# Patient Record
Sex: Female | Born: 1940 | Race: White | Hispanic: No | Smoking: Former smoker
Health system: Southern US, Community
[De-identification: ages and names within clinical notes are randomized; demographics above are authoritative.]

## PROBLEM LIST (undated history)

## (undated) DIAGNOSIS — F329 Major depressive disorder, single episode, unspecified: Secondary | ICD-10-CM

## (undated) DIAGNOSIS — F32A Depression, unspecified: Secondary | ICD-10-CM

## (undated) DIAGNOSIS — B019 Varicella without complication: Secondary | ICD-10-CM

## (undated) DIAGNOSIS — R32 Unspecified urinary incontinence: Secondary | ICD-10-CM

## (undated) DIAGNOSIS — K219 Gastro-esophageal reflux disease without esophagitis: Secondary | ICD-10-CM

## (undated) DIAGNOSIS — E7800 Pure hypercholesterolemia, unspecified: Secondary | ICD-10-CM

## (undated) DIAGNOSIS — M1990 Unspecified osteoarthritis, unspecified site: Secondary | ICD-10-CM

## (undated) DIAGNOSIS — G2581 Restless legs syndrome: Secondary | ICD-10-CM

## (undated) DIAGNOSIS — N390 Urinary tract infection, site not specified: Secondary | ICD-10-CM

## (undated) DIAGNOSIS — G4730 Sleep apnea, unspecified: Secondary | ICD-10-CM

## (undated) HISTORY — DX: Depression, unspecified: F32.A

## (undated) HISTORY — DX: Sleep apnea, unspecified: G47.30

## (undated) HISTORY — DX: Unspecified osteoarthritis, unspecified site: M19.90

## (undated) HISTORY — DX: Restless legs syndrome: G25.81

## (undated) HISTORY — DX: Urinary tract infection, site not specified: N39.0

## (undated) HISTORY — DX: Varicella without complication: B01.9

## (undated) HISTORY — DX: Major depressive disorder, single episode, unspecified: F32.9

## (undated) HISTORY — DX: Pure hypercholesterolemia, unspecified: E78.00

## (undated) HISTORY — DX: Unspecified urinary incontinence: R32

## (undated) HISTORY — DX: Gastro-esophageal reflux disease without esophagitis: K21.9

---
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
## 2004-07-26 LAB — HM PAP SMEAR

## 2010-02-25 HISTORY — PX: COLONOSCOPY: SHX174

## 2011-03-17 LAB — HM COLONOSCOPY

## 2011-07-27 LAB — HM MAMMOGRAPHY

## 2012-12-14 DIAGNOSIS — H2510 Age-related nuclear cataract, unspecified eye: Secondary | ICD-10-CM | POA: Diagnosis not present

## 2012-12-14 DIAGNOSIS — H269 Unspecified cataract: Secondary | ICD-10-CM | POA: Diagnosis not present

## 2012-12-15 DIAGNOSIS — H2510 Age-related nuclear cataract, unspecified eye: Secondary | ICD-10-CM | POA: Diagnosis not present

## 2013-01-04 DIAGNOSIS — H269 Unspecified cataract: Secondary | ICD-10-CM | POA: Diagnosis not present

## 2013-01-04 DIAGNOSIS — H2510 Age-related nuclear cataract, unspecified eye: Secondary | ICD-10-CM | POA: Diagnosis not present

## 2013-01-05 DIAGNOSIS — N390 Urinary tract infection, site not specified: Secondary | ICD-10-CM | POA: Diagnosis not present

## 2013-05-03 ENCOUNTER — Encounter: Payer: Self-pay | Admitting: Family Medicine

## 2013-05-03 ENCOUNTER — Ambulatory Visit (INDEPENDENT_AMBULATORY_CARE_PROVIDER_SITE_OTHER): Payer: Medicare Other | Admitting: Family Medicine

## 2013-05-03 VITALS — BP 150/88 | Temp 98.9°F | Ht 65.75 in | Wt 164.0 lb

## 2013-05-03 DIAGNOSIS — Z7189 Other specified counseling: Secondary | ICD-10-CM

## 2013-05-03 DIAGNOSIS — K219 Gastro-esophageal reflux disease without esophagitis: Secondary | ICD-10-CM | POA: Insufficient documentation

## 2013-05-03 DIAGNOSIS — Z7689 Persons encountering health services in other specified circumstances: Secondary | ICD-10-CM

## 2013-05-03 DIAGNOSIS — G2581 Restless legs syndrome: Secondary | ICD-10-CM | POA: Diagnosis not present

## 2013-05-03 DIAGNOSIS — M25559 Pain in unspecified hip: Secondary | ICD-10-CM

## 2013-05-03 DIAGNOSIS — M25552 Pain in left hip: Secondary | ICD-10-CM

## 2013-05-03 NOTE — Patient Instructions (Addendum)
-  We have ordered an xray of you hip at this visit. It can take up to 1-2 weeks for results and processing. We will contact you with instructions IF your results are abnormal. Normal results will be released to [facility]. If you have not heard from us or can not find your results in [facility] in 2 weeks please contact our office.  -PLEASE SIGN UP FOR MYCHART TODAY   We recommend the following healthy lifestyle measures: - eat a healthy diet consisting of lots of vegetables, fruits, beans, nuts, seeds, healthy meats such as white chicken and fish and whole grains.  - avoid fried foods, fast food, processed foods, sodas, red meet and other fattening foods.  - get a least 150 minutes of aerobic exercise per week.   Follow up in: the next 3 months for a medicare wellness exam and fasting labs that day

## 2013-05-03 NOTE — Progress Notes (Signed)
Pre visit review using our clinic review tool, if applicable. No additional management support is needed unless otherwise documented below in the visit note. 

## 2013-05-03 NOTE — Progress Notes (Signed)
Chief Complaint  Patient presents with  . Establish Care    HPI:  Christina Guerrero is here to establish care. Recently moved here from [facility]. Last PCP and physical:  Has the following chronic problems and concerns today:  Patient Active Problem List   Diagnosis Date Noted  . GERD (gastroesophageal reflux disease) 05/03/2013  . Restless leg 05/03/2013   L Hip Pain: -5 months ago after fell out of bed and hurt immediately after and has persisted though somewhat better -wants xray of this -denies inability to bear weight, weakness, numbness, fevers, malaise, weight loss, bowel or bladder changes  Restless leg syndrome: -ropinirole -stable on this for some time  GERD: -takes chronically -stable as long as on medication  Anxiety and Depression: -wants recs for psych on this -stable on effexor and ativan  Health Maintenance:  ROS: See pertinent positives and negatives per HPI.  Past Medical History  Diagnosis Date  . Arthritis   . Chicken pox   . Depression   . GERD (gastroesophageal reflux disease)   . High cholesterol   . Urine incontinence   . UTI (urinary tract infection)   . Restless leg syndrome   . Sleep apnea     borderline    Family History  Problem Relation Age of Onset  . Hyperlipidemia Mother   . Hypertension Mother   . Mental illness Mother   . Stroke Mother   . Thyroid disease Mother   . Kidney disease Father   . Mental illness Father   . Alcoholism Father   . Hepatitis Father   . Sudden death Father   . Stroke Maternal Grandfather     History   Social History  . Marital Status: Married    Spouse Name: N/A    Number of Children: N/A  . Years of Education: N/A   Social History Main Topics  . Smoking status: Never Smoker   . Smokeless tobacco: None  . Alcohol Use: Yes     Comment: glass of wine once per day  . Drug Use: None  . Sexual Activity: None   Other Topics Concern  . 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 One in am, pm and 2 at bedtime., Disp: , Rfl: ;  omeprazole (PRILOSEC) 20 MG capsule, Take 20 mg by mouth daily., Disp: , Rfl: ;  rOPINIRole (REQUIP) 0.5 MG tablet, Take 0.5 mg by mouth at bedtime. , Disp: , Rfl: ;  venlafaxine XR (EFFEXOR-XR) 150 MG 24 hr capsule, Take 150 mg by mouth 2 (two) times daily. , Disp: , Rfl:   EXAM:  Filed Vitals:   05/03/13 1125  BP: 150/88  Temp: 98.9 F (37.2 C)    Body mass index is 26.67 kg/(m^2).  GENERAL: vitals reviewed and listed above, alert, oriented, appears well hydrated and in no acute distress  HEENT: atraumatic, conjunttiva clear, no obvious abnormalities on inspection of external nose and ears  NECK: no obvious masses on inspection  LUNGS: clear to auscultation bilaterally, no wheezes, rales or rhonchi, good air movement  CV: HRRR, no peripheral edema  MS: moves all extremities without noticeable abnormality, normal  gait, TTP L post GT  PSYCH: pleasant and cooperative, no obvious depression or anxiety  ASSESSMENT AND PLAN:  Discussed the following assessment and plan:  Hip pain, left - Plan: DG Hip Complete Left  Encounter to establish care  GERD (gastroesophageal reflux disease)  Restless leg  -We reviewed the PMH, PSH, FH, SH, Meds and Allergies. -We provided refills for any medications we will prescribe as needed. -We addressed current concerns per orders and patient instructions. -We have asked for records for pertinent exams, studies, vaccines and notes from previous providers. -We have advised patient to follow up per instructions below. -she will see psych for psych medications -xray L hip and offered referral to ortho - pending results -follow up for  physical  -Patient advised to return or notify a doctor immediately if symptoms worsen or persist or new concerns arise.  Patient Instructions  -We have ordered labs or studies at this visit. It can take up to 1-2 weeks for results and processing. We will contact you with instructions IF your results are abnormal. Normal results will be [facility]. If you have not heard from us or can not find your results in [facility] in 2 weeks please contact our office.  -PLEASE SIGN UP FOR MYCHART TODAY   We recommend the following healthy lifestyle measures: - eat a healthy diet consisting of lots of vegetables, fruits, beans, nuts, seeds, healthy meats such as white chicken and fish and whole grains.  - avoid fried foods, fast food, processed foods, sodas, red meet and other fattening foods.  - get a least 150 minutes of aerobic exercise per week.   Follow up in: the next 3 months for a medicare wellness exam and fasting labs that day      Shambhavi Salley, Dahlia ClientHANNAH R.

## 2013-05-07 ENCOUNTER — Ambulatory Visit (INDEPENDENT_AMBULATORY_CARE_PROVIDER_SITE_OTHER)
Admission: RE | Admit: 2013-05-07 | Discharge: 2013-05-07 | Disposition: A | Discharge status: Home / Self Care | Payer: Medicare Other | Source: Ambulatory Visit | Attending: Family Medicine | Admitting: Family Medicine

## 2013-05-07 DIAGNOSIS — M25559 Pain in unspecified hip: Secondary | ICD-10-CM

## 2013-05-07 DIAGNOSIS — S79919A Unspecified injury of unspecified hip, initial encounter: Secondary | ICD-10-CM | POA: Diagnosis not present

## 2013-05-07 DIAGNOSIS — M25552 Pain in left hip: Secondary | ICD-10-CM

## 2013-05-10 ENCOUNTER — Telehealth: Payer: Self-pay | Admitting: Family Medicine

## 2013-05-10 DIAGNOSIS — M25559 Pain in unspecified hip: Secondary | ICD-10-CM

## 2013-05-10 NOTE — Telephone Encounter (Signed)
Noted  

## 2013-05-10 NOTE — Telephone Encounter (Signed)
Message copied by Terressa KoyanagiKIM, Kambre Messner R on Mon May 10, 2013  2:14 PM ------      Message from: Azucena FreedMILLNER, ALISHA C      Created: Mon May 10, 2013  1:57 PM       Called and spoke with pt and pt is aware. Pt is interested in physical therapy. ------

## 2013-05-10 NOTE — Telephone Encounter (Signed)
Order sent for physical therapy for hip pain.

## 2013-05-11 ENCOUNTER — Ambulatory Visit: Payer: Medicare Other | Attending: Family Medicine

## 2013-05-11 DIAGNOSIS — M25559 Pain in unspecified hip: Secondary | ICD-10-CM | POA: Diagnosis not present

## 2013-05-11 DIAGNOSIS — IMO0001 Reserved for inherently not codable concepts without codable children: Secondary | ICD-10-CM | POA: Diagnosis not present

## 2013-05-11 DIAGNOSIS — R5381 Other malaise: Secondary | ICD-10-CM | POA: Diagnosis not present

## 2013-05-12 ENCOUNTER — Ambulatory Visit: Payer: Medicare Other | Admitting: Physical Therapy

## 2013-05-17 ENCOUNTER — Ambulatory Visit: Payer: Medicare Other | Admitting: Physical Therapy

## 2013-05-19 ENCOUNTER — Ambulatory Visit: Payer: Medicare Other | Admitting: Physical Therapy

## 2013-05-21 ENCOUNTER — Ambulatory Visit: Payer: Medicare Other | Admitting: Physical Therapy

## 2013-05-24 ENCOUNTER — Ambulatory Visit: Payer: Medicare Other | Admitting: Physical Therapy

## 2013-05-26 ENCOUNTER — Ambulatory Visit: Payer: Medicare Other | Attending: Family Medicine | Admitting: Physical Therapy

## 2013-05-26 DIAGNOSIS — IMO0001 Reserved for inherently not codable concepts without codable children: Secondary | ICD-10-CM | POA: Insufficient documentation

## 2013-05-26 DIAGNOSIS — M25559 Pain in unspecified hip: Secondary | ICD-10-CM | POA: Diagnosis not present

## 2013-05-26 DIAGNOSIS — R5381 Other malaise: Secondary | ICD-10-CM | POA: Diagnosis not present

## 2013-05-31 ENCOUNTER — Ambulatory Visit: Payer: Medicare Other | Admitting: Physical Therapy

## 2013-06-02 ENCOUNTER — Ambulatory Visit: Payer: Medicare Other | Admitting: Physical Therapy

## 2013-06-04 ENCOUNTER — Ambulatory Visit: Payer: Medicare Other | Admitting: Physical Therapy

## 2013-06-08 ENCOUNTER — Encounter: Payer: Medicare Other | Admitting: Family Medicine

## 2013-06-08 NOTE — Progress Notes (Signed)
error    This encounter was created in error - please disregard.

## 2013-06-09 ENCOUNTER — Ambulatory Visit: Payer: Medicare Other | Admitting: Physical Therapy

## 2013-06-14 ENCOUNTER — Ambulatory Visit: Payer: Medicare Other | Admitting: Physical Therapy

## 2013-06-16 ENCOUNTER — Ambulatory Visit: Payer: Medicare Other | Admitting: Physical Therapy

## 2013-06-21 ENCOUNTER — Ambulatory Visit: Payer: Medicare Other | Admitting: Physical Therapy

## 2013-06-23 ENCOUNTER — Ambulatory Visit: Payer: Medicare Other

## 2013-06-28 ENCOUNTER — Encounter: Payer: Medicare Other | Admitting: Physical Therapy

## 2013-06-30 ENCOUNTER — Encounter: Payer: Medicare Other | Admitting: Physical Therapy

## 2013-07-08 ENCOUNTER — Encounter: Payer: Self-pay | Admitting: Family Medicine

## 2013-07-08 ENCOUNTER — Ambulatory Visit (INDEPENDENT_AMBULATORY_CARE_PROVIDER_SITE_OTHER): Payer: Medicare Other | Admitting: Family Medicine

## 2013-07-08 VITALS — BP 138/80 | HR 65 | Temp 98.9°F | Ht 65.75 in | Wt 158.5 lb

## 2013-07-08 DIAGNOSIS — R7309 Other abnormal glucose: Secondary | ICD-10-CM | POA: Diagnosis not present

## 2013-07-08 DIAGNOSIS — Z0000 Encounter for general adult medical examination without abnormal findings: Secondary | ICD-10-CM

## 2013-07-08 DIAGNOSIS — E785 Hyperlipidemia, unspecified: Secondary | ICD-10-CM

## 2013-07-08 DIAGNOSIS — K219 Gastro-esophageal reflux disease without esophagitis: Secondary | ICD-10-CM

## 2013-07-08 DIAGNOSIS — R739 Hyperglycemia, unspecified: Secondary | ICD-10-CM

## 2013-07-08 LAB — LIPID PANEL
CHOLESTEROL: 282 mg/dL — AB (ref 0–200)
HDL: 56.1 mg/dL (ref >39.00)
LDL Cholesterol: 193 mg/dL — ABNORMAL HIGH (ref 0–99)
Total CHOL/HDL Ratio: 5
Triglycerides: 167 mg/dL — ABNORMAL HIGH (ref 0.0–149.0)
VLDL: 33.4 mg/dL (ref 0.0–40.0)

## 2013-07-08 LAB — HEMOGLOBIN A1C: Hgb A1c MFr Bld: 4.8 % (ref 4.6–6.5)

## 2013-07-08 NOTE — Patient Instructions (Addendum)
-  please call to schedule your mammogram  -vitamin D 1000 IU daily and calcium 600mg  daily  -We have ordered labs or studies at this visit. It can take up to 1-2 weeks for results and processing. We will contact you with instructions IF your results are abnormal. Normal results will be released to your [facility]. If you have not heard from us or can not find your results in [facility] in 2 weeks please contact our office.  We recommend the following healthy lifestyle measures: - eat a healthy diet consisting of lots of vegetables, fruits, beans, nuts, seeds, healthy meats such as white chicken and fish and whole grains.  - avoid fried foods, fast food, processed foods, sodas, red meet and other fattening foods.  - get a least 150 minutes of aerobic exercise per week.   -follow up in 1 year and as needed

## 2013-07-08 NOTE — Progress Notes (Signed)
Pre visit review using our clinic review tool, if applicable. No additional management support is needed unless otherwise documented below in the visit note. 

## 2013-07-08 NOTE — Progress Notes (Signed)
Medicare Annual Preventive Care Visit  (initial annual wellness or annual wellness exam)  Follow up:   L hip pain: -mild OA on xray -referred for PT per her request  RLS: -stable  GERD: -stable  Anxiety and Depression: -followed by psych  1.) Patient-completed health risk assessment  - completed and reviewed, see scanned documentation  2.) Review of Medical History: -PMH, PSH, Family History and current specialty and care providers reviewed and updated and listed below  - see chart and below  3.) Review of functional ability and level of safety:  Any difficulty hearing?  NO  History of falling? NO  Any trouble with IADLs - using a phone, using transportation, grocery shopping, preparing meals, doing housework, doing laundry, taking medications and managing money? YES   Advance Directives? YES   See summary of recommendations in Patient Instructions below.  4.) Physical Exam Filed Vitals:   07/08/13 1131  BP: 138/80  Pulse: 65  Temp: 98.9 F (37.2 C)   Estimated body mass index is 25.78 kg/(m^2) as calculated from the following:   Height as of this encounter: 5' 5.75" (1.67 m).   Weight as of this encounter: 158 lb 8 oz (71.895 kg).  Visual Acuity grossly intact  Mini Cog: 1. Patient instructed to listen carefully and repeat the following: Apple Watch    Penny  2. Clock drawing test was administered: NORMAL     ABNORMAL  3. Recall of three words  Patient Score: NEG    See patient instructions for recommendations.  4)The following written screening schedule of preventive measures were reviewed with assessment and plan made per below, orders and patient instructions:       Alcohol screening: done     Obesity Screening and counseling: done     STI screening: declined     Tobacco Screening: done       Pneumococcal (PPSV23 -one dose after 64, one before if risk factors), influenza yearly and hepatitis B vaccines (if high risk - end stage renal  disease, IV drugs, homosexual men, live in home for mentally retarded, hemophilia receiving factors) ASSESSMENT/PLAN: done      Screening mammograph (yearly if >40) ASSESSMENT/PLAN: 2012 - she will call to schedule      Screening Pap smear/pelvic exam (q2 years) ASSESSMENT/PLAN: N/A      Colorectal cancer screening (FOBT yearly or flex sig q4y or colonoscopy q10y or barium enema q4y) ASSESSMENT/PLAN: UTD      Diabetes outpatient self-management training services ASSESSMENT/PLAN: n/a      Bone mass measurements(covered q2y if indicated - estrogen def, osteoporosis, hyperparathyroid, vertebral abnormalities, osteoporosis or steroids) ASSESSMENT/PLAN:offered, declined      Screening for glaucoma(q1y if high risk - diabetes, FH, AA and > 50 or hispanic and > 65) ASSESSMENT/PLAN: sees eye doctor, no glaucoma      Medical nutritional therapy for individuals with diabetes or renal disease ASSESSMENT/PLAN: n/a      Cardiovascular screening blood tests (lipids q5y) ASSESSMENT/PLAN: doing      Diabetes screening tests ASSESSMENT/PLAN:doing   7.) Summary: -risk factors and conditions per above assessment were discussed and treatment, recommendations and referrals were offered per documentation above and orders and patient instructions.

## 2013-07-21 ENCOUNTER — Ambulatory Visit (INDEPENDENT_AMBULATORY_CARE_PROVIDER_SITE_OTHER): Payer: Medicare Other | Admitting: Family Medicine

## 2013-07-21 ENCOUNTER — Encounter: Payer: Self-pay | Admitting: Family Medicine

## 2013-07-21 VITALS — BP 120/76 | HR 73 | Temp 98.8°F | Ht 65.75 in | Wt 158.5 lb

## 2013-07-21 DIAGNOSIS — R300 Dysuria: Secondary | ICD-10-CM | POA: Diagnosis not present

## 2013-07-21 DIAGNOSIS — N390 Urinary tract infection, site not specified: Secondary | ICD-10-CM

## 2013-07-21 LAB — POCT URINALYSIS DIPSTICK
Nitrite, UA: POSITIVE
PH UA: 5
Spec Grav, UA: 1.015
Urobilinogen, UA: 4

## 2013-07-21 MED ORDER — NITROFURANTOIN MONOHYD MACRO 100 MG PO CAPS: 100.0000 mg | ORAL_CAPSULE | Freq: Two times a day (BID) | ORAL | Status: DC

## 2013-07-21 NOTE — Patient Instructions (Signed)
Urinary Tract Infection  Urinary tract infections (UTIs) can develop anywhere along your urinary tract. Your urinary tract is your body's drainage system for removing wastes and extra water. Your urinary tract includes two kidneys, two ureters, a bladder, and a urethra. Your kidneys are a pair of bean-shaped organs. Each kidney is about the size of your fist. They are located below your ribs, one on each side of your spine.  CAUSES  Infections are caused by microbes, which are microscopic organisms, including fungi, viruses, and bacteria. These organisms are so small that they can only be seen through a microscope. Bacteria are the microbes that most commonly cause UTIs.  SYMPTOMS   Symptoms of UTIs may vary by age and gender of the patient and by the location of the infection. Symptoms in young women typically include a frequent and intense urge to urinate and a painful, burning feeling in the bladder or urethra during urination. Older women and men are more likely to be tired, shaky, and weak and have muscle aches and abdominal pain. A fever may mean the infection is in your kidneys. Other symptoms of a kidney infection include pain in your back or sides below the ribs, nausea, and vomiting.  DIAGNOSIS  To diagnose a UTI, your caregiver will ask you about your symptoms. Your caregiver also will ask to provide a urine sample. The urine sample will be tested for bacteria and white blood cells. White blood cells are made by your body to help fight infection.  TREATMENT   Typically, UTIs can be treated with medication. Because most UTIs are caused by a bacterial infection, they usually can be treated with the use of antibiotics. The choice of antibiotic and length of treatment depend on your symptoms and the type of bacteria causing your infection.  HOME CARE INSTRUCTIONS   If you were prescribed antibiotics, take them exactly as your caregiver instructs you. Finish the medication even if you feel better after you  have only taken some of the medication.   Drink enough water and fluids to keep your urine clear or pale yellow.   Avoid caffeine, tea, and carbonated beverages. They tend to irritate your bladder.   Empty your bladder often. Avoid holding urine for long periods of time.   Empty your bladder before and after sexual intercourse.   After a bowel movement, women should cleanse from front to back. Use each tissue only once.  SEEK MEDICAL CARE IF:    You have back pain.   You develop a fever.   Your symptoms do not begin to resolve within 3 days.  SEEK IMMEDIATE MEDICAL CARE IF:    You have severe back pain or lower abdominal pain.   You develop chills.   You have nausea or vomiting.   You have continued burning or discomfort with urination.  MAKE SURE YOU:    Understand these instructions.   Will watch your condition.   Will get help right away if you are not doing well or get worse.  Document Released: 11/21/2004 Document Revised: 08/13/2011 Document Reviewed: 03/22/2011  [employer] Patient Information 2014 [employer].

## 2013-07-21 NOTE — Progress Notes (Signed)
Pre visit review using our clinic review tool, if applicable. No additional management support is needed unless otherwise documented below in the visit note. 

## 2013-07-21 NOTE — Progress Notes (Signed)
No chief complaint on file.   HPI:  Acute visit for:  1)Dysuria: -started last night -symptoms: dysuria, frequency, urgency -azo has helped -denies: vomiting, nausea, flank pain, fevers -hx of UTI, she is sure this is what that is -reports has taken macrobid for this in the past, last infection over one year ago  ROS: See pertinent positives and negatives per HPI.  Past Medical History  Diagnosis Date  . Arthritis   . Chicken pox   . Depression   . GERD (gastroesophageal reflux disease)   . High cholesterol   . Urine incontinence   . UTI (urinary tract infection)   . Restless leg syndrome   . Sleep apnea     borderline    Past Surgical History  Procedure Laterality Date  . Tonsillectomy and adenoidectomy  1954    Family History  Problem Relation Age of Onset  . Hyperlipidemia Mother   . Hypertension Mother   . Mental illness Mother   . Stroke Mother   . Thyroid disease Mother   . Kidney disease Father   . Mental illness Father   . Alcoholism Father   . Hepatitis Father   . Sudden death Father   . Stroke Maternal Grandfather     History   Social History  . Marital Status: Married    Spouse Name: N/A    Number of Children: N/A  . Years of Education: N/A   Social History Main Topics  . Smoking status: Never Smoker   . Smokeless tobacco: None  . Alcohol Use: Yes     Comment: glass of wine once per day  . Drug Use: None  . Sexual Activity: None   Other Topics Concern  . None   Social History Narrative   Work or School: retired Engineer, drillingadministrative assistant      Home Situation: living with spouse      Spiritual Beliefs: none      Lifestyle: no regular exercise; diet is so so             Current outpatient prescriptions:LORazepam (ATIVAN) 0.5 MG tablet, Take 0.5 mg by mouth. One in am, pm and 2 at bedtime., Disp: , Rfl: ;  omeprazole (PRILOSEC) 20 MG capsule, Take 20 mg by mouth daily., Disp: , Rfl: ;  Phenazopyridine HCl (AZO TABS PO), Take by  mouth., Disp: , Rfl: ;  rOPINIRole (REQUIP) 0.5 MG tablet, Take 0.5 mg by mouth at bedtime. , Disp: , Rfl:  venlafaxine XR (EFFEXOR-XR) 150 MG 24 hr capsule, Take 150 mg by mouth 2 (two) times daily. , Disp: , Rfl:   EXAM:  Filed Vitals:   07/21/13 1011  BP: 120/76  Pulse: 73  Temp: 98.8 F (37.1 C)    Body mass index is 25.78 kg/(m^2).  GENERAL: vitals reviewed and listed above, alert, oriented, appears well hydrated and in no acute distress  HEENT: atraumatic, conjunttiva clear, no obvious abnormalities on inspection of external nose and ears  NECK: no obvious masses on inspection  LUNGS: clear to auscultation bilaterally, no wheezes, rales or rhonchi, good air movement  CV: HRRR, no peripheral edema  MS: moves all extremities without noticeable abnormality  PSYCH: pleasant and cooperative, no obvious depression or anxiety  ASSESSMENT AND PLAN:  Discussed the following assessment and plan:  UTI (lower urinary tract infection)  Dysuria  -udip abn, culture pending, abx risks discussed -Patient advised to return or notify a doctor immediately if symptoms worsen or persist or new concerns arise.  Patient Instructions  Urinary Tract Infection Urinary tract infections (UTIs) can develop anywhere along your urinary tract. Your urinary tract is your body's drainage system for removing wastes and extra water. Your urinary tract includes two kidneys, two ureters, a bladder, and a urethra. Your kidneys are a pair of bean-shaped organs. Each kidney is about the size of your fist. They are located below your ribs, one on each side of your spine. CAUSES Infections are caused by microbes, which are microscopic organisms, including fungi, viruses, and bacteria. These organisms are so small that they can only be seen through a microscope. Bacteria are the microbes that most commonly cause UTIs. SYMPTOMS  Symptoms of UTIs may vary by age and gender of the patient and by the location of  the infection. Symptoms in young women typically include a frequent and intense urge to urinate and a painful, burning feeling in the bladder or urethra during urination. Older women and men are more likely to be tired, shaky, and weak and have muscle aches and abdominal pain. A fever may mean the infection is in your kidneys. Other symptoms of a kidney infection include pain in your back or sides below the ribs, nausea, and vomiting. DIAGNOSIS To diagnose a UTI, your caregiver will ask you about your symptoms. Your caregiver also will ask to provide a urine sample. The urine sample will be tested for bacteria and white blood cells. White blood cells are made by your body to help fight infection. TREATMENT  Typically, UTIs can be treated with medication. Because most UTIs are caused by a bacterial infection, they usually can be treated with the use of antibiotics. The choice of antibiotic and length of treatment depend on your symptoms and the type of bacteria causing your infection. HOME CARE INSTRUCTIONS  If you were prescribed antibiotics, take them exactly as your caregiver instructs you. Finish the medication even if you feel better after you have only taken some of the medication.  Drink enough water and fluids to keep your urine clear or pale yellow.  Avoid caffeine, tea, and carbonated beverages. They tend to irritate your bladder.  Empty your bladder often. Avoid holding urine for long periods of time.  Empty your bladder before and after sexual intercourse.  After a bowel movement, women should cleanse from front to back. Use each tissue only once. SEEK MEDICAL CARE IF:   You have back pain.  You develop a fever.  Your symptoms do not begin to resolve within 3 days. SEEK IMMEDIATE MEDICAL CARE IF:   You have severe back pain or lower abdominal pain.  You develop chills.  You have nausea or vomiting.  You have continued burning or discomfort with urination. MAKE SURE YOU:     Understand these instructions.  Will watch your condition.  Will get help right away if you are not doing well or get worse. Document Released: 11/21/2004 Document Revised: 08/13/2011 Document Reviewed: 03/22/2011 [facility] Patient Information 2014 [place], [place].      [name]

## 2013-07-21 NOTE — Addendum Note (Signed)
Addended by: Johnella Moloney on: 07/21/2013 10:37 AM   Modules accepted: Orders

## 2013-07-24 LAB — CULTURE, URINE COMPREHENSIVE: Colony Count: >=100000

## 2013-08-11 ENCOUNTER — Ambulatory Visit (INDEPENDENT_AMBULATORY_CARE_PROVIDER_SITE_OTHER): Payer: Medicare Other | Admitting: Psychiatry

## 2013-08-11 ENCOUNTER — Encounter (HOSPITAL_COMMUNITY): Payer: Self-pay | Admitting: Psychiatry

## 2013-08-11 VITALS — BP 178/95 | HR 87 | Ht 65.5 in | Wt 157.0 lb

## 2013-08-11 DIAGNOSIS — F329 Major depressive disorder, single episode, unspecified: Secondary | ICD-10-CM | POA: Diagnosis not present

## 2013-08-11 DIAGNOSIS — F3289 Other specified depressive episodes: Secondary | ICD-10-CM | POA: Diagnosis not present
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
## 2013-09-08 ENCOUNTER — Encounter (HOSPITAL_COMMUNITY): Payer: Self-pay | Admitting: Psychiatry

## 2013-09-08 ENCOUNTER — Ambulatory Visit (INDEPENDENT_AMBULATORY_CARE_PROVIDER_SITE_OTHER): Payer: Medicare Other | Admitting: Psychiatry

## 2013-09-08 VITALS — BP 157/80 | HR 72 | Ht 65.0 in | Wt 157.0 lb

## 2013-09-08 DIAGNOSIS — F3289 Other specified depressive episodes: Secondary | ICD-10-CM | POA: Diagnosis not present

## 2013-09-08 DIAGNOSIS — F329 Major depressive disorder, single episode, unspecified: Secondary | ICD-10-CM | POA: Diagnosis not present

## 2013-09-08 MED ORDER — VENLAFAXINE HCL ER 150 MG PO CP24: 150.0000 mg | ORAL_CAPSULE | Freq: Every day | ORAL | Status: DC
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
## 2013-10-01 ENCOUNTER — Ambulatory Visit (INDEPENDENT_AMBULATORY_CARE_PROVIDER_SITE_OTHER): Payer: Medicare Other | Admitting: Psychology

## 2013-10-01 DIAGNOSIS — F329 Major depressive disorder, single episode, unspecified: Secondary | ICD-10-CM

## 2013-10-01 DIAGNOSIS — F3289 Other specified depressive episodes: Secondary | ICD-10-CM

## 2013-10-02 ENCOUNTER — Other Ambulatory Visit (HOSPITAL_COMMUNITY): Payer: Self-pay | Admitting: Psychiatry

## 2013-10-05 ENCOUNTER — Encounter (HOSPITAL_COMMUNITY): Payer: Self-pay | Admitting: Psychology

## 2013-10-05 NOTE — Progress Notes (Signed)
Christina Guerrero is a 73 y.o. female patient referred for counseling by Dr. Lolly Mustache and self.  Patient:   Christina Guerrero   DOB:   1940-09-18  MR Number:  [id number]  Location:  [facility] [place] [id number] [place] [address] Dept: [phone]           Date of Service:   10/01/13  Start Time:   11am End Time:   12pm  Provider/Observer:  Forde Radon [facility]       Billing Code/Service: [phone]  Chief Complaint:     Chief Complaint  Patient presents with  . Depression    Reason for Service:  Pt is seeking counseling for depression.  Patient has a long history of depression that she started tx for in 1989. She moved from [place] 11 months ago as husband retired, son living in the area, husband dx with Parkinson's disease and felt would have good connections to medical community as son is Education officer, community and daughter in law a doctor in the area.  She is now working w/ Dr. Lolly Mustache for medication management. Her recent stressors are husband diagnosed with Parkinson, husband disease is progressing in ways not expected, moved to a new town, limited socialization and limited involvement in the community.  Pt reported that she had great social support in previous community.   She admitted some time anxiety and panic attack but she believe Ativan is helping her. She was to continue her current psychotropic medication.   Current Status:  Pt reports that she is bothered by feeling lack of emotion- not being able to cry.  Pt reports some anxiety and reports loss of interest and feeling isloated as not connected with the community.  Pt reported having anxiety attacks but not to level of panic attack- symptoms include tightening of chest and general feeling of anxiousness.   Reliability of Information: Pt provided information and records of Dr. Lolly Mustache reviewed.   Behavioral Observation: Christina Guerrero   presents as a 73 y.o.-year-old Caucasian Female who appeared her stated age. her dress was Appropriate and she was Well Groomed and her manners were Appropriate to the situation.  There were not any physical disabilities noted.  she displayed an appropriate level of cooperation and motivation.    Interactions:    Active   Attention:   within normal limits  Memory:   within normal limits  Visuo-spatial:   within normal limits  Speech (Volume):  normal  Speech:   normal pitch and normal volume  Thought Process:  Coherent and Relevant  Though Content:  WNL  Orientation:   person, place, time/date and situation  Judgment:   Good  Planning:   Good  Affect:    Appropriate  Mood:    Anxious and Depressed  Insight:   Good  Intelligence:   normal  Marital Status/Living/Social: Pt lives w/ her husband in the [place] community.  Pt was born in [place] and grew up in [place].  Pt and husband have been married for 47 years and they have 3 children married and with grandchildren.  They lived for many years in [place]- husband was an Pensions consultant and worked long hours.  Pt reports she was involved in the community volunteering serving on boards and strong social support. When husband dx w/ Parkinson's they decided they would sell their 3 story house that had many stairs to get to the home and move to [place] for the medical community and  be near their son.  She reports their house sold immediately and they rented till her husband retired last year then made the move.  Pt reports that she is not connected w/ any organizations in the area or neighbors.  Pt extended family lives in [place], husbands in multiple states neither have regular contact with.  Pt strengths include enjoyment in reading and staying connect w/ friends through game "words w/ friends".    Current Employment: Pt not looking for employment.  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    Education:   College  Medical History:   Past Medical History  Diagnosis Date  . Arthritis   . Chicken pox   . Depression   . GERD (gastroesophageal reflux disease)   . High cholesterol   . Urine incontinence   . UTI (urinary tract infection)   . Restless leg syndrome   . Sleep apnea     borderline        Outpatient Encounter Prescriptions as of 10/01/2013  Medication Sig  . LORazepam (ATIVAN) 0.5 MG tablet Take 0.5 mg by mouth. One in am, pm and 2 at bedtime.  Marland Kitchen venlafaxine XR (EFFEXOR-XR) 150 MG 24 hr capsule Take 1 capsule (150 mg total) by mouth daily with breakfast.  . nitrofurantoin, macrocrystal-monohydrate, (MACROBID) 100 MG capsule Take 1 capsule (100 mg total) by mouth 2 (two) times daily.  Marland Kitchen omeprazole (PRILOSEC) 20 MG capsule Take 20 mg by mouth daily.  . Phenazopyridine HCl (AZO TABS PO) Take by mouth.  Marland Kitchen rOPINIRole (REQUIP) 0.5 MG tablet Take 0.5 mg by mouth at bedtime.         Pt taking meds as prescribed.    Sexual History:   History  Sexual Activity  . Sexual Activity: Yes    Abuse/Trauma History: No reported abuse or past trauma.   Psychiatric History:  Pt was in psychiatric care and counseling since 1989 with same providers in [place].  Pt has transitioned psychiatric care to Dr. Lolly Mustache.   Family Med/Psych History:  Family History  Problem Relation Age of Onset  . Hyperlipidemia Mother   . Hypertension Mother   . Mental illness Mother   . Stroke Mother   . Thyroid disease Mother   . Depression Mother   . Kidney disease Father   . Mental illness Father   . Alcoholism Father   . Hepatitis Father   . Sudden death Father   . Alcohol abuse Father   . Stroke Maternal Grandfather     Risk of Suicide/Violence: virtually non-existent not hx of SI or self harm.  Pt no hx of violence.   Impression/DX:  Pt is 73 y/o married female w/ long hx of depressive symptoms and tx since 1989.  Pt reports some improvement w/ tx and feels good  about current medication regimine.  Pt reports increased anxiety recently.  Pt also reports loss of interest.  Pt stressors include life transitions moving to new community, caring for husband w/ Parkinson's and not building a social network in current community.  Pt doesn't have hx of SI, no hx of SA.    Disposition/Plan:  F/u in couple of weeks for individual counseling and continue as scheduled w/ Dr. Lolly Mustache.   Diagnosis:      Depressive disorder, not elsewhere classified              Guerrero,Christina, LPC

## 2013-10-08 ENCOUNTER — Other Ambulatory Visit (HOSPITAL_COMMUNITY): Payer: Self-pay | Admitting: Psychiatry

## 2013-10-12 ENCOUNTER — Other Ambulatory Visit (HOSPITAL_COMMUNITY): Payer: Self-pay | Admitting: Psychiatry

## 2013-10-12 NOTE — Telephone Encounter (Signed)
Never given this much quantity and refills from this office.

## 2013-10-18 ENCOUNTER — Telehealth (HOSPITAL_COMMUNITY): Payer: Self-pay | Admitting: *Deleted

## 2013-10-18 ENCOUNTER — Telehealth (HOSPITAL_COMMUNITY): Payer: Self-pay | Admitting: Psychiatry

## 2013-10-18 ENCOUNTER — Other Ambulatory Visit (HOSPITAL_COMMUNITY): Payer: Self-pay | Admitting: Psychiatry

## 2013-10-18 DIAGNOSIS — F329 Major depressive disorder, single episode, unspecified: Secondary | ICD-10-CM

## 2013-10-18 DIAGNOSIS — F3289 Other specified depressive episodes: Secondary | ICD-10-CM

## 2013-10-18 MED ORDER — LORAZEPAM 0.5 MG PO TABS: 0.5000 mg | ORAL_TABLET | Freq: Two times a day (BID) | ORAL | Status: DC | PRN

## 2013-10-18 NOTE — Telephone Encounter (Signed)
Received VM from patient:  Dr. Lolly Mustache said he would prescribe Lorazepam, but pharmacy said he declined. It helps her sleep and she would like to have it.

## 2013-10-18 NOTE — Telephone Encounter (Signed)
I return phone call and left a message

## 2013-10-20 ENCOUNTER — Encounter (HOSPITAL_COMMUNITY): Payer: Self-pay | Admitting: Psychiatry

## 2013-10-20 ENCOUNTER — Ambulatory Visit (INDEPENDENT_AMBULATORY_CARE_PROVIDER_SITE_OTHER): Payer: Medicare Other | Admitting: Psychiatry

## 2013-10-20 VITALS — BP 152/83 | HR 66 | Ht 65.0 in | Wt 158.4 lb

## 2013-10-20 DIAGNOSIS — F329 Major depressive disorder, single episode, unspecified: Secondary | ICD-10-CM | POA: Diagnosis not present

## 2013-10-20 DIAGNOSIS — F3289 Other specified depressive episodes: Secondary | ICD-10-CM

## 2013-10-20 MED ORDER — VENLAFAXINE HCL ER 150 MG PO CP24: 150.0000 mg | ORAL_CAPSULE | Freq: Every day | ORAL | Status: DC

## 2013-10-20 MED ORDER — LORAZEPAM 0.5 MG PO TABS: 0.5000 mg | ORAL_TABLET | Freq: Two times a day (BID) | ORAL | Status: DC | PRN
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
## 2013-10-21 ENCOUNTER — Ambulatory Visit (INDEPENDENT_AMBULATORY_CARE_PROVIDER_SITE_OTHER): Payer: Medicare Other | Admitting: Psychology

## 2013-10-21 DIAGNOSIS — F3289 Other specified depressive episodes: Secondary | ICD-10-CM | POA: Diagnosis not present

## 2013-10-21 DIAGNOSIS — F329 Major depressive disorder, single episode, unspecified: Secondary | ICD-10-CM

## 2013-10-21 NOTE — Progress Notes (Signed)
   THERAPIST PROGRESS NOTE  Session Time: 11.10am-12pm  Participation Level: Active  Behavioral Response: Well GroomedAlertDepressed  Type of Therapy: Individual Therapy  Treatment Goals addressed: Diagnosis: Depressive D/O NOS and goal 1.  Interventions: CBT and Supportive  Summary: Christina Guerrero "[name]" is a 73 y.o. female who presents with report of depressed mood at times and loneliness.  Pt reports that still adjusting to [facility] and exploring options for meeting others.  Pt discussed how she has found it difficult to connect w/ neighbors and not feeling others extending welcome.  Pt also reported that some barriers of sharing one car w/ husband and not feeling comfortable leaving alone for long time.  Pt does report that she is participating in weekly Pilates and looking to become involved in community.  Pt discussed potential areas for engaging in community and states will make effort.   Suicidal/Homicidal: Nowithout intent/plan  Therapist Response: Assessed pt current functioning per pt report. Explored w/pt engagement and interactions in the community.  Discussed w/ pt her strengths, explored barriers and discussed potential areas for community engagement to build friendships and support system in [place].   Plan: Return again in 3 weeks.  Diagnosis: Axis I: Depressive Disorder NOS    Axis II: No diagnosis    YATES,LEANNE, LPC 10/21/2013

## 2013-10-26 ENCOUNTER — Ambulatory Visit (HOSPITAL_COMMUNITY): Payer: Self-pay | Admitting: Psychiatry

## 2013-11-12 ENCOUNTER — Ambulatory Visit (INDEPENDENT_AMBULATORY_CARE_PROVIDER_SITE_OTHER): Payer: Medicare Other | Admitting: Psychology

## 2013-11-12 DIAGNOSIS — F3289 Other specified depressive episodes: Secondary | ICD-10-CM

## 2013-11-12 DIAGNOSIS — F329 Major depressive disorder, single episode, unspecified: Secondary | ICD-10-CM

## 2013-11-16 ENCOUNTER — Other Ambulatory Visit (HOSPITAL_COMMUNITY): Payer: Self-pay | Admitting: Psychiatry

## 2013-11-17 NOTE — Progress Notes (Signed)
   THERAPIST PROGRESS NOTE  Session Time: 11.05am-11.55am  Participation Level: Active  Behavioral Response: Well GroomedAlertDepressed  Type of Therapy: Individual Therapy  Treatment Goals addressed: Diagnosis: Depressive D/O NOS and goal 1.  Interventions: CBT and Supportive  Summary: Christina Guerrero is a 73 y.o. female who presents with generally full and bright affect in session.  Pt reported that she is still feeling depressed moods and lonely.  Pt reported that she has had some enjoyable outings w/ husband recent.  Pt reported that she hasn't taking initiative to volunteer anywhere.  Pt did discuss potential of becoming more involved in neighborhood association concern although initially dismissed that she would have anything to offer she was able to reframe that w/ time and involvment could.  Pt reported currently she is preparing for visit of her daughter, son in law and granddaughters- which she is looking forward to.  Pt discussed need to further unpack- straighten up to make room- but did make some recent progress when husband gone from house.  Pt still struggling w/ finding social connections- but aware that activities engaged in gives opportunity. .  Suicidal/Homicidal: Nowithout intent/plan  Therapist Response: Assessed Pt current functioning per pt report.  Processed w/ pt involvement in community and encouraged pt to take opportunities for potential social connections and feeling of belonging.  Reflected to pt steps she has taken that indicate progress and discussed need for multiple attempts at times for results.   Plan: Return again in 3 weeks.  Diagnosis: Axis I: Depressive Disorder NOS    Axis II: No diagnosis    YATES,LEANNE, LPC 11/17/2013

## 2013-12-10 ENCOUNTER — Ambulatory Visit (HOSPITAL_COMMUNITY): Payer: Self-pay | Admitting: Psychology

## 2013-12-14 ENCOUNTER — Encounter (HOSPITAL_COMMUNITY): Payer: Self-pay | Admitting: Psychology

## 2014-01-06 ENCOUNTER — Ambulatory Visit (HOSPITAL_COMMUNITY): Payer: Self-pay | Admitting: Psychiatry

## 2014-01-06 ENCOUNTER — Ambulatory Visit (INDEPENDENT_AMBULATORY_CARE_PROVIDER_SITE_OTHER): Payer: Medicare Other | Admitting: Psychiatry

## 2014-01-06 ENCOUNTER — Encounter (HOSPITAL_COMMUNITY): Payer: Self-pay | Admitting: Psychiatry

## 2014-01-06 VITALS — BP 139/80 | HR 60 | Ht 66.0 in | Wt 159.2 lb

## 2014-01-06 DIAGNOSIS — F329 Major depressive disorder, single episode, unspecified: Secondary | ICD-10-CM | POA: Diagnosis not present

## 2014-01-06 DIAGNOSIS — F32A Depression, unspecified: Secondary | ICD-10-CM

## 2014-01-06 MED ORDER — LORAZEPAM 0.5 MG PO TABS: 0.5000 mg | ORAL_TABLET | Freq: Two times a day (BID) | ORAL | Status: DC

## 2014-01-06 MED ORDER — VENLAFAXINE HCL ER 150 MG PO CP24: 150.0000 mg | ORAL_CAPSULE | Freq: Every day | ORAL | Status: DC
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
## 2014-02-04 ENCOUNTER — Ambulatory Visit (INDEPENDENT_AMBULATORY_CARE_PROVIDER_SITE_OTHER): Payer: Medicare Other | Admitting: Psychology

## 2014-02-04 DIAGNOSIS — F329 Major depressive disorder, single episode, unspecified: Secondary | ICD-10-CM | POA: Insufficient documentation

## 2014-02-04 DIAGNOSIS — F32A Depression, unspecified: Secondary | ICD-10-CM

## 2014-02-04 NOTE — Progress Notes (Signed)
   THERAPIST PROGRESS NOTE  Session Time: 2.30pm-3.15pm  Participation Level: Active  Behavioral Response: Well GroomedAlertDepressed  Type of Therapy: Individual Therapy  Treatment Goals addressed: Diagnosis: Depressive D/O NOS and goal 1.  Interventions: CBT and Supportive  Summary: Christina DunCatherine Guerrero is a 73 y.o. female who presents with report of continued depressed mood and little motivation to accomplish things.  Pt reported that she had a good visit w/ daughter in October.  Pt reported that her husband is doing well physically but very forgetful so is limited w/ opportunity to feel time on own that not caretaking.  Pt did take opportunity to enjoy [place] when he husband had appointments today.  Pt reports still feels lonely and wanting friends to share experiences with and talk with.  Pt shared that doesn't feel that counseling is benefiting at this time but agrees to f/u next month.    Suicidal/Homicidal: Nowithout intent/plan  Therapist Response: Assessed pt current functioning per pt report.  Explored w/pt mood and barriers for pt connecting to others in community.  Encouraged pt to find opportunities available.  Discussed pt progress and wants for counseling.    Plan: Return again in 33month.  Provided pt w/ information about [facility].   Diagnosis: Depressive Disorder NOS      YATES,LEANNE, LPC 02/04/2014

## 2014-02-15 ENCOUNTER — Ambulatory Visit: Payer: Self-pay | Admitting: *Deleted

## 2014-02-17 ENCOUNTER — Other Ambulatory Visit (HOSPITAL_COMMUNITY): Payer: Self-pay | Admitting: Psychiatry

## 2014-02-17 NOTE — Telephone Encounter (Signed)
Chart reviewed. Refill not appropriate.

## 2014-02-21 ENCOUNTER — Other Ambulatory Visit (HOSPITAL_COMMUNITY): Payer: Self-pay | Admitting: Psychiatry

## 2014-02-22 ENCOUNTER — Telehealth (HOSPITAL_COMMUNITY): Payer: Self-pay

## 2014-02-22 ENCOUNTER — Other Ambulatory Visit (HOSPITAL_COMMUNITY): Payer: Self-pay | Admitting: Psychiatry

## 2014-02-22 DIAGNOSIS — F330 Major depressive disorder, recurrent, mild: Secondary | ICD-10-CM

## 2014-02-22 MED ORDER — ROPINIROLE HCL 0.5 MG PO TABS: 2.5000 mg | ORAL_TABLET | Freq: Every day | ORAL | Status: DC

## 2014-03-08 ENCOUNTER — Ambulatory Visit (INDEPENDENT_AMBULATORY_CARE_PROVIDER_SITE_OTHER): Payer: Medicare Other | Admitting: Psychology

## 2014-03-08 DIAGNOSIS — F32A Depression, unspecified: Secondary | ICD-10-CM

## 2014-03-08 DIAGNOSIS — F329 Major depressive disorder, single episode, unspecified: Secondary | ICD-10-CM

## 2014-03-08 NOTE — Progress Notes (Signed)
   THERAPIST PROGRESS NOTE  Session Time: 1.30pm-2.12pm  Participation Level: Active  Behavioral Response: Well GroomedAlert, AFFECT WNL  Type of Therapy: Individual Therapy  Treatment Goals addressed: Diagnosis: Depressive D/O NOS and goal 1.  Interventions: CBT and Strength-based  Summary: Christina Guerrero is a 74 y.o. female who presents with affect blunted.  Pt reports not feeling depressed or excitement- just lack or emotion in general. Pt reported that she hasn't f/u on any of the recommendations for joining groups as last discussed. Pt reported that she has started back w/ her Pilates.  Pt reported that she would like to have a support group for caregivers of Parkinson's.  Pt aware that she is lacking connection w/ others locally- some due to not feeling like can leave husband, others lack of searching out good fit. Pt discussed potential of connecting w/ current resource to advocate for local support group.  Pt expressed feeling gained insight she needs and needs to focus herself on making changes/taking action.    Suicidal/Homicidal: Nowithout intent/plan  Therapist Response: Assessed pt current functioning per pt report.  Explored w/pt f/u on connecting w/ community and developing support systems.  Assisted pt in identifying next steps towards this and who could be a resource in the community for a caregiver support group development.  Discussed tx progress and plans for f/u.  Plan: Return again as scheduled w/ Dr. Lolly MustacheArfeen.  Pt to f/u to connect w/ local support group.  Pt can return if needed in 2-3 months for counseling.  If no longer active in 2-3 months then will discharge from counseling.  Diagnosis: Depressive Disorder NOS        Dody Smartt, LPC 03/08/2014

## 2014-04-01 ENCOUNTER — Other Ambulatory Visit (HOSPITAL_COMMUNITY): Payer: Self-pay

## 2014-04-01 ENCOUNTER — Telehealth (HOSPITAL_COMMUNITY): Payer: Self-pay | Admitting: *Deleted

## 2014-04-01 DIAGNOSIS — F330 Major depressive disorder, recurrent, mild: Secondary | ICD-10-CM

## 2014-04-01 MED ORDER — ROPINIROLE HCL 0.5 MG PO TABS: 2.5000 mg | ORAL_TABLET | Freq: Every day | ORAL | Status: DC

## 2014-04-01 NOTE — Telephone Encounter (Signed)
Telephone call with patient to inform of refill of Requip e-scribed in to [facility] [address] today and reminded patient she would need to keep appointment 04/08/14 for additional refills to be prescribed.  Patient agreed with plan and stated would be in to see Dr. Lolly MustacheArfeen on 04/08/14

## 2014-04-01 NOTE — Telephone Encounter (Signed)
[facility] [address].  Record reviewed and one time order completed as patient will be in for next evaluation on 04/08/14.  Medication last filled 02/22/14 and was due for refill.

## 2014-04-08 ENCOUNTER — Ambulatory Visit (INDEPENDENT_AMBULATORY_CARE_PROVIDER_SITE_OTHER): Payer: Medicare Other | Admitting: Psychiatry

## 2014-04-08 ENCOUNTER — Encounter (HOSPITAL_COMMUNITY): Payer: Self-pay | Admitting: Psychiatry

## 2014-04-08 VITALS — BP 142/90 | HR 88 | Ht 66.5 in | Wt 164.0 lb

## 2014-04-08 DIAGNOSIS — F330 Major depressive disorder, recurrent, mild: Secondary | ICD-10-CM

## 2014-04-08 DIAGNOSIS — F32A Depression, unspecified: Secondary | ICD-10-CM

## 2014-04-08 DIAGNOSIS — F329 Major depressive disorder, single episode, unspecified: Secondary | ICD-10-CM | POA: Diagnosis not present

## 2014-04-08 MED ORDER — ROPINIROLE HCL 0.5 MG PO TABS: 2.5000 mg | ORAL_TABLET | Freq: Every day | ORAL | Status: DC

## 2014-04-08 MED ORDER — VENLAFAXINE HCL ER 150 MG PO CP24: 150.0000 mg | ORAL_CAPSULE | Freq: Every day | ORAL | Status: DC

## 2014-04-08 MED ORDER — LORAZEPAM 0.5 MG PO TABS
0.5000 mg | ORAL_TABLET | Freq: Every day | ORAL | Status: DC
Start: 2014-04-08 — End: 2014-07-29
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
## 2014-07-07 ENCOUNTER — Ambulatory Visit (HOSPITAL_COMMUNITY): Payer: Self-pay | Admitting: Psychiatry

## 2014-07-25 ENCOUNTER — Other Ambulatory Visit (HOSPITAL_COMMUNITY): Payer: Self-pay | Admitting: Psychiatry

## 2014-07-27 NOTE — Telephone Encounter (Signed)
Patient no showed for evaluation on 07/13/14 and is rescheduled for 08/12/14.  Last Effexor orders done 04/14/14 plus 2 refills and now in need of new order.

## 2014-07-29 ENCOUNTER — Other Ambulatory Visit (HOSPITAL_COMMUNITY): Payer: Self-pay | Admitting: Psychiatry

## 2014-07-29 DIAGNOSIS — F32A Depression, unspecified: Secondary | ICD-10-CM

## 2014-07-29 DIAGNOSIS — F329 Major depressive disorder, single episode, unspecified: Secondary | ICD-10-CM

## 2014-07-29 MED ORDER — VENLAFAXINE HCL ER 150 MG PO CP24: 150.0000 mg | ORAL_CAPSULE | Freq: Every day | ORAL | Status: DC

## 2014-07-29 NOTE — Telephone Encounter (Signed)
Medication refill request for Lorazepam and Effexor.  Patient no showed for appointment on 07/07/14, last seen 04/08/14 and is now rescheduled for 08/12/14.  Dr. Lolly MustacheArfeen contacted and authorized a one time 30 day supply of patient's Lorazepam and Effexor. E-scribed to [facility] and new 30 day supply order for patient's Effexor XR and called in to [facility] with Almira CoasterGina, pharmacist a new 30 day supply order for patient's prescribed Lorazepam.  Instructed patient would need to keep appointment now set for 08/12/14 for additional refills.

## 2014-08-12 ENCOUNTER — Encounter (HOSPITAL_COMMUNITY): Payer: Self-pay | Admitting: Psychiatry

## 2014-08-12 ENCOUNTER — Ambulatory Visit (INDEPENDENT_AMBULATORY_CARE_PROVIDER_SITE_OTHER): Payer: Medicare Other | Admitting: Psychiatry

## 2014-08-12 VITALS — BP 130/74 | HR 72 | Resp 12 | Wt 161.2 lb

## 2014-08-12 DIAGNOSIS — F330 Major depressive disorder, recurrent, mild: Secondary | ICD-10-CM

## 2014-08-12 DIAGNOSIS — F32A Depression, unspecified: Secondary | ICD-10-CM

## 2014-08-12 DIAGNOSIS — F329 Major depressive disorder, single episode, unspecified: Secondary | ICD-10-CM | POA: Diagnosis not present

## 2014-08-12 MED ORDER — ROPINIROLE HCL 0.5 MG PO TABS: 0.5000 mg | ORAL_TABLET | Freq: Every day | ORAL | Status: DC

## 2014-08-12 MED ORDER — VENLAFAXINE HCL ER 150 MG PO CP24: 150.0000 mg | ORAL_CAPSULE | Freq: Every day | ORAL | Status: DC

## 2014-08-12 MED ORDER — ROPINIROLE HCL 2 MG PO TABS: 2.0000 mg | ORAL_TABLET | Freq: Every day | ORAL | Status: DC

## 2014-08-12 MED ORDER — LORAZEPAM 0.5 MG PO TABS: 0.5000 mg | ORAL_TABLET | Freq: Every day | ORAL | Status: DC
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
## 2014-08-31 ENCOUNTER — Encounter (HOSPITAL_COMMUNITY): Payer: Self-pay | Admitting: Psychology

## 2014-08-31 NOTE — Progress Notes (Signed)
Christina Guerrero is a 73 y.o. female patient discharged from counseling.  Outpatient Therapist Discharge Summary  Dayton Yankowski    04/17/1940   Admission Date: 10/01/13   Discharge Date:  06/07/14 Reason for Discharge:  Met personal goals for counseling Diagnosis:  Depressive D/O NOS    Comments:  Pt will continue w/ Dr. Arfeen.   M           ,, LPC 

## 2014-11-14 ENCOUNTER — Ambulatory Visit (INDEPENDENT_AMBULATORY_CARE_PROVIDER_SITE_OTHER): Payer: Medicare Other | Admitting: Psychiatry

## 2014-11-14 ENCOUNTER — Encounter (HOSPITAL_COMMUNITY): Payer: Self-pay | Admitting: Psychiatry

## 2014-11-14 VITALS — BP 137/80 | HR 80 | Ht 65.0 in | Wt 161.0 lb

## 2014-11-14 DIAGNOSIS — F329 Major depressive disorder, single episode, unspecified: Secondary | ICD-10-CM | POA: Diagnosis not present

## 2014-11-14 DIAGNOSIS — F330 Major depressive disorder, recurrent, mild: Secondary | ICD-10-CM

## 2014-11-14 DIAGNOSIS — F32A Depression, unspecified: Secondary | ICD-10-CM

## 2014-11-14 MED ORDER — ROPINIROLE HCL 2 MG PO TABS: 2.0000 mg | ORAL_TABLET | Freq: Every day | ORAL | Status: DC

## 2014-11-14 MED ORDER — VENLAFAXINE HCL ER 150 MG PO CP24: 150.0000 mg | ORAL_CAPSULE | Freq: Every day | ORAL | Status: DC

## 2014-11-14 MED ORDER — LORAZEPAM 0.5 MG PO TABS: 0.5000 mg | ORAL_TABLET | Freq: Every day | ORAL | Status: DC

## 2014-11-14 MED ORDER — ROPINIROLE HCL 0.5 MG PO TABS: 0.5000 mg | ORAL_TABLET | Freq: Every day | ORAL | Status: DC
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
## 2014-12-23 ENCOUNTER — Encounter: Payer: Self-pay | Admitting: Family Medicine

## 2014-12-23 ENCOUNTER — Other Ambulatory Visit: Payer: Self-pay | Admitting: Family Medicine

## 2014-12-23 ENCOUNTER — Ambulatory Visit (INDEPENDENT_AMBULATORY_CARE_PROVIDER_SITE_OTHER): Payer: Medicare Other | Admitting: Family Medicine

## 2014-12-23 VITALS — BP 130/80 | HR 77 | Temp 98.3°F | Ht 66.0 in | Wt 164.2 lb

## 2014-12-23 DIAGNOSIS — Z23 Encounter for immunization: Secondary | ICD-10-CM | POA: Diagnosis not present

## 2014-12-23 DIAGNOSIS — E785 Hyperlipidemia, unspecified: Secondary | ICD-10-CM

## 2014-12-23 DIAGNOSIS — F331 Major depressive disorder, recurrent, moderate: Secondary | ICD-10-CM | POA: Diagnosis not present

## 2014-12-23 DIAGNOSIS — G2581 Restless legs syndrome: Secondary | ICD-10-CM | POA: Diagnosis not present

## 2014-12-23 DIAGNOSIS — Z0000 Encounter for general adult medical examination without abnormal findings: Secondary | ICD-10-CM | POA: Diagnosis not present

## 2014-12-23 DIAGNOSIS — K219 Gastro-esophageal reflux disease without esophagitis: Secondary | ICD-10-CM

## 2014-12-23 LAB — LIPID PANEL
CHOLESTEROL: 310 mg/dL — AB (ref 0–200)
HDL: 60.9 mg/dL (ref >39.00)
LDL Cholesterol: 215 mg/dL — ABNORMAL HIGH (ref 0–99)
NONHDL: 249.43
TRIGLYCERIDES: 170 mg/dL — AB (ref 0.0–149.0)
Total CHOL/HDL Ratio: 5
VLDL: 34 mg/dL (ref 0.0–40.0)

## 2014-12-23 MED ORDER — PRAVASTATIN SODIUM 40 MG PO TABS: 40.0000 mg | ORAL_TABLET | Freq: Every day | ORAL | Status: DC

## 2014-12-23 NOTE — Progress Notes (Signed)
Medicare Annual Preventive Care Visit  (initial annual wellness or annual wellness exam)  She does not report any change sin her medical history or concerns today. She is seeing psychiatry for MDD and report this is about the same with stable low level of depression mainly related to caring for her husband whom also sees me and has parkinson's disease. She declined treatment for her cholesterol but does wish to recheck. See scanned documentation for further details of her visit.  ROS: negative for report of fevers, unintentional weight loss, vision changes, vision loss, hearing loss or change, chest pain, sob, hemoptysis, melena, hematochezia, hematuria, genital discharge or lesions, falls, bleeding or bruising, loc, thoughts of suicide or self harm, memory loss  1.) Patient-completed health risk assessment  - completed and reviewed, see scanned documentation  2.) Review of Medical History: -PMH, PSH, Family History and current specialty and care providers reviewed and updated and listed below  - see scanned in document in chart and below  Past Medical History  Diagnosis Date  . Arthritis   . Chicken pox   . Depression   . GERD (gastroesophageal reflux disease)   . High cholesterol   . Urine incontinence   . UTI (urinary tract infection)   . Restless leg syndrome   . Sleep apnea     borderline    Past Surgical History  Procedure Laterality Date  . Tonsillectomy and adenoidectomy  1954    Social History   Social History  . Marital Status: Married    Spouse Name: N/A  . Number of Children: N/A  . Years of Education: N/A   Occupational History  . Not on file.   Social History Main Topics  . Smoking status: Former Games developer  . Smokeless tobacco: Not on file  . Alcohol Use: 0.0 oz/week    0 Standard drinks or equivalent per week     Comment: glass of wine once per day  . Drug Use: No  . Sexual Activity: Yes   Other Topics Concern  . 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  NO  History of falling?  NO  Any trouble with IADLs - using a phone, using transportation, grocery shopping, preparing meals, doing housework, doing laundry, taking medications and managing money? NO  Advance Directives? YES - copy requested  See summary of recommendations in Patient Instructions below.  4.) Physical Exam Filed Vitals:   12/23/14 1108  BP: 130/80  Pulse: 77  Temp: 98.3 F (36.8 C)   Estimated body mass index is 26.52 kg/(m^2) as calculated from the following:   Height as of this encounter:  (1.676 m).   Weight as of this encounter: 164 lb 3.2 oz (74.481 kg).  EKG (optional): deferred  General: alert, appear well hydrated and in no acute distress  HEENT: visual acuity grossly intact  CV: HRRR  Lungs: CTA bilaterally  Psych: pleasant and cooperative, no obvious depression or anxiety  Cognitive function grossly intace  See patient instructions for recommendations.  Education and counseling regarding the above review of health provided with  a plan for the following: -see scanned patient completed form for further details -fall prevention strategies discussed  -healthy lifestyle discussed -importance and resources for completing advanced directives discussed -see patient instructions below for any other recommendations provided  4)The following written screening schedule of preventive measures were reviewed with assessment and plan made per below, orders and patient instructions:      AAA screening n/a     Alcohol screening done     Obesity Screening and counseling done     STI screening (Hep C if born 1945-65) ceclined      Tobacco Screening done        Pneumococcal (PPSV23 -one dose after 64, one before if risk factors), influenza yearly and hepatitis B vaccines (if high risk - end stage renal disease, IV drugs, homosexual men, live in home for mentally retarded, hemophilia receiving factors) ASSESSMENT/PLAN: prevnar 13 today      Screening mammograph (yearly if >40) ASSESSMENT/PLAN: advised to schedule      Screening Pap smear/pelvic exam (q2 years) ASSESSMENT/PLAN: n/a      Prostate cancer screening ASSESSMENT/PLAN: n/a      Colorectal cancer screening (FOBT yearly or flex sig q4y or colonoscopy q10y or barium enema q4y) ASSESSMENT/PLAN: done      Diabetes outpatient self-management training services ASSESSMENT/PLAN: n/a      Bone mass measurements(covered q2y if indicated - estrogen def, osteoporosis, hyperparathyroid, vertebral abnormalities, osteoporosis or steroids) ASSESSMENT/PLAN: declined      Screening for glaucoma(q1y if high risk - diabetes, FH, AA and > 50 or hispanic and > 65) ASSESSMENT/PLAN: done      Medical nutritional therapy for individuals with diabetes or renal disease ASSESSMENT/PLAN: n/a      Cardiovascular screening blood tests (lipids q5y) ASSESSMENT/PLAN: recheck today      Diabetes screening tests ASSESSMENT/PLAN: done   7.) Summary: -risk factors and conditions per above assessment were discussed and treatment, recommendations and referrals were offered per documentation above and orders and patient instructions.  Medicare annual wellness visit, subsequent  Moderate episode of recurrent major depressive disorder (HCC)  Gastroesophageal reflux disease, esophagitis presence not specified  Restless leg  Hyperlipemia - Plan: Lipid Panel  Patient Instructions  BEFORE YOU LEAVE: -flu and prevnar 13 vaccines -lab -schedule annual physical in 1 year  We recommend the following healthy lifestyle measures: - eat a healthy whole foods diet consisting of regular  small meals composed of vegetables, fruits, beans, nuts, seeds, healthy meats such as white chicken and fish and whole grains.  - avoid sweets, white starchy foods, fried foods, fast food, processed foods, sodas, red meet and other fattening foods.  - get a least 150-300 minutes of aerobic exercise per week.   -We have ordered labs or studies at this visit. It can take up to 1-2 weeks for results and processing. We will contact you with instructions IF your results are abnormal. Normal results will be released to your [facility]. If you have not heard from us or can not find your results [facility] in 2 weeks please contact our office.

## 2014-12-23 NOTE — Patient Instructions (Addendum)
BEFORE YOU LEAVE: -flu and prevnar 13 vaccines -lab -schedule annual physical in 1 year  We recommend the following healthy lifestyle measures: - eat a healthy whole foods diet consisting of regular small meals composed of vegetables, fruits, beans, nuts, seeds, healthy meats such as white chicken and fish and whole grains.  - avoid sweets, white starchy foods, fried foods, fast food, processed foods, sodas, red meet and other fattening foods.  - get a least 150-300 minutes of aerobic exercise per week.   -We have ordered labs or studies at this visit. It can take up to 1-2 weeks for results and processing. We will contact you with instructions IF your results are abnormal. Normal results will be released to your [facility]. If you have not heard from us or can not find your results in [facility] in 2 weeks please contact our office.  Please schedule your mammogram

## 2014-12-23 NOTE — Addendum Note (Signed)
Addended by: Johnella MoloneyFUNDERBURK, Derral Colucci A on: 12/23/2014 11:53 AM   Modules accepted: Orders

## 2014-12-23 NOTE — Progress Notes (Signed)
Pre visit review using our clinic review tool, if applicable. No additional management support is needed unless otherwise documented below in the visit note. 

## 2015-01-25 ENCOUNTER — Encounter: Payer: Self-pay | Admitting: Family Medicine

## 2015-01-25 ENCOUNTER — Ambulatory Visit (INDEPENDENT_AMBULATORY_CARE_PROVIDER_SITE_OTHER): Payer: Medicare Other | Admitting: Family Medicine

## 2015-01-25 VITALS — BP 140/70 | HR 81 | Temp 99.3°F | Wt 161.0 lb

## 2015-01-25 DIAGNOSIS — J0110 Acute frontal sinusitis, unspecified: Secondary | ICD-10-CM

## 2015-01-25 DIAGNOSIS — H66001 Acute suppurative otitis media without spontaneous rupture of ear drum, right ear: Secondary | ICD-10-CM

## 2015-01-25 MED ORDER — AMOXICILLIN-POT CLAVULANATE 875-125 MG PO TABS: 1.0000 | ORAL_TABLET | Freq: Two times a day (BID) | ORAL | Status: DC

## 2015-01-25 NOTE — Progress Notes (Signed)
PCP: Terressa KoyanagiKIM, HANNAH R., DO  Subjective:  Christina Guerrero is a 74 y.o. year old very pleasant female patient who presents with sinusitis symptoms including nasal congestion, sinus tenderness, cough occasionally productive -other symptoms include: moderate to severe right ear pain starting within last 48 hours.  -day of illness: 9 -started: last Monday or Tuesday.  She flew out of town on Tuesday and symptoms worsened. Usually tries to "tough it out" but then symptoms presented in R ear and worsened through plane flight home yesterday. Does have some left ear pain but minimal -Symptoms show no change except new symptom of right ear pain -previous treatments: tylenol, ibuprofen for sinus pressure and pain -sick contacts/travel/risks: denies flu exposure.  -Hx of: allergies  ROS-denies SOB, NVD, tooth pain with chewing. States low grade temperature elevations but nothing over 100.5  Pertinent Past Medical History-  Patient Active Problem List   Diagnosis Date Noted  . Depressive disorder 02/04/2014  . GERD (gastroesophageal reflux disease) 05/03/2013  . Restless leg 05/03/2013    Medications- reviewed  Current Outpatient Prescriptions  Medication Sig Dispense Refill  . LORazepam (ATIVAN) 0.5 MG tablet Take 1 tablet (0.5 mg total) by mouth at bedtime. 30 tablet 2  . omeprazole (PRILOSEC) 20 MG capsule Take 20 mg by mouth daily.    . pravastatin (PRAVACHOL) 40 MG tablet Take 1 tablet (40 mg total) by mouth daily. 90 tablet 3  . venlafaxine XR (EFFEXOR-XR) 150 MG 24 hr capsule Take 1 capsule (150 mg total) by mouth daily with breakfast. 90 capsule 0  . amoxicillin-clavulanate (AUGMENTIN) 875-125 MG tablet Take 1 tablet by mouth 2 (two) times daily. 14 tablet 0  . rOPINIRole (REQUIP) 0.5 MG tablet Take 1 tablet (0.5 mg total) by mouth at bedtime. (Patient not taking: Reported on 01/25/2015) 90 tablet 0  . rOPINIRole (REQUIP) 2 MG tablet Take 1 tablet (2 mg total) by mouth at bedtime. (Patient  not taking: Reported on 01/25/2015) 90 tablet 0   No current facility-administered medications for this visit.    Objective: BP 140/70 mmHg  Pulse 81  Temp(Src) 99.3 F (37.4 C)  Wt 161 lb (73.029 kg)  SpO2 94% Gen: NAD, resting comfortably HEENT: Turbinates erythematous with yellow drainage, TM normal on left. On right erythematous TM particularly around malleus, pharynx mildly erythematous with no tonsilar exudate or edema, maxillary and frontal sinus tenderness bilaterally.  CV: RRR no murmurs rubs or gallops Lungs: CTAB no crackles, wheeze, rhonchi Abdomen: soft/nontender/nondistended/normal bowel sounds. No rebound or guarding.  Ext: no edema Skin: warm, dry, no rash Neuro: grossly normal, moves all extremities  Assessment/Plan:  Sinsusitis leading to right otitis media Given day 9 of symptoms for sinusitis and not improving- suspect bacterial. In addition, with right otitis media- will use augmentin to cover both instead of just amoxicillin.   Treatment: -other symptomatic care with tylenol or ibuprofen for pain -Antibiotic indicated: yes, 7 days of Augmentin  Finally, we reviewed reasons to return to care including if symptoms worsen or persist or new concerns arise.See Avs  Meds ordered this encounter  Medications  . amoxicillin-clavulanate (AUGMENTIN) 875-125 MG tablet    Sig: Take 1 tablet by mouth 2 (two) times daily.    Dispense:  14 tablet    Refill:  0

## 2015-01-25 NOTE — Patient Instructions (Signed)
Suspect bacterial sinusitis based on 9 days of symptoms and not improving You have a right ear infection that has developed on top of this  Augmentin should cover both and you should be improving within 1-2 days. Follow up with Dr. Selena BattenKim on Friday or Monday if not improving or if symptoms worsen.

## 2015-02-14 ENCOUNTER — Other Ambulatory Visit (HOSPITAL_COMMUNITY): Payer: Self-pay | Admitting: Psychiatry

## 2015-02-14 DIAGNOSIS — F32A Depression, unspecified: Secondary | ICD-10-CM

## 2015-02-14 DIAGNOSIS — F329 Major depressive disorder, single episode, unspecified: Secondary | ICD-10-CM

## 2015-02-15 MED ORDER — ROPINIROLE HCL 0.5 MG PO TABS: 0.5000 mg | ORAL_TABLET | Freq: Every day | ORAL | Status: DC

## 2015-02-15 MED ORDER — VENLAFAXINE HCL ER 150 MG PO CP24: 150.0000 mg | ORAL_CAPSULE | Freq: Every day | ORAL | Status: DC

## 2015-02-15 NOTE — Telephone Encounter (Signed)
Met with Dr.Arfeen to review refill request for Requip 2 mg.  Dr. Adele Schilder authorized this refill and also stated to send one for patient's 0.89m order of Requip and Venlafaxine XR 1584mall for 90 day supplies as patient returns for next evaluation on 02/28/15.  [facility] by Dr. ArAdele Schilder

## 2015-02-22 ENCOUNTER — Encounter: Payer: Self-pay | Admitting: *Deleted

## 2015-02-23 ENCOUNTER — Other Ambulatory Visit (HOSPITAL_COMMUNITY): Payer: Self-pay | Admitting: Psychiatry

## 2015-02-24 ENCOUNTER — Telehealth: Payer: Self-pay | Admitting: Family Medicine

## 2015-02-24 ENCOUNTER — Ambulatory Visit (INDEPENDENT_AMBULATORY_CARE_PROVIDER_SITE_OTHER): Payer: Medicare Other | Admitting: Family Medicine

## 2015-02-24 ENCOUNTER — Encounter: Payer: Self-pay | Admitting: Family Medicine

## 2015-02-24 VITALS — BP 138/78 | HR 72 | Temp 98.4°F | Ht 66.0 in | Wt 162.3 lb

## 2015-02-24 DIAGNOSIS — J069 Acute upper respiratory infection, unspecified: Secondary | ICD-10-CM | POA: Diagnosis not present

## 2015-02-24 MED ORDER — DOXYCYCLINE HYCLATE 100 MG PO TABS: 100.0000 mg | ORAL_TABLET | Freq: Two times a day (BID) | ORAL | Status: DC

## 2015-02-24 MED ORDER — BENZONATATE 100 MG PO CAPS: 100.0000 mg | ORAL_CAPSULE | Freq: Three times a day (TID) | ORAL | Status: DC | PRN

## 2015-02-24 MED ORDER — PREDNISONE 10 MG PO TABS: ORAL_TABLET | ORAL | Status: DC

## 2015-02-24 NOTE — Telephone Encounter (Signed)
Ms. Christina Guerrero called saying she's had a cold since Thanksgiving and she now has a cough that's in her chest and won't go away. She coughs continuously. She'd like to be worked in today if possible. She doesn't want to go to an Urgent Care. Please give her a call.  Pt's ph# [phone] Thank you.

## 2015-02-24 NOTE — Progress Notes (Signed)
Pre visit review using our clinic review tool, if applicable. No additional management support is needed unless otherwise documented below in the visit note. 

## 2015-02-24 NOTE — Progress Notes (Signed)
HPI:  Christina Guerrero is a pleasant 74 yo with a history significant for GERD here for an acute visit for:  Cough -started: 1 month ago with a sinus infection -sinus infection resolved initially with augmentin but the cough has persisted and then devoloped thick nasal congestion and sinus pain again along with fatigue  -denies:fever, SOB, NVD, wheezing -sick contacts/travel/risks: denies flu exposure, tick exposure or or Ebola risks -Hx of: GERD - she reports this is well controlled  ROS: See pertinent positives and negatives per HPI.  Past Medical History  Diagnosis Date  . Arthritis   . Chicken pox   . Depression   . GERD (gastroesophageal reflux disease)   . High cholesterol   . Urine incontinence   . UTI (urinary tract infection)   . Restless leg syndrome   . Sleep apnea     borderline    Past Surgical History  Procedure Laterality Date  . Tonsillectomy and adenoidectomy  1954    Family History  Problem Relation Age of Onset  . Hyperlipidemia Mother   . Hypertension Mother   . Mental illness Mother   . Stroke Mother   . Thyroid disease Mother   . Depression Mother   . Kidney disease Father   . Mental illness Father   . Alcoholism Father   . Hepatitis Father   . Sudden death Father   . Alcohol abuse Father   . Stroke Maternal Grandfather     Social History   Social History  . Marital Status: Married    Spouse Name: N/A  . Number of Children: N/A  . Years of Education: N/A   Social History Main Topics  . Smoking status: Former Games developermoker  . Smokeless tobacco: None  . Alcohol Use: 0.0 oz/week    0 Standard drinks or equivalent per week     Comment: glass of wine once per day  . Drug Use: No  . Sexual Activity: Yes   Other Topics Concern  . None   Social History Narrative   Work or School: retired Engineer, drillingadministrative assistant      Home Situation: living with spouse      Spiritual Beliefs: none      Lifestyle: no regular exercise; diet is so  so              Current outpatient prescriptions:  .  LORazepam (ATIVAN) 0.5 MG tablet, Take 1 tablet (0.5 mg total) by mouth at bedtime., Disp: 30 tablet, Rfl: 2 .  omeprazole (PRILOSEC) 20 MG capsule, Take 20 mg by mouth daily., Disp: , Rfl:  .  pravastatin (PRAVACHOL) 40 MG tablet, Take 1 tablet (40 mg total) by mouth daily., Disp: 90 tablet, Rfl: 3 .  rOPINIRole (REQUIP) 0.5 MG tablet, Take 1 tablet (0.5 mg total) by mouth at bedtime., Disp: 90 tablet, Rfl: 0 .  rOPINIRole (REQUIP) 2 MG tablet, take 1 tablet by mouth at bedtime, Disp: 90 tablet, Rfl: 0 .  venlafaxine XR (EFFEXOR-XR) 150 MG 24 hr capsule, Take 1 capsule (150 mg total) by mouth daily with breakfast., Disp: 90 capsule, Rfl: 0 .  benzonatate (TESSALON PERLES) 100 MG capsule, Take 1 capsule (100 mg total) by mouth 3 (three) times daily as needed for cough., Disp: 20 capsule, Rfl: 0 .  doxycycline (VIBRA-TABS) 100 MG tablet, Take 1 tablet (100 mg total) by mouth 2 (two) times daily., Disp: 20 tablet, Rfl: 0 .  predniSONE (DELTASONE) 10 MG tablet, 40mg  (4 tabs) daily for 2 days,  then (3 tabs) daily for 2 days, then  (2 tabs) daily for 2 days, then  (1 tab) daily for 2 days, Disp: 20 tablet, Rfl: 0  EXAM:  Filed Vitals:   02/24/15 1057  BP: 138/78  Pulse: 72  Temp: 98.4 F (36.9 C)    Body mass index is 26.21 kg/(m^2).  GENERAL: vitals reviewed and listed above, alert, oriented, appears well hydrated and in no acute distress  HEENT: atraumatic, conjunttiva clear, no obvious abnormalities on inspection of external nose and ears, normal appearance of ear canals and TMs, thick nasal congestion, mild post oropharyngeal erythema with PND, no tonsillar edema or exudate, R max sinus TTP  NECK: no obvious masses on inspection  LUNGS: clear to auscultation bilaterally, no wheezes, rales or rhonchi, good air movement  CV: HRRR, no peripheral edema  MS: moves all extremities without noticeable  abnormality  PSYCH: pleasant and cooperative, no obvious depression or anxiety  ASSESSMENT AND PLAN:  Discussed the following assessment and plan:  Acute upper respiratory infection  - We discussed potential etiologies, with partially treated sinusitis and possible bronchitis being most likely. We discussed treatment side effects, likely course, antibiotic misuse, transmission, and signs of developing a serious illness. Opted for doxy and prednisone taper with cough medication. ENT eval if persistent symptoms. -of course, we advised to return or notify a doctor immediately if symptoms worsen or persist or new concerns arise.    Patient Instructions  Take the medications as prescribed.  I do hope you feel better soon.  If worsening or sinus issues persist I do think it would be a good idea to see the Ear, Nose and throat specialist.       Kriste Basque R.

## 2015-02-24 NOTE — Patient Instructions (Signed)
Take the medications as prescribed.  I do hope you feel better soon.  If worsening or sinus issues persist I do think it would be a good idea to see the Ear, Nose and throat specialist.

## 2015-02-24 NOTE — Telephone Encounter (Signed)
Per Dr Selena BattenKim she can work the pt in today at 10:45am.  I informed the pt of this and she stated she will be here and Marchelle FolksAmanda scheduled the appt.

## 2015-02-26 HISTORY — PX: APPENDECTOMY: SHX54

## 2015-02-28 ENCOUNTER — Encounter (HOSPITAL_COMMUNITY): Payer: Self-pay | Admitting: Psychiatry

## 2015-02-28 ENCOUNTER — Ambulatory Visit (INDEPENDENT_AMBULATORY_CARE_PROVIDER_SITE_OTHER): Payer: Medicare Other | Admitting: Psychiatry

## 2015-02-28 DIAGNOSIS — F329 Major depressive disorder, single episode, unspecified: Secondary | ICD-10-CM

## 2015-02-28 DIAGNOSIS — F32A Depression, unspecified: Secondary | ICD-10-CM

## 2015-02-28 MED ORDER — LORAZEPAM 0.5 MG PO TABS: 0.5000 mg | ORAL_TABLET | Freq: Every day | ORAL | Status: DC
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
## 2015-03-27 ENCOUNTER — Encounter (HOSPITAL_COMMUNITY): Payer: Self-pay | Admitting: Emergency Medicine

## 2015-03-27 ENCOUNTER — Observation Stay (HOSPITAL_COMMUNITY)
Admission: EM | Admit: 2015-03-27 | Discharge: 2015-03-29 | Disposition: A | Discharge status: Home / Self Care | Payer: Medicare Other | Attending: General Surgery | Admitting: General Surgery

## 2015-03-27 ENCOUNTER — Encounter (HOSPITAL_COMMUNITY)
Admission: EM | Disposition: A | Discharge status: Home / Self Care | Payer: Self-pay | Source: Home / Self Care | Attending: Emergency Medicine

## 2015-03-27 ENCOUNTER — Encounter (HOSPITAL_COMMUNITY): Payer: Self-pay | Admitting: Clinical

## 2015-03-27 ENCOUNTER — Ambulatory Visit (INDEPENDENT_AMBULATORY_CARE_PROVIDER_SITE_OTHER): Payer: Medicare Other | Admitting: Clinical

## 2015-03-27 ENCOUNTER — Emergency Department (HOSPITAL_COMMUNITY): Payer: Medicare Other | Admitting: Certified Registered Nurse Anesthetist

## 2015-03-27 ENCOUNTER — Telehealth: Payer: Self-pay | Admitting: Family Medicine

## 2015-03-27 ENCOUNTER — Emergency Department (HOSPITAL_COMMUNITY): Payer: Medicare Other

## 2015-03-27 DIAGNOSIS — Z01811 Encounter for preprocedural respiratory examination: Secondary | ICD-10-CM | POA: Diagnosis not present

## 2015-03-27 DIAGNOSIS — M1990 Unspecified osteoarthritis, unspecified site: Secondary | ICD-10-CM | POA: Diagnosis not present

## 2015-03-27 DIAGNOSIS — K219 Gastro-esophageal reflux disease without esophagitis: Secondary | ICD-10-CM | POA: Insufficient documentation

## 2015-03-27 DIAGNOSIS — G2581 Restless legs syndrome: Secondary | ICD-10-CM | POA: Insufficient documentation

## 2015-03-27 DIAGNOSIS — K3589 Other acute appendicitis without perforation or gangrene: Secondary | ICD-10-CM

## 2015-03-27 DIAGNOSIS — E7800 Pure hypercholesterolemia, unspecified: Secondary | ICD-10-CM | POA: Insufficient documentation

## 2015-03-27 DIAGNOSIS — Z9049 Acquired absence of other specified parts of digestive tract: Secondary | ICD-10-CM

## 2015-03-27 DIAGNOSIS — K3580 Unspecified acute appendicitis: Secondary | ICD-10-CM | POA: Diagnosis not present

## 2015-03-27 DIAGNOSIS — R1031 Right lower quadrant pain: Secondary | ICD-10-CM | POA: Diagnosis not present

## 2015-03-27 DIAGNOSIS — F329 Major depressive disorder, single episode, unspecified: Secondary | ICD-10-CM | POA: Diagnosis not present

## 2015-03-27 DIAGNOSIS — F331 Major depressive disorder, recurrent, moderate: Secondary | ICD-10-CM | POA: Diagnosis not present

## 2015-03-27 DIAGNOSIS — Z87891 Personal history of nicotine dependence: Secondary | ICD-10-CM | POA: Diagnosis not present

## 2015-03-27 DIAGNOSIS — G4730 Sleep apnea, unspecified: Secondary | ICD-10-CM | POA: Insufficient documentation

## 2015-03-27 HISTORY — PX: LAPAROSCOPIC APPENDECTOMY: SHX408

## 2015-03-27 HISTORY — DX: Acquired absence of other specified parts of digestive tract: Z90.49

## 2015-03-27 LAB — COMPREHENSIVE METABOLIC PANEL
ALBUMIN: 3.8 g/dL (ref 3.5–5.0)
ALK PHOS: 119 U/L (ref 38–126)
ALT: 20 U/L (ref 14–54)
AST: 19 U/L (ref 15–41)
Anion gap: 12 (ref 5–15)
BUN: 8 mg/dL (ref 6–20)
CALCIUM: 9.7 mg/dL (ref 8.9–10.3)
CHLORIDE: 101 mmol/L (ref 101–111)
CO2: 25 mmol/L (ref 22–32)
CREATININE: 0.84 mg/dL (ref 0.44–1.00)
GFR calc Af Amer: >60 mL/min (ref >60)
GFR calc non Af Amer: >60 mL/min (ref >60)
GLUCOSE: 112 mg/dL — AB (ref 65–99)
Potassium: 4 mmol/L (ref 3.5–5.1)
SODIUM: 138 mmol/L (ref 135–145)
Total Bilirubin: 1.6 mg/dL — ABNORMAL HIGH (ref 0.3–1.2)
Total Protein: 6.7 g/dL (ref 6.5–8.1)

## 2015-03-27 LAB — CBC WITH DIFFERENTIAL/PLATELET
BASOS ABS: 0 10*3/uL (ref 0.0–0.1)
BASOS PCT: 0 %
EOS PCT: 0 %
Eosinophils Absolute: 0 10*3/uL (ref 0.0–0.7)
HCT: 47 % — ABNORMAL HIGH (ref 36.0–46.0)
Hemoglobin: 15.7 g/dL — ABNORMAL HIGH (ref 12.0–15.0)
Lymphocytes Relative: 14 %
Lymphs Abs: 1.4 10*3/uL (ref 0.7–4.0)
MCH: 33.2 pg (ref 26.0–34.0)
MCHC: 33.4 g/dL (ref 30.0–36.0)
MCV: 99.4 fL (ref 78.0–100.0)
MONO ABS: 0.6 10*3/uL (ref 0.1–1.0)
Monocytes Relative: 6 %
NEUTROS ABS: 8.4 10*3/uL — AB (ref 1.7–7.7)
Neutrophils Relative %: 80 %
PLATELETS: 188 10*3/uL (ref 150–400)
RBC: 4.73 MIL/uL (ref 3.87–5.11)
RDW: 13.1 % (ref 11.5–15.5)
WBC: 10.5 10*3/uL (ref 4.0–10.5)

## 2015-03-27 LAB — LIPASE, BLOOD: Lipase: 20 U/L (ref 11–51)

## 2015-03-27 LAB — PROTIME-INR
INR: 1.05 (ref 0.00–1.49)
Prothrombin Time: 13.9 seconds (ref 11.6–15.2)

## 2015-03-27 LAB — I-STAT CG4 LACTIC ACID, ED
LACTIC ACID, VENOUS: 1.18 mmol/L (ref 0.5–2.0)
LACTIC ACID, VENOUS: 1.17 mmol/L (ref 0.5–2.0)

## 2015-03-27 LAB — APTT: aPTT: 35 seconds (ref 24–37)

## 2015-03-27 SURGERY — APPENDECTOMY, LAPAROSCOPIC
Anesthesia: General

## 2015-03-27 MED ORDER — EPHEDRINE SULFATE 50 MG/ML IJ SOLN
INTRAMUSCULAR | Status: DC | PRN
  Administered 2015-03-27: 10 mg via INTRAVENOUS

## 2015-03-27 MED ORDER — METHOCARBAMOL 500 MG PO TABS: 500.0000 mg | ORAL_TABLET | Freq: Four times a day (QID) | ORAL | Status: DC | PRN

## 2015-03-27 MED ORDER — SODIUM CHLORIDE 0.9 % IV SOLN
3.0000 g | Freq: Once | INTRAVENOUS | Status: AC
  Administered 2015-03-27: 3 g via INTRAVENOUS
  Filled 2015-03-27: qty 3

## 2015-03-27 MED ORDER — ROPINIROLE HCL 0.5 MG PO TABS
0.5000 mg | ORAL_TABLET | Freq: Every day | ORAL | Status: DC
  Administered 2015-03-27 – 2015-03-28 (×2): 0.5 mg via ORAL
  Filled 2015-03-27 (×2): qty 1

## 2015-03-27 MED ORDER — PROPOFOL 10 MG/ML IV BOLUS
INTRAVENOUS | Status: DC | PRN
  Administered 2015-03-27: 150 mg via INTRAVENOUS

## 2015-03-27 MED ORDER — MIDAZOLAM HCL 2 MG/2ML IJ SOLN
INTRAMUSCULAR | Status: AC
  Filled 2015-03-27: qty 2

## 2015-03-27 MED ORDER — SODIUM CHLORIDE 0.9 % IV SOLN
INTRAVENOUS | Status: DC
  Administered 2015-03-27 – 2015-03-28 (×2): via INTRAVENOUS

## 2015-03-27 MED ORDER — BUPIVACAINE-EPINEPHRINE 0.25% -1:200000 IJ SOLN
INTRAMUSCULAR | Status: DC | PRN
  Administered 2015-03-27: 10 mL

## 2015-03-27 MED ORDER — FENTANYL CITRATE (PF) 100 MCG/2ML IJ SOLN
INTRAMUSCULAR | Status: DC | PRN
  Administered 2015-03-27: 50 ug via INTRAVENOUS

## 2015-03-27 MED ORDER — ONDANSETRON HCL 4 MG/2ML IJ SOLN: 4.0000 mg | Freq: Four times a day (QID) | INTRAMUSCULAR | Status: DC | PRN

## 2015-03-27 MED ORDER — LACTATED RINGERS IV SOLN
INTRAVENOUS | Status: DC | PRN
  Administered 2015-03-27: 20:00:00 via INTRAVENOUS

## 2015-03-27 MED ORDER — FENTANYL CITRATE (PF) 100 MCG/2ML IJ SOLN
50.0000 ug | Freq: Once | INTRAMUSCULAR | Status: AC
  Administered 2015-03-27: 50 ug via INTRAVENOUS
  Filled 2015-03-27: qty 2

## 2015-03-27 MED ORDER — ENOXAPARIN SODIUM 40 MG/0.4ML ~~LOC~~ SOLN
40.0000 mg | SUBCUTANEOUS | Status: DC
  Administered 2015-03-27 – 2015-03-28 (×2): 40 mg via SUBCUTANEOUS
  Filled 2015-03-27 (×2): qty 0.4

## 2015-03-27 MED ORDER — VENLAFAXINE HCL ER 75 MG PO CP24
150.0000 mg | ORAL_CAPSULE | Freq: Every day | ORAL | Status: DC
  Administered 2015-03-28 – 2015-03-29 (×2): 150 mg via ORAL
  Filled 2015-03-27 (×2): qty 2

## 2015-03-27 MED ORDER — SODIUM CHLORIDE 0.9 % IV SOLN
INTRAVENOUS | Status: DC | PRN
Start: 2015-03-27 — End: 2015-03-27
  Administered 2015-03-27: 19:00:00 via INTRAVENOUS

## 2015-03-27 MED ORDER — ACETAMINOPHEN 325 MG PO TABS: 650.0000 mg | ORAL_TABLET | Freq: Four times a day (QID) | ORAL | Status: DC | PRN

## 2015-03-27 MED ORDER — CEFTRIAXONE SODIUM 2 G IJ SOLR
2.0000 g | Freq: Once | INTRAMUSCULAR | Status: AC
  Administered 2015-03-27: 2 g via INTRAVENOUS
  Filled 2015-03-27: qty 2

## 2015-03-27 MED ORDER — IOHEXOL 300 MG/ML  SOLN
100.0000 mL | Freq: Once | INTRAMUSCULAR | Status: AC | PRN
  Administered 2015-03-27: 100 mL via INTRAVENOUS

## 2015-03-27 MED ORDER — ACETAMINOPHEN 650 MG RE SUPP: 650.0000 mg | Freq: Four times a day (QID) | RECTAL | Status: DC | PRN

## 2015-03-27 MED ORDER — SUGAMMADEX SODIUM 200 MG/2ML IV SOLN
INTRAVENOUS | Status: DC | PRN
  Administered 2015-03-27: 200 mg via INTRAVENOUS

## 2015-03-27 MED ORDER — SIMETHICONE 80 MG PO CHEW: 40.0000 mg | CHEWABLE_TABLET | Freq: Four times a day (QID) | ORAL | Status: DC | PRN

## 2015-03-27 MED ORDER — OXYCODONE HCL 5 MG PO TABS
5.0000 mg | ORAL_TABLET | ORAL | Status: DC | PRN
  Administered 2015-03-27: 5 mg via ORAL
  Administered 2015-03-28 (×2): 10 mg via ORAL
  Filled 2015-03-27 (×2): qty 2
  Filled 2015-03-27: qty 1

## 2015-03-27 MED ORDER — PANTOPRAZOLE SODIUM 40 MG PO TBEC
40.0000 mg | DELAYED_RELEASE_TABLET | Freq: Every day | ORAL | Status: DC
  Administered 2015-03-28 – 2015-03-29 (×2): 40 mg via ORAL
  Filled 2015-03-27 (×2): qty 1

## 2015-03-27 MED ORDER — MIDAZOLAM HCL 5 MG/5ML IJ SOLN
INTRAMUSCULAR | Status: DC | PRN
  Administered 2015-03-27: 2 mg via INTRAVENOUS

## 2015-03-27 MED ORDER — BUPIVACAINE-EPINEPHRINE (PF) 0.25% -1:200000 IJ SOLN
INTRAMUSCULAR | Status: AC
  Filled 2015-03-27: qty 30

## 2015-03-27 MED ORDER — FENTANYL CITRATE (PF) 250 MCG/5ML IJ SOLN
INTRAMUSCULAR | Status: AC
  Filled 2015-03-27: qty 5

## 2015-03-27 MED ORDER — HYDROMORPHONE HCL 1 MG/ML IJ SOLN
INTRAMUSCULAR | Status: AC
  Administered 2015-03-28: 07:00:00
  Filled 2015-03-27: qty 1

## 2015-03-27 MED ORDER — SODIUM CHLORIDE 0.9 % IR SOLN
Status: DC | PRN
  Administered 2015-03-27: 1

## 2015-03-27 MED ORDER — HYDROMORPHONE HCL 1 MG/ML IJ SOLN
0.2500 mg | INTRAMUSCULAR | Status: DC | PRN
  Administered 2015-03-27 (×3): 0.5 mg via INTRAVENOUS

## 2015-03-27 MED ORDER — ONDANSETRON HCL 4 MG/2ML IJ SOLN
INTRAMUSCULAR | Status: DC | PRN
  Administered 2015-03-27: 4 mg via INTRAVENOUS

## 2015-03-27 MED ORDER — LIDOCAINE HCL (CARDIAC) 20 MG/ML IV SOLN
INTRAVENOUS | Status: DC | PRN
  Administered 2015-03-27: 60 mg via INTRAVENOUS

## 2015-03-27 MED ORDER — SUGAMMADEX SODIUM 200 MG/2ML IV SOLN
INTRAVENOUS | Status: AC
  Filled 2015-03-27: qty 2

## 2015-03-27 MED ORDER — ROCURONIUM BROMIDE 100 MG/10ML IV SOLN
INTRAVENOUS | Status: DC | PRN
  Administered 2015-03-27: 30 mg via INTRAVENOUS

## 2015-03-27 MED ORDER — ONDANSETRON 4 MG PO TBDP: 4.0000 mg | ORAL_TABLET | Freq: Four times a day (QID) | ORAL | Status: DC | PRN

## 2015-03-27 MED ORDER — SUCCINYLCHOLINE CHLORIDE 20 MG/ML IJ SOLN
INTRAMUSCULAR | Status: DC | PRN
  Administered 2015-03-27: 100 mg via INTRAVENOUS

## 2015-03-27 MED ORDER — MORPHINE SULFATE (PF) 2 MG/ML IV SOLN: 2.0000 mg | INTRAVENOUS | Status: DC | PRN

## 2015-03-27 SURGICAL SUPPLY — 41 items
APPLIER CLIP ROT 10 11.4 M/L (STAPLE)
CANISTER SUCTION 2500CC (MISCELLANEOUS) ×3 IMPLANT
CHLORAPREP W/TINT 26ML (MISCELLANEOUS) ×3 IMPLANT
CLIP APPLIE ROT 10 11.4 M/L (STAPLE) IMPLANT
CLOSURE WOUND 1/2 X4 (GAUZE/BANDAGES/DRESSINGS) ×1
COVER SURGICAL LIGHT HANDLE (MISCELLANEOUS) ×3 IMPLANT
CUTTER FLEX LINEAR 45M (STAPLE) ×3 IMPLANT
DERMABOND ADHESIVE PROPEN (GAUZE/BANDAGES/DRESSINGS) ×2
DERMABOND ADVANCED .7 DNX6 (GAUZE/BANDAGES/DRESSINGS) ×1 IMPLANT
DEVICE TROCAR PUNCTURE CLOSURE (ENDOMECHANICALS) ×3 IMPLANT
ELECT REM PT RETURN 9FT ADLT (ELECTROSURGICAL) ×3
ELECTRODE REM PT RTRN 9FT ADLT (ELECTROSURGICAL) ×1 IMPLANT
GLOVE BIO SURGEON STRL SZ7 (GLOVE) ×3 IMPLANT
GLOVE BIOGEL PI IND STRL 7.5 (GLOVE) ×1 IMPLANT
GLOVE BIOGEL PI INDICATOR 7.5 (GLOVE) ×2
GOWN STRL REUS W/ TWL LRG LVL3 (GOWN DISPOSABLE) ×3 IMPLANT
GOWN STRL REUS W/TWL LRG LVL3 (GOWN DISPOSABLE) ×6
KIT BASIN OR (CUSTOM PROCEDURE TRAY) ×3 IMPLANT
KIT ROOM TURNOVER OR (KITS) ×3 IMPLANT
LIQUID BAND (GAUZE/BANDAGES/DRESSINGS) ×3 IMPLANT
NS IRRIG 1000ML POUR BTL (IV SOLUTION) ×3 IMPLANT
PAD ARMBOARD 7.5X6 YLW CONV (MISCELLANEOUS) ×6 IMPLANT
POUCH RETRIEVAL ECOSAC 10 (ENDOMECHANICALS) ×1 IMPLANT
POUCH RETRIEVAL ECOSAC 10MM (ENDOMECHANICALS) ×2
RELOAD 45 VASCULAR/THIN (ENDOMECHANICALS) ×3 IMPLANT
RELOAD STAPLE TA45 3.5 REG BLU (ENDOMECHANICALS) ×3 IMPLANT
SCALPEL HARMONIC ACE (MISCELLANEOUS) ×3 IMPLANT
SCISSORS LAP 5X35 DISP (ENDOMECHANICALS) IMPLANT
SET IRRIG TUBING LAPAROSCOPIC (IRRIGATION / IRRIGATOR) ×3 IMPLANT
SLEEVE ENDOPATH XCEL 5M (ENDOMECHANICALS) ×3 IMPLANT
SPECIMEN JAR SMALL (MISCELLANEOUS) ×3 IMPLANT
STRIP CLOSURE SKIN 1/2X4 (GAUZE/BANDAGES/DRESSINGS) ×2 IMPLANT
SUT MNCRL AB 4-0 PS2 18 (SUTURE) ×3 IMPLANT
SUT VICRYL 0 UR6 27IN ABS (SUTURE) ×3 IMPLANT
TOWEL OR 17X24 6PK STRL BLUE (TOWEL DISPOSABLE) ×3 IMPLANT
TOWEL OR 17X26 10 PK STRL BLUE (TOWEL DISPOSABLE) ×3 IMPLANT
TRAY FOLEY CATH 16FR SILVER (SET/KITS/TRAYS/PACK) ×3 IMPLANT
TRAY LAPAROSCOPIC MC (CUSTOM PROCEDURE TRAY) ×3 IMPLANT
TROCAR XCEL BLUNT TIP 100MML (ENDOMECHANICALS) ×3 IMPLANT
TROCAR XCEL NON-BLD 5MMX100MML (ENDOMECHANICALS) ×3 IMPLANT
TUBING INSUFFLATION (TUBING) ×3 IMPLANT

## 2015-03-27 NOTE — Anesthesia Preprocedure Evaluation (Addendum)
Anesthesia Evaluation  Patient identified by MRN, date of birth, ID band Patient awake    Reviewed: Allergy & Precautions, H&P , NPO status , Patient's Chart, lab work & pertinent test results  Airway Mallampati: II  TM Distance: >3 FB Neck ROM: Full    Dental no notable dental hx. (+) Teeth Intact, Dental Advisory Given   Pulmonary neg pulmonary ROS, former smoker,    Pulmonary exam normal breath sounds clear to auscultation       Cardiovascular negative cardio ROS   Rhythm:Regular Rate:Normal     Neuro/Psych Depression negative neurological ROS     GI/Hepatic Neg liver ROS, GERD  Medicated and Controlled,  Endo/Other  negative endocrine ROS  Renal/GU negative Renal ROS  negative genitourinary   Musculoskeletal  (+) Arthritis ,   Abdominal   Peds  Hematology negative hematology ROS (+)   Anesthesia Other Findings   Reproductive/Obstetrics negative OB ROS                            Anesthesia Physical Anesthesia Plan  ASA: II and emergent  Anesthesia Plan: General   Post-op Pain Management:    Induction: Intravenous, Rapid sequence and Cricoid pressure planned  Airway Management Planned: Oral ETT  Additional Equipment:   Intra-op Plan:   Post-operative Plan: Extubation in OR  Informed Consent: I have reviewed the patients History and Physical, chart, labs and discussed the procedure including the risks, benefits and alternatives for the proposed anesthesia with the patient or authorized representative who has indicated his/her understanding and acceptance.   Dental advisory given  Plan Discussed with: CRNA  Anesthesia Plan Comments:         Anesthesia Quick Evaluation

## 2015-03-27 NOTE — Anesthesia Procedure Notes (Signed)
Procedure Name: Intubation Date/Time: 03/27/2015 7:31 PM Performed by: Arlice Colt B Pre-anesthesia Checklist: Patient identified, Emergency Drugs available, Suction available, Patient being monitored and Timeout performed Patient Re-evaluated:Patient Re-evaluated prior to inductionOxygen Delivery Method: Circle system utilized Preoxygenation: Pre-oxygenation with 100% oxygen Intubation Type: IV induction, Rapid sequence and Cricoid Pressure applied Laryngoscope Size: Mac and 3 Grade View: Grade I Tube type: Oral Tube size: 7.5 mm Number of attempts: 1 Airway Equipment and Method: Stylet Placement Confirmation: ETT inserted through vocal cords under direct vision,  positive ETCO2 and breath sounds checked- equal and bilateral Secured at: 21 cm Tube secured with: Tape Dental Injury: Teeth and Oropharynx as per pre-operative assessment

## 2015-03-27 NOTE — Discharge Instructions (Signed)
[facility] LAPAROSCOPIC [facility]: POST OP INSTRUCTIONS  Always review your discharge instruction sheet given to you by the facility where your surgery was performed. IF YOU HAVE DISABILITY OR FAMILY LEAVE FORMS, YOU MUST BRING THEM TO THE OFFICE FOR PROCESSING.   DO NOT GIVE THEM TO YOUR DOCTOR.  1. A prescription for pain medication may be given to you upon discharge.  Take your pain medication as prescribed, if needed.  If narcotic pain medicine is not needed, then you may take acetaminophen (Tylenol), naprosyn (Alleve), or ibuprofen (Advil) as needed. 2. Take your usually prescribed medications unless otherwise directed. 3. If you need a refill on your pain medication, please contact your pharmacy.  They will contact our office to request authorization. Prescriptions will not be filled after 5pm or on week-ends. 4. You should follow a light diet the first few days after arrival home, such as soup and crackers, etc.  Be sure to include lots of fluids daily. 5. Most patients will experience some swelling and bruising in the area of the incisions.  Ice packs will help.  Swelling and bruising can take several days to resolve.  6. It is common to experience some constipation if taking pain medication after surgery.  Increasing fluid intake and taking a stool softener (such as Colace) will usually help or prevent this problem from occurring.  A mild laxative (Milk of Magnesia or Miralax) should be taken according to package instructions if there are no bowel movements after 48 hours. 7. Unless discharge instructions indicate otherwise, you may remove your bandages 48 hours after surgery, and you may shower at that time.  You may have steri-strips (small skin tapes) in place directly over the incision.  These strips should be left on the skin for 7-10 days.  If your surgeon used skin glue on the incision, you may shower in 24 hours.  The glue will flake  off over the next 2-3 weeks.  Any sutures or staples will be removed at the office during your follow-up visit. 8. ACTIVITIES:  You may resume regular (light) daily activities beginning the next day--such as daily self-care, walking, climbing stairs--gradually increasing activities as tolerated.  You may have sexual intercourse when it is comfortable.  Refrain from any heavy lifting or straining until approved by your doctor. a. You may drive when you are no longer taking prescription pain medication, you can comfortably wear a seatbelt, and you can safely maneuver your car and apply brakes. b. RETURN TO WORK:  __________________________________________________________ 9. You should see your doctor in the office for a follow-up appointment approximately 2-3 weeks after your surgery.  Make sure that you call for this appointment within a day or two after you arrive home to insure a convenient appointment time. 10. OTHER INSTRUCTIONS: __________________________________________________________________________________________________________________________ __________________________________________________________________________________________________________________________ WHEN TO CALL YOUR DOCTOR: 1. Fever over 101.0 2. Inability to urinate 3. Continued bleeding from incision. 4. Increased pain, redness, or drainage from the incision. 5. Increasing abdominal pain  The clinic staff is available to answer your questions during regular business hours.  Please don't hesitate to call and ask to speak to one of the nurses for clinical concerns.  If you have a medical emergency, go to the nearest emergency room or call 911.  A surgeon from [facility] is always on call at the hospital. [address], [address], [place], [place]  [address] ? [address] [address], , [place]   [address] [phone] ? [phone] ? [phone]   [phone] Web site: [email or web address]  

## 2015-03-27 NOTE — Transfer of Care (Signed)
Immediate Anesthesia Transfer of Care Note  Patient: Christina Guerrero  Procedure(s) Performed: Procedure(s): APPENDECTOMY LAPAROSCOPIC (N/A)  Patient Location: PACU  Anesthesia Type:General  Level of Consciousness: awake, alert  and oriented  Airway & Oxygen Therapy: Patient Spontanous Breathing and Patient connected to face mask oxygen  Post-op Assessment: Report given to RN and Post -op Vital signs reviewed and stable  Post vital signs: Reviewed and stable  Last Vitals:  Filed Vitals:   03/27/15 1745 03/27/15 2034  BP: 144/76 152/56  Pulse: 68   Temp:  36.8 C  Resp: 19 19    Complications: No apparent anesthesia complications

## 2015-03-27 NOTE — Anesthesia Postprocedure Evaluation (Signed)
Anesthesia Post Note  Patient: Christina Guerrero  Procedure(s) Performed: Procedure(s) (LRB): APPENDECTOMY LAPAROSCOPIC (N/A)  Patient location during evaluation: PACU Anesthesia Type: General Level of consciousness: awake and alert Pain management: pain level controlled Vital Signs Assessment: post-procedure vital signs reviewed and stable Respiratory status: spontaneous breathing, nonlabored ventilation, respiratory function stable and patient connected to nasal cannula oxygen Cardiovascular status: blood pressure returned to baseline and stable Postop Assessment: no signs of nausea or vomiting Anesthetic complications: no    Last Vitals:  Filed Vitals:   03/27/15 2112 03/27/15 2130  BP: 132/54 104/53  Pulse: 76 81  Temp: 36.8 C 37.2 C  Resp: 12 12    Last Pain:  Filed Vitals:   03/27/15 2132  PainSc: Asleep                 Nayab Aten,W. EDMOND

## 2015-03-27 NOTE — H&P (Signed)
Christina Guerrero is an 75 y.o. female.   Chief Complaint: rlq pain HPI: 89 yof with 24 hours of rlq pain, not going away, a little nausea, no emesis, no fever, having flatus.  Prior csc a few years ago was normal.  Underwent ct with appendicitis  Past Medical History  Diagnosis Date  . Arthritis   . Chicken pox   . Depression   . GERD (gastroesophageal reflux disease)   . High cholesterol   . Urine incontinence   . UTI (urinary tract infection)   . Restless leg syndrome   . Sleep apnea     borderline    Past Surgical History  Procedure Laterality Date  . Tonsillectomy and adenoidectomy  1954    Family History  Problem Relation Age of Onset  . Hyperlipidemia Mother   . Hypertension Mother   . Mental illness Mother   . Stroke Mother   . Thyroid disease Mother   . Depression Mother   . Kidney disease Father   . Mental illness Father   . Alcoholism Father   . Hepatitis Father   . Sudden death Father   . Alcohol abuse Father   . Stroke Maternal Grandfather    Social History:  reports that she has quit smoking. She does not have any smokeless tobacco history on file. She reports that she drinks about 1.2 oz of alcohol per week. She reports that she does not use illicit drugs.  Allergies:  Allergies  Allergen Reactions  . Other     propolene glycol  . Pravastatin     Mood changes    meds reviewed  Results for orders placed or performed during the hospital encounter of 03/27/15 (from the past 48 hour(s))  CBC with Differential/Platelet     Status: Abnormal   Collection Time: 03/27/15  4:06 PM  Result Value Ref Range   WBC 10.5 4.0 - 10.5 K/uL   RBC 4.73 3.87 - 5.11 MIL/uL   Hemoglobin 15.7 (H) 12.0 - 15.0 g/dL   HCT 47.0 (H) 36.0 - 46.0 %   MCV 99.4 78.0 - 100.0 fL   MCH 33.2 26.0 - 34.0 pg   MCHC 33.4 30.0 - 36.0 g/dL   RDW 13.1 11.5 - 15.5 %   Platelets 188 150 - 400 K/uL   Neutrophils Relative % 80 %   Neutro Abs 8.4 (H) 1.7 - 7.7 K/uL   Lymphocytes  Relative 14 %   Lymphs Abs 1.4 0.7 - 4.0 K/uL   Monocytes Relative 6 %   Monocytes Absolute 0.6 0.1 - 1.0 K/uL   Eosinophils Relative 0 %   Eosinophils Absolute 0.0 0.0 - 0.7 K/uL   Basophils Relative 0 %   Basophils Absolute 0.0 0.0 - 0.1 K/uL  Comprehensive metabolic panel     Status: Abnormal   Collection Time: 03/27/15  4:06 PM  Result Value Ref Range   Sodium 138 135 - 145 mmol/L   Potassium 4.0 3.5 - 5.1 mmol/L   Chloride 101 101 - 111 mmol/L   CO2 25 22 - 32 mmol/L   Glucose, Bld 112 (H) 65 - 99 mg/dL   BUN 8 6 - 20 mg/dL   Creatinine, Ser 0.84 0.44 - 1.00 mg/dL   Calcium 9.7 8.9 - 10.3 mg/dL   Total Protein 6.7 6.5 - 8.1 g/dL   Albumin 3.8 3.5 - 5.0 g/dL   AST 19 15 - 41 U/L   ALT 20 14 - 54 U/L   Alkaline Phosphatase 119  38 - 126 U/L   Total Bilirubin 1.6 (H) 0.3 - 1.2 mg/dL   GFR calc non Af Amer >60 >60 mL/min   GFR calc Af Amer >60 >60 mL/min    Comment: (NOTE) The eGFR has been calculated using the CKD EPI equation. This calculation has not been validated in all clinical situations. eGFR's persistently <60 mL/min signify possible Chronic Kidney Disease.    Anion gap 12 5 - 15  Lipase, blood     Status: None   Collection Time: 03/27/15  4:06 PM  Result Value Ref Range   Lipase 20 11 - 51 U/L  Protime-INR     Status: None   Collection Time: 03/27/15  4:06 PM  Result Value Ref Range   Prothrombin Time 13.9 11.6 - 15.2 seconds   INR 1.05 0.00 - 1.49  APTT     Status: None   Collection Time: 03/27/15  4:06 PM  Result Value Ref Range   aPTT 35 24 - 37 seconds  I-Stat CG4 Lactic Acid, ED     Status: None   Collection Time: 03/27/15  4:08 PM  Result Value Ref Range   Lactic Acid, Venous 1.18 0.5 - 2.0 mmol/L   Ct Abdomen Pelvis W Contrast  03/27/2015  CLINICAL DATA:  75 year old presenting with acute onset of right lower quadrant abdominal pain yesterday which has progressively worsened. EXAM: CT ABDOMEN AND PELVIS WITH CONTRAST TECHNIQUE: Multidetector CT  imaging of the abdomen and pelvis was performed using the standard protocol following bolus administration of intravenous contrast. CONTRAST:  154m OMNIPAQUE IOHEXOL 300 MG/ML IV. Oral contrast was not administered. COMPARISON:  None. FINDINGS: Lower chest: Scarring involving the right middle lobe. Minimal dependent atelectasis in the left lower lobe. Visualized lung bases otherwise clear. Heart size upper normal with 3 vessel coronary atherosclerosis. Hepatobiliary: Liver normal in size and appearance. Gallbladder normal in appearance without calcified gallstones. No biliary ductal dilation. Pancreas: Normal in appearance without evidence of mass, ductal dilation, or inflammation. Spleen: Normal in size and appearance. Adrenals/Urinary Tract: Normal appearing adrenal glands. Cortical cyst arising from the mid left kidney. No significant abnormality involving either kidney. No urinary tract calculi or obstruction. Normal-appearing urinary bladder. Stomach/Bowel: Dilation of the appendix up to approximately 12 mm diameter with associated mucosal enhancement. Marked periappendiceal edema/inflammation. No abnormal fluid collection. No extraluminal gas. Moderate-sized hiatal hernia. Stomach otherwise unremarkable. Normal-appearing small bowel. Entire colon relatively decompressed. Scattered sigmoid colon diverticula without evidence of acute diverticulitis. Remainder of the colon unremarkable with expected stool burden. No ascites. 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 Visceral arteries atherosclerotic though patent. 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 No pathologic lymphadenopathy. Reproductive: Atrophic uterus consistent with age. No adnexal masses. Other: None. Musculoskeletal: Osseous demineralization. Degenerative disc disease and spondylosis at L2-3. Facet degenerative changes throughout the lower lumbar spine. No acute abnormalities. IMPRESSION: 1.  Acute appendicitis.  No evidence of perforation or abscess. 2. Moderate-sized hiatal hernia. 3. Sigmoid colon diverticulosis without evidence of acute diverticulitis. 4. 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 5. 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 I telephoned these results at the time of interpretation on 03/27/2015 at 5:26 pm to Dr. BNoemi Chapel who verbally acknowledged these results. Electronically Signed   By: TEvangeline DakinM.D.   On: 03/27/2015 17:27    Review of Systems  Constitutional: Negative for fever.  Gastrointestinal: Positive for nausea and abdominal pain. Negative for vomiting and diarrhea.    Blood pressure 144/76, pulse 68,  resp. rate 19, SpO2 94 %. Physical Exam  Constitutional: She is oriented to person, place, and time. She appears well-developed and well-nourished.  Cardiovascular: Normal rate, regular rhythm and normal heart sounds.   Respiratory: Effort normal and breath sounds normal. She has no wheezes. She has no rales.  GI: Soft. Bowel sounds are normal. She exhibits no distension. There is tenderness in the right lower quadrant. There is tenderness at McBurney's point. No hernia.  Lymphadenopathy:    She has no cervical adenopathy.  Neurological: She is alert and oriented to person, place, and time.     Assessment/Plan Appendicitis  Proceed with lap appy tonight. Procedure discussed  [facility] 03/27/2015, 6:17 PM

## 2015-03-27 NOTE — ED Notes (Signed)
Patient complains of a constant right sided abdominal pain that started 1130 AM yesterday and has gotten progressively worse.  Patient alert and oriented and in no apparent distress at this time.

## 2015-03-27 NOTE — Telephone Encounter (Signed)
[facility] - Client TELEPHONE ADVICE RECORD   [facility]     Patient Name: Christina Guerrero Initial Comment Caller states she has lower right abdominal cramping and pain since yesterday, concerned it it appendicitis  DOB: 26-Sep-1940      Nurse Assessment  Nurse: Tera Mater, RN, Elnita Maxwell Date/Time (Eastern Time): 03/27/2015 1:15:08 PM  Confirm and document reason for call. If symptomatic, describe symptoms. You must click the next button to save text entered. ---Caller states that she has been having constant lower right abd pain since yesterday. Pain is worse with walking. Denies vomiting or fever but states that she has no appetite.  Has the patient traveled out of the country within the last 30 days? ---Not Applicable  Does the patient have any new or worsening symptoms? ---Yes  Will a triage be completed? ---Yes  Related visit to physician within the last 2 weeks? ---No  Does the PT have any chronic conditions? (i.e. diabetes, asthma, etc.) ---Yes  List chronic conditions. ---depression  Is this a behavioral health or substance abuse call? ---No    Guidelines     Guideline Title Affirmed Question Affirmed Notes   Abdominal Pain - Female [1] SEVERE pain (e.g., excruciating) AND [2] present > 1 hour    Final Disposition User   Go to ED Now Tera Mater, RN, Cheryl     Referrals   [facility] - ED   Disagree/Comply: Comply

## 2015-03-27 NOTE — ED Provider Notes (Signed)
CSN: [id number]     Arrival date & time 03/27/15  1405 History   First MD Initiated Contact with Patient 03/27/15 1511     Chief Complaint  Patient presents with  . Abdominal Pain     (Consider location/radiation/quality/duration/timing/severity/associated sxs/prior Treatment) HPI Comments: The patient is a 76 year old female, she has no prior abdominal surgical history, she denies any heart history, she reports that for the last 24 hours she has had a gradually progressive abdominal pain that started in the epigastrium and migrated down towards the right lower quadrant. At this time the patient has ongoing pain, she has an associated large bowel movement within the last couple of days, she denies any urinary symptoms such as hematuria, frequency, dysuria and no fevers or chills. She has had mild nausea but no vomiting., She is not on any blood thinners.  Patient is a 75 y.o. female presenting with abdominal pain. The history is provided by the patient.  Abdominal Pain   Past Medical History  Diagnosis Date  . Arthritis   . Chicken pox   . Depression   . GERD (gastroesophageal reflux disease)   . High cholesterol   . Urine incontinence   . UTI (urinary tract infection)   . Restless leg syndrome   . Sleep apnea     borderline   Past Surgical History  Procedure Laterality Date  . Tonsillectomy and adenoidectomy  1954   Family History  Problem Relation Age of Onset  . Hyperlipidemia Mother   . Hypertension Mother   . Mental illness Mother   . Stroke Mother   . Thyroid disease Mother   . Depression Mother   . Kidney disease Father   . Mental illness Father   . Alcoholism Father   . Hepatitis Father   . Sudden death Father   . Alcohol abuse Father   . Stroke Maternal Grandfather    Social History  Substance Use Topics  . Smoking status: Former Games developer  . Smokeless tobacco: None  . Alcohol Use: 1.2 oz/week    0 Standard drinks or equivalent, 2 Glasses of wine per week      Comment: glass of wine once per day   OB History    No data available     Review of Systems  Gastrointestinal: Positive for abdominal pain.  All other systems reviewed and are negative.     Allergies  Other and Pravastatin  Home Medications   Prior to Admission medications   Medication Sig Start Date End Date Taking? Authorizing Provider  acetaminophen (TYLENOL) 325 MG tablet Take 650 mg by mouth every 6 (six) hours as needed for mild pain, moderate pain or headache.   Yes Historical Provider, MD  LORazepam (ATIVAN) 0.5 MG tablet Take 1 tablet (0.5 mg total) by mouth at bedtime. 02/28/15  Yes Cleotis Nipper, MD  omeprazole (PRILOSEC) 20 MG capsule Take 20 mg by mouth daily.   Yes Historical Provider, MD  rOPINIRole (REQUIP) 0.5 MG tablet Take 1 tablet (0.5 mg total) by mouth at bedtime. 02/15/15 02/15/16 Yes Cleotis Nipper, MD  rOPINIRole (REQUIP) 2 MG tablet take 1 tablet by mouth at bedtime Patient taking differently: take 1 tablet by mouth daily as needed for restless legs 02/15/15  Yes Cleotis Nipper, MD  venlafaxine XR (EFFEXOR-XR) 150 MG 24 hr capsule Take 1 capsule (150 mg total) by mouth daily with breakfast. 02/15/15  Yes Cleotis Nipper, MD  benzonatate (TESSALON PERLES) 100 MG capsule Take 1  capsule (100 mg total) by mouth 3 (three) times daily as needed for cough. Patient not taking: Reported on 03/27/2015 02/24/15   [name], DO  doxycycline (VIBRA-TABS) 100 MG tablet Take 1 tablet (100 mg total) by mouth 2 (two) times daily. Patient not taking: Reported on 03/27/2015 02/24/15   [name], DO  pravastatin (PRAVACHOL) 40 MG tablet Take 1 tablet (40 mg total) by mouth daily. Patient not taking: Reported on 03/27/2015 12/23/14   [name], DO  predniSONE (DELTASONE) 10 MG tablet  (4 tabs) daily for 2 days, then (3 tabs) daily for 2 days, then  (2 tabs) daily for 2 days, then  (1 tab) daily for 2 days Patient not taking: Reported on 03/27/2015 02/24/15    [name], DO   BP 144/76 mmHg  Pulse 68  Resp 19  SpO2 94% Physical Exam  Constitutional: She appears well-developed and well-nourished. No distress.  HENT:  Head: Normocephalic and atraumatic.  Mouth/Throat: Oropharynx is clear and moist. No oropharyngeal exudate.  Eyes: Conjunctivae and EOM are normal. Pupils are equal, round, and reactive to light. Right eye exhibits no discharge. Left eye exhibits no discharge. No scleral icterus.  Neck: Normal range of motion. Neck supple. No JVD present. No thyromegaly present.  Cardiovascular: Normal rate, regular rhythm, normal heart sounds and intact distal pulses.  Exam reveals no gallop and no friction rub.   No murmur heard. Pulmonary/Chest: Effort normal and breath sounds normal. No respiratory distress. She has no wheezes. She has no rales.  Abdominal: Soft. Bowel sounds are normal. She exhibits no distension and no mass. There is tenderness (  Mild guarding to the right lower quadrant where there is focal tenderness, normal bowel sounds, no peritonitis, no right upper quadrant tenderness, no left-sided tenderness).  Musculoskeletal: Normal range of motion. She exhibits no edema or tenderness.  Lymphadenopathy:    She has no cervical adenopathy.  Neurological: She is alert. Coordination normal.  Skin: Skin is warm and dry. No rash noted. No erythema.  Psychiatric: She has a normal mood and affect. Her behavior is normal.  Nursing note and vitals reviewed.   ED Course  Procedures (including critical care time) Labs Review Labs Reviewed  CBC WITH DIFFERENTIAL/PLATELET - Abnormal; Notable for the following:    Hemoglobin 15.7 (*)    HCT 47.0 (*)    Neutro Abs 8.4 (*)    All other components within normal limits  COMPREHENSIVE METABOLIC PANEL - Abnormal; Notable for the following:    Glucose, Bld 112 (*)    Total Bilirubin 1.6 (*)    All other components within normal limits  LIPASE, BLOOD  PROTIME-INR  APTT  URINALYSIS,  ROUTINE W REFLEX MICROSCOPIC (NOT AT [facility])  I-STAT CG4 LACTIC ACID, ED  I-STAT CG4 LACTIC ACID, ED    Imaging Review Ct Abdomen Pelvis W Contrast  03/27/2015  CLINICAL DATA:  75 year old presenting with acute onset of right lower quadrant abdominal pain yesterday which has progressively worsened. EXAM: CT ABDOMEN AND PELVIS WITH CONTRAST TECHNIQUE: Multidetector CT imaging of the abdomen and pelvis was performed using the standard protocol following bolus administration of intravenous contrast. CONTRAST:  OMNIPAQUE IOHEXOL 300 MG/ML IV. Oral contrast was not administered. COMPARISON:  None. FINDINGS: Lower chest: Scarring involving the right middle lobe. Minimal dependent atelectasis in the left lower lobe. Visualized lung bases otherwise clear. Heart size upper normal with 3 vessel coronary atherosclerosis. Hepatobiliary: Liver normal in size and appearance. Gallbladder normal in  appearance without calcified gallstones. No biliary ductal dilation. Pancreas: Normal in appearance without evidence of mass, ductal dilation, or inflammation. Spleen: Normal in size and appearance. Adrenals/Urinary Tract: Normal appearing adrenal glands. Cortical cyst arising from the mid left kidney. No significant abnormality involving either kidney. No urinary tract calculi or obstruction. Normal-appearing urinary bladder. Stomach/Bowel: Dilation of the appendix up to approximately 12 mm diameter with associated mucosal enhancement. Marked periappendiceal edema/inflammation. No abnormal fluid collection. No extraluminal gas. Moderate-sized hiatal hernia. Stomach otherwise unremarkable. Normal-appearing small bowel. Entire colon relatively decompressed. Scattered sigmoid colon diverticula without evidence of acute diverticulitis. Remainder of the colon unremarkable with expected stool burden. No ascites. 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 Visceral arteries atherosclerotic though  patent. 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 No pathologic lymphadenopathy. Reproductive: Atrophic uterus consistent with age. No adnexal masses. Other: None. Musculoskeletal: Osseous demineralization. Degenerative disc disease and spondylosis at L2-3. Facet degenerative changes throughout the lower lumbar spine. No acute abnormalities. IMPRESSION: 1. Acute appendicitis.  No evidence of perforation or abscess. 2. Moderate-sized hiatal hernia. 3. Sigmoid colon diverticulosis without evidence of acute diverticulitis. 4. 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 5. 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 I telephoned these results at the time of interpretation on 03/27/2015 at 5:26 pm to Dr. Eber Hong, who verbally acknowledged these results. Electronically Signed   By: Hulan Saas M.D.   On: 03/27/2015 17:27   Dg Chest Port 1 View  03/27/2015  CLINICAL DATA:  Pre operative respiratory exam.  Acute appendicitis. EXAM: PORTABLE CHEST 1 VIEW COMPARISON:  None. FINDINGS: Heart size and pulmonary vascularity are normal. Lungs are clear except for No significant osseous abnormality. Scattered calcified granulomas in each lung. IMPRESSION: No active disease. Electronically Signed   By: Francene Boyers M.D.   On: 03/27/2015 18:25   I have personally reviewed and evaluated these images and lab results as part of my medical decision-making.  ED ECG REPORT  I personally interpreted this EKG   Date: 03/27/2015   Rate: 81  Rhythm: normal sinus rhythm  QRS Axis: left  Intervals: normal  ST/T Wave abnormalities: normal  Conduction Disutrbances:none  Narrative Interpretation:   Old EKG Reviewed: none available   MDM   Final diagnoses:  Other acute appendicitis    Cardiac and pulmonary exams are unremarkable, abdominal exam is concerning for acute appendicitis but would also consider other etiologies such as kidney stone, right-sided  diverticulitis, colitis, tumor, less likely to be bowel obstruction on the CT scan ordered to further elucidate the source of the patient's symptoms. Labs pending, vital signs unremarkable.  CT shows appy, labs overall unremarkable  Pt stable for admit to Gen Surg  D/w Dr. Dwain Sarna who will see in ED  Meds given in ED:  Medications  Ampicillin-Sulbactam (UNASYN) 3 g in sodium chloride 0.9 % 100 mL IVPB (3 g Intravenous New Bag/Given 03/27/15 1803)  cefTRIAXone (ROCEPHIN) 2 g in dextrose 5 % 50 mL IVPB (not administered)  fentaNYL (SUBLIMAZE) injection 50 mcg (50 mcg Intravenous Given 03/27/15 1605)  iohexol (OMNIPAQUE) 300 MG/ML solution 100 mL (100 mLs Intravenous Contrast Given 03/27/15 1704)        Eber Hong, MD 03/27/15 1845

## 2015-03-27 NOTE — Op Note (Signed)
Preoperative diagnosis: acute appendicitis Postoperative diagnosis: same as above Procedure: laparoscopic appendectomy Surgeon: Dr Harden Mo Anesthesia: general EBL: minimal Drains none Specimen appendix to pathology Complications: none Sponge count correct at completion Disposition to recovery stable  Indications: This is a74 yof with rlq pain and ct with appendicitis. We discussed laparoscopic appendectomy.   Procedure: After informed consent was obtained the patient was taken to the operating room. She was already given antibiotics. Sequential compression devices were on her legs.She was placed under general anesthesia without complication. Her abdomen was prepped and draped in the standard sterile surgical fashion. A surgical timeout was then performed. A foley catheter was placed.   I infiltrated marcaine below the umbilicus. I made an incision and then entered the fascia sharply. I then entered the peritoneum bluntly. I placed a 0 vicryl pursestring suture and inserted a hasson trocar.I then inserted 2 further 5 mm trocars in the suprapubic region and the left mid abdomen. She did appear to have acute appendicitis. I saw the TI into the right colon. I then dissected it free from the cecum. I divided the mesoappendix with the harmonic scalpel. I then divided the appendix with the gia stapler. I then placed this in a bag and removed it from the abdomen.  I then obtained hemostasis and irrigated. I then removed the umbilical trocar and closed with 0 vicryl and the endoclose device after tying down the pursestring.  I then desufflated the abdomen and removed all my remaining trocars. I then closed these with 4-0 Monocryl and Dermabond. She tolerated this well was extubated and transferred to the recovery room in stable condition

## 2015-03-27 NOTE — ED Notes (Signed)
Patient transported to CT 

## 2015-03-27 NOTE — Telephone Encounter (Signed)
Per Dr Selena Batten she noticed per the chart the pt checked in at the ER.

## 2015-03-28 ENCOUNTER — Encounter (HOSPITAL_COMMUNITY): Payer: Self-pay | Admitting: General Practice

## 2015-03-28 DIAGNOSIS — K3580 Unspecified acute appendicitis: Secondary | ICD-10-CM | POA: Diagnosis not present

## 2015-03-28 LAB — GLUCOSE, CAPILLARY: GLUCOSE-CAPILLARY: 193 mg/dL — AB (ref 65–99)

## 2015-03-28 MED ORDER — IBUPROFEN 400 MG PO TABS
400.0000 mg | ORAL_TABLET | Freq: Four times a day (QID) | ORAL | Status: DC | PRN
  Administered 2015-03-28: 400 mg via ORAL
  Filled 2015-03-28: qty 1

## 2015-03-28 NOTE — Progress Notes (Signed)
1 Day Post-Op  Subjective: Says she tried to pass out when she first got up this Am, Not on hypertensive's.  Sites look fine, she has not eaten or had anything to drink so far.  She lives alone with husband who has Parkinson's disease.  Objective: Vital signs in last 24 hours: Temp:  [98.3 F (36.8 C)-99.5 F (37.5 C)] 99.5 F (37.5 C) (01/31 0602) Pulse Rate:  [68-113] 94 (01/31 0602) Resp:  [12-22] 16 (01/31 0602) BP: (104-154)/(53-81) 108/56 mmHg (01/31 0602) SpO2:  [93 %-100 %] 98 % (01/31 0602) Weight:  [76.34 kg (168 lb 4.8 oz)] 76.34 kg (168 lb 4.8 oz) (01/30 2130) Last BM Date: 03/26/15 240 Po 600 urine Full liquid diet Afebrile, VSS TM 99.5 Intake/Output from previous day: 01/30 0701 - 01/31 0700 In: 1690 [P.O.:240; I.V.:1450] Out: 605 [Urine:600; Blood:5] Intake/Output this shift:    General appearance: alert, cooperative and no distress Resp: clear to auscultation bilaterally GI: soft sore to minimal touch, sites look fine, few Bs.  Lab Results:   Recent Labs  03/27/15 1606  WBC 10.5  HGB 15.7*  HCT 47.0*  PLT 188    BMET  Recent Labs  03/27/15 1606  NA 138  K 4.0  CL 101  CO2 25  GLUCOSE 112*  BUN 8  CREATININE 0.84  CALCIUM 9.7   PT/INR  Recent Labs  03/27/15 1606  LABPROT 13.9  INR 1.05     Recent Labs Lab 03/27/15 1606  AST 19  ALT 20  ALKPHOS 119  BILITOT 1.6*  PROT 6.7  ALBUMIN 3.8     Lipase     Component Value Date/Time   LIPASE 20 03/27/2015 1606     Studies/Results: Ct Abdomen Pelvis W Contrast  03/27/2015  CLINICAL DATA:  75 year old presenting with acute onset of right lower quadrant abdominal pain yesterday which has progressively worsened. EXAM: CT ABDOMEN AND PELVIS WITH CONTRAST TECHNIQUE: Multidetector CT imaging of the abdomen and pelvis was performed using the standard protocol following bolus administration of intravenous contrast. CONTRAST:  OMNIPAQUE IOHEXOL 300 MG/ML IV. Oral contrast was not  administered. COMPARISON:  None. FINDINGS: Lower chest: Scarring involving the right middle lobe. Minimal dependent atelectasis in the left lower lobe. Visualized lung bases otherwise clear. Heart size upper normal with 3 vessel coronary atherosclerosis. Hepatobiliary: Liver normal in size and appearance. Gallbladder normal in appearance without calcified gallstones. No biliary ductal dilation. Pancreas: Normal in appearance without evidence of mass, ductal dilation, or inflammation. Spleen: Normal in size and appearance. Adrenals/Urinary Tract: Normal appearing adrenal glands. Cortical cyst arising from the mid left kidney. No significant abnormality involving either kidney. No urinary tract calculi or obstruction. Normal-appearing urinary bladder. Stomach/Bowel: Dilation of the appendix up to approximately 12 mm diameter with associated mucosal enhancement. Marked periappendiceal edema/inflammation. No abnormal fluid collection. No extraluminal gas. Moderate-sized hiatal hernia. Stomach otherwise unremarkable. Normal-appearing small bowel. Entire colon relatively decompressed. Scattered sigmoid colon diverticula without evidence of acute diverticulitis. Remainder of the colon unremarkable with expected stool burden. No ascites. 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 Visceral arteries atherosclerotic though patent. 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 No pathologic lymphadenopathy. Reproductive: Atrophic uterus consistent with age. No adnexal masses. Other: None. Musculoskeletal: Osseous demineralization. Degenerative disc disease and spondylosis at L2-3. Facet degenerative changes throughout the lower lumbar spine. No acute abnormalities. IMPRESSION: 1. Acute appendicitis.  No evidence of perforation or abscess. 2. Moderate-sized hiatal hernia. 3. Sigmoid colon diverticulosis without evidence of acute diverticulitis.  4. Severe aortoiliofemoral  atherosclerosis without aneurysm. 5. 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 I telephoned these results at the time of interpretation on 03/27/2015 at 5:26 pm to Dr. Eber Hong, who verbally acknowledged these results. Electronically Signed   By: Hulan Saas M.D.   On: 03/27/2015 17:27   Dg Chest Port 1 View  03/27/2015  CLINICAL DATA:  Pre operative respiratory exam.  Acute appendicitis. EXAM: PORTABLE CHEST 1 VIEW COMPARISON:  None. FINDINGS: Heart size and pulmonary vascularity are normal. Lungs are clear except for No significant osseous abnormality. Scattered calcified granulomas in each lung. IMPRESSION: No active disease. Electronically Signed   By: Francene Boyers M.D.   On: 03/27/2015 18:25    Medications: . enoxaparin (LOVENOX) injection  40 mg Subcutaneous Q24H  . pantoprazole  40 mg Oral Daily  . rOPINIRole  0.5 mg Oral QHS  . venlafaxine XR  150 mg Oral Q breakfast   . sodium chloride 50 mL/hr at 03/27/15 2130   Prior to Admission medications   Medication Sig Start Date End Date Taking? Authorizing Provider  acetaminophen (TYLENOL) 325 MG tablet Take 650 mg by mouth every 6 (six) hours as needed for mild pain, moderate pain or headache.   Yes Historical Provider, MD  LORazepam (ATIVAN) 0.5 MG tablet Take 1 tablet (0.5 mg total) by mouth at bedtime. 02/28/15  Yes Cleotis Nipper, MD  omeprazole (PRILOSEC) 20 MG capsule Take 20 mg by mouth daily.   Yes Historical Provider, MD  rOPINIRole (REQUIP) 0.5 MG tablet Take 1 tablet (0.5 mg total) by mouth at bedtime. 02/15/15 02/15/16 Yes Cleotis Nipper, MD  rOPINIRole (REQUIP) 2 MG tablet take 1 tablet by mouth at bedtime Patient taking differently: take 1 tablet by mouth daily as needed for restless legs 02/15/15  Yes Cleotis Nipper, MD  venlafaxine XR (EFFEXOR-XR) 150 MG 24 hr capsule Take 1 capsule (150 mg total) by mouth daily with breakfast. 02/15/15  Yes Cleotis Nipper, MD  benzonatate (TESSALON PERLES) 100 MG  capsule Take 1 capsule (100 mg total) by mouth 3 (three) times daily as needed for cough. Patient not taking: Reported on 03/27/2015 02/24/15   [name], DO  doxycycline (VIBRA-TABS) 100 MG tablet Take 1 tablet (100 mg total) by mouth 2 (two) times daily. Patient not taking: Reported on 03/27/2015 02/24/15   [name], DO  pravastatin (PRAVACHOL) 40 MG tablet Take 1 tablet (40 mg total) by mouth daily. Patient not taking: Reported on 03/27/2015 12/23/14   [name], DO  predniSONE (DELTASONE) 10 MG tablet  (4 tabs) daily for 2 days, then (3 tabs) daily for 2 days, then  (2 tabs) daily for 2 days, then  (1 tab) daily for 2 days Patient not taking: Reported on 03/27/2015 02/24/15   [name], DO     Assessment/Plan Acute appendicitis S/p laparoscopic appendectomy, 03/27/15, Dr. Dwain Sarna Depression  Arthritis Restless leg/sleep apnea, borderline GERD High cholesterol Antibiotics:  preop DVT:  Lovenox/SCD    Plan:  Check orthostatics, I took off her O2, mobilize, advance diet.  Hopefully home later today.      Jaran Sainz 03/28/2015

## 2015-03-28 NOTE — Progress Notes (Signed)
Pt to be followed up in our office by Dr. Dwain Sarna.  Info already in AVS.
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
## 2015-03-28 NOTE — Care Management Obs Status (Signed)
MEDICARE OBSERVATION STATUS NOTIFICATION   Patient Details  Name: Christina Guerrero MRN: [id number] Date of Birth: 08-May-1940   Medicare Observation Status Notification Given:  Yes    Kingsley Plan, RN 03/28/2015, 2:34 PM

## 2015-03-29 DIAGNOSIS — K3580 Unspecified acute appendicitis: Secondary | ICD-10-CM | POA: Diagnosis not present

## 2015-03-29 MED ORDER — OXYCODONE HCL 5 MG PO TABS: 5.0000 mg | ORAL_TABLET | ORAL | Status: DC | PRN

## 2015-03-29 NOTE — Progress Notes (Signed)
IV removed. Belongings packed. Transportation arranged by patient.  AVS given to patient, understanding demonstrated.

## 2015-03-29 NOTE — Discharge Summary (Signed)
Physician Discharge Summary  Patient ID: Christina Guerrero MRN: [id number] DOB/AGE: [birth year] 75 y.o.  Admit date: 03/27/2015 Discharge date: 03/29/2015  Admitting Diagnosis: Acute appendicitis  Discharge Diagnosis Patient Active Problem List   Diagnosis Date Noted  . S/P laparoscopic appendectomy 03/27/2015  . Depressive disorder 02/04/2014  . GERD (gastroesophageal reflux disease) 05/03/2013  . Restless leg 05/03/2013    Consultants none  Imaging: Ct Abdomen Pelvis W Contrast  03/27/2015  CLINICAL DATA:  75 year old presenting with acute onset of right lower quadrant abdominal pain yesterday which has progressively worsened. EXAM: CT ABDOMEN AND PELVIS WITH CONTRAST TECHNIQUE: Multidetector CT imaging of the abdomen and pelvis was performed using the standard protocol following bolus administration of intravenous contrast. CONTRAST:  OMNIPAQUE IOHEXOL 300 MG/ML IV. Oral contrast was not administered. COMPARISON:  None. FINDINGS: Lower chest: Scarring involving the right middle lobe. Minimal dependent atelectasis in the left lower lobe. Visualized lung bases otherwise clear. Heart size upper normal with 3 vessel coronary atherosclerosis. Hepatobiliary: Liver normal in size and appearance. Gallbladder normal in appearance without calcified gallstones. No biliary ductal dilation. Pancreas: Normal in appearance without evidence of mass, ductal dilation, or inflammation. Spleen: Normal in size and appearance. Adrenals/Urinary Tract: Normal appearing adrenal glands. Cortical cyst arising from the mid left kidney. No significant abnormality involving either kidney. No urinary tract calculi or obstruction. Normal-appearing urinary bladder. Stomach/Bowel: Dilation of the appendix up to approximately 12 mm diameter with associated mucosal enhancement. Marked periappendiceal edema/inflammation. No abnormal fluid collection. No extraluminal gas. Moderate-sized hiatal hernia. Stomach otherwise  unremarkable. Normal-appearing small bowel. Entire colon relatively decompressed. Scattered sigmoid colon diverticula without evidence of acute diverticulitis. Remainder of the colon unremarkable with expected stool burden. No ascites. 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 Visceral arteries atherosclerotic though patent. 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 No pathologic lymphadenopathy. Reproductive: Atrophic uterus consistent with age. No adnexal masses. Other: None. Musculoskeletal: Osseous demineralization. Degenerative disc disease and spondylosis at L2-3. Facet degenerative changes throughout the lower lumbar spine. No acute abnormalities. IMPRESSION: 1. Acute appendicitis.  No evidence of perforation or abscess. 2. Moderate-sized hiatal hernia. 3. Sigmoid colon diverticulosis without evidence of acute diverticulitis. 4. 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 5. 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 I telephoned these results at the time of interpretation on 03/27/2015 at 5:26 pm to Dr. Eber Hong, who verbally acknowledged these results. Electronically Signed   By: Hulan Saas M.D.   On: 03/27/2015 17:27   Dg Chest Port 1 View  03/27/2015  CLINICAL DATA:  Pre operative respiratory exam.  Acute appendicitis. EXAM: PORTABLE CHEST 1 VIEW COMPARISON:  None. FINDINGS: Heart size and pulmonary vascularity are normal. Lungs are clear except for No significant osseous abnormality. Scattered calcified granulomas in each lung. IMPRESSION: No active disease. Electronically Signed   By: Francene Boyers M.D.   On: 03/27/2015 18:25    Procedures Laparoscopic appendectomy---Dr. Marta Lamas Course:  Harmon Dun presented to [facility] with RLQ abdominal pain.  Workup showed appendicitis.  Patient was admitted and underwent procedure listed above.  Tolerated procedure well and was transferred to the  floor.  Diet was advanced as tolerated.  On POD#1 she was having some dizziness, work up was negative and subsequently resolved.  On POD#2, the patient was voiding well, tolerating diet, ambulating well, pain well controlled, vital signs stable, incisions c/d/i and felt stable for discharge home.  Medication risks, benefits and therapeutic alternatives were reviewed with the patient.  She  verbalizes understanding.  Patient will follow up in our office in 2 weeks and knows to call with questions or concerns.  Physical Exam: General:  Alert, NAD, pleasant, comfortable Abd:  Soft, ND, mild tenderness, incisions C/D/I    Medication List    TAKE these medications        acetaminophen 325 MG tablet  Commonly known as:  TYLENOL  Take 650 mg by mouth every 6 (six) hours as needed for mild pain, moderate pain or headache.     benzonatate 100 MG capsule  Commonly known as:  TESSALON PERLES  Take 1 capsule (100 mg total) by mouth 3 (three) times daily as needed for cough.     doxycycline 100 MG tablet  Commonly known as:  VIBRA-TABS  Take 1 tablet (100 mg total) by mouth 2 (two) times daily.     LORazepam 0.5 MG tablet  Commonly known as:  ATIVAN  Take 1 tablet (0.5 mg total) by mouth at bedtime.     omeprazole 20 MG capsule  Commonly known as:  PRILOSEC  Take 20 mg by mouth daily.     oxyCODONE 5 MG immediate release tablet  Commonly known as:  Oxy IR/ROXICODONE  Take 1-2 tablets (5-10 mg total) by mouth every 4 (four) hours as needed for moderate pain.     pravastatin 40 MG tablet  Commonly known as:  PRAVACHOL  Take 1 tablet (40 mg total) by mouth daily.     predniSONE 10 MG tablet  Commonly known as:  DELTASONE   (4 tabs) daily for 2 days, then (3 tabs) daily for 2 days, then  (2 tabs) daily for 2 days, then  (1 tab) daily for 2 days     rOPINIRole 2 MG tablet  Commonly known as:  REQUIP  take 1 tablet by mouth at bedtime     rOPINIRole 0.5 MG tablet  Commonly  known as:  REQUIP  Take 1 tablet (0.5 mg total) by mouth at bedtime.     venlafaxine XR 150 MG 24 hr capsule  Commonly known as:  EFFEXOR-XR  Take 1 capsule (150 mg total) by mouth daily with breakfast.             Follow-up Information    Follow up with [facility], MD In 2 weeks.   Specialty:  General Surgery   Contact information:   [address] [place] [address] [phone]       Signed: Ashok Norris, [facility] [phone]  03/29/2015, 7:41 AM

## 2015-04-04 ENCOUNTER — Encounter: Payer: Self-pay | Admitting: Family Medicine

## 2015-04-04 ENCOUNTER — Ambulatory Visit (INDEPENDENT_AMBULATORY_CARE_PROVIDER_SITE_OTHER): Payer: Medicare Other | Admitting: Family Medicine

## 2015-04-04 VITALS — BP 128/70 | HR 74 | Temp 98.7°F | Ht 66.0 in

## 2015-04-04 DIAGNOSIS — Z9049 Acquired absence of other specified parts of digestive tract: Secondary | ICD-10-CM | POA: Diagnosis not present

## 2015-04-04 DIAGNOSIS — G2581 Restless legs syndrome: Secondary | ICD-10-CM | POA: Diagnosis not present

## 2015-04-04 DIAGNOSIS — D6489 Other specified anemias: Secondary | ICD-10-CM | POA: Diagnosis not present

## 2015-04-04 LAB — CBC WITH DIFFERENTIAL/PLATELET
BASOS ABS: 0.1 10*3/uL (ref 0.0–0.1)
Basophils Relative: 0.7 % (ref 0.0–3.0)
EOS ABS: 0.3 10*3/uL (ref 0.0–0.7)
Eosinophils Relative: 3.8 % (ref 0.0–5.0)
HEMATOCRIT: 35.1 % — AB (ref 36.0–46.0)
HEMOGLOBIN: 11.4 g/dL — AB (ref 12.0–15.0)
LYMPHS PCT: 20.9 % (ref 12.0–46.0)
Lymphs Abs: 1.8 10*3/uL (ref 0.7–4.0)
MCHC: 32.6 g/dL (ref 30.0–36.0)
MCV: 101.3 fl — ABNORMAL HIGH (ref 78.0–100.0)
MONO ABS: 0.5 10*3/uL (ref 0.1–1.0)
Monocytes Relative: 5.9 % (ref 3.0–12.0)
Neutro Abs: 5.9 10*3/uL (ref 1.4–7.7)
Neutrophils Relative %: 68.7 % (ref 43.0–77.0)
Platelets: 438 10*3/uL — ABNORMAL HIGH (ref 150.0–400.0)
RBC: 3.47 Mil/uL — AB (ref 3.87–5.11)
RDW: 14.4 % (ref 11.5–15.5)
WBC: 8.5 10*3/uL (ref 4.0–10.5)

## 2015-04-04 NOTE — Patient Instructions (Addendum)
BEFORE YOU LEAVE: -labs  Iron tablet daily for 2 weeks  Can take 1-2 of your restless leg tablets if needed (0.5-1mg )  Follow up with your surgeon as planned  Follow up as needed if worsening or if symptoms fail to improve

## 2015-04-04 NOTE — Progress Notes (Signed)
HPI:  Walk in - husband had appointment today. Has had worsening of RLS since recent hospitalization for appendicitis - s/p appendectomy. Reports recovering well from surgery o/w and has no fevers, chills, abd or bowel concerns and plans to follow up with her surgeon. She wonders if she can take a higher dose of the ropinirole. Take 0.5 mg nightly but feels  works better from time to time and tolerates well.   ROS: See pertinent positives and negatives per HPI.  Past Medical History  Diagnosis Date  . Arthritis   . Chicken pox   . Depression   . GERD (gastroesophageal reflux disease)   . High cholesterol   . Urine incontinence   . UTI (urinary tract infection)   . Restless leg syndrome   . Sleep apnea     borderline    Past Surgical History  Procedure Laterality Date  . Tonsillectomy and adenoidectomy  1954  . Appendectomy  02/2015    LAPROSCOPIC   . Colonoscopy  2012  . Laparoscopic appendectomy N/A 03/27/2015    Procedure: APPENDECTOMY LAPAROSCOPIC;  Surgeon: Emelia Loron, MD;  Location: [facility] OR;  Service: General;  Laterality: N/A;    Family History  Problem Relation Age of Onset  . Hyperlipidemia Mother   . Hypertension Mother   . Mental illness Mother   . Stroke Mother   . Thyroid disease Mother   . Depression Mother   . Kidney disease Father   . Mental illness Father   . Alcoholism Father   . Hepatitis Father   . Sudden death Father   . Alcohol abuse Father   . Stroke Maternal Grandfather     Social History   Social History  . Marital Status: Married    Spouse Name: N/A  . Number of Children: N/A  . Years of Education: N/A   Social History Main Topics  . Smoking status: Former Games developer  . Smokeless tobacco: Never Used     Comment: QUIT SMOKING  IN 1989  . Alcohol Use: 1.2 oz/week    2 Glasses of wine, 0 Standard drinks or equivalent per week     Comment: glass of wine once per day  . Drug Use: No  . Sexual Activity: Not Currently   Other  Topics Concern  . 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  acetaminophen (TYLENOL) 325 MG tablet, Take 650 mg by mouth every 6 (six) hours as needed for mild pain, moderate pain or headache., Disp: , Rfl:  .  LORazepam (ATIVAN) 0.5 MG tablet, Take 1 tablet (0.5 mg total) by mouth at bedtime., Disp: 30 tablet, Rfl: 1 .  omeprazole (PRILOSEC) 20 MG capsule, Take 20 mg by mouth daily., Disp: , Rfl:  .  pravastatin (PRAVACHOL) 40 MG tablet, Take 1 tablet (40 mg total) by mouth daily., Disp: 90 tablet, Rfl: 3 .  rOPINIRole (REQUIP) 0.5 MG tablet, Take 1 tablet (0.5 mg total) by mouth at bedtime., Disp: 90 tablet, Rfl: 0 .  venlafaxine XR (EFFEXOR-XR) 150 MG 24 hr capsule, Take 1 capsule (150 mg total) by mouth daily with breakfast., Disp: 90 capsule, Rfl: 0  EXAM:  Filed Vitals:  04/04/15 1429  BP: 128/70  Pulse: 74  Temp: 98.7 F (37.1 C)    There is no weight on file to calculate BMI.  GENERAL: vitals reviewed and listed above, alert, oriented, appears well hydrated and in no acute distress, pale complexion.  HEENT: atraumatic, conjunttiva clear, no obvious abnormalities on inspection of external nose and ears  NECK: no obvious masses on inspection  LUNGS: clear to auscultation bilaterally, no wheezes, rales or rhonchi, good air movement  CV: HRRR, no peripheral edema  MS: moves all extremities without noticeable abnormality  PSYCH: pleasant and cooperative, no obvious depression or anxiety  ASSESSMENT AND PLAN:  Discussed the following assessment and plan:  Restless leg  S/P laparoscopic appendectomy  Anemia due to other cause - Plan: CBC with Differential  -briefly reviewed discharge documents and labs -no labs after surgery and appears a little  pale today - will check cbc  -drop in ferritin could worsen her RLS -advise OTC low dose iron supplement vs increased dietary intake of iron if can tolerate without constipation and ok to take  ropinirole if needed (recent psych notes state she take ). She is aware of interactions with the ativan and has taken  in the past and tolerated well.  -Patient advised to return or notify a doctor immediately if symptoms worsen or persist or new concerns arise.  Patient Instructions  BEFORE YOU LEAVE: -labs  Iron tablet daily for 2 weeks  Can take 1-2 of your restless leg tablets if needed (0.5-1mg )  Follow up with your surgeon as planned  Follow up as needed if worsening or if symptoms fail to improve     Christina Guerrero R.

## 2015-04-04 NOTE — Progress Notes (Signed)
Pre visit review using our clinic review tool, if applicable. No additional management support is needed unless otherwise documented below in the visit note. 

## 2015-04-19 ENCOUNTER — Encounter (HOSPITAL_COMMUNITY): Payer: Self-pay | Admitting: Clinical

## 2015-04-19 ENCOUNTER — Ambulatory Visit (INDEPENDENT_AMBULATORY_CARE_PROVIDER_SITE_OTHER): Payer: Medicare Other | Admitting: Clinical

## 2015-04-19 DIAGNOSIS — F331 Major depressive disorder, recurrent, moderate: Secondary | ICD-10-CM

## 2015-04-19 NOTE — Progress Notes (Signed)
   THERAPIST PROGRESS NOTE  Session Time: 11:00 - 12:00  Participation Level: Active  Behavioral Response: CasualAlertDepressed  Type of Therapy: Individual Therapy  Treatment Goals addressed: reduce psychiatric symptoms, elevate mood , improve unhelpful thought patterns, elevate mood (improved self-esteem, stress management (stress reduction)   Interventions: CBT and Motivational Interviewing, grounding and mindfulness techniques, psychoeducation  Summary: Christina Guerrero is a  75 y.o. female who presents with Major depressive disorder, recurrent episode, moderate.  Suicidal/Homicidal: No -without intent/plan  Therapist Response: Barnetta Chapel met with clinician for an individual session. [name] discussed her psychiatric symptoms, her current life events and her goals for therapy. [name] shared that she has been depressed. She shared that she is finding it difficult to get out of bed in the morning and completing daily tasks.  [name] discussed some of the things that increase her stress such as her husband's deteriorating health. Clinician introduced grounding and mindfulness techniques. Clinician explained the process, purpose, and practice of the techniques. Client and clinician practiced some techniques together. Barnetta Chapel asked clarifying questions which clinician answered. Clinician introduced some basic cbt concepts. Clinician gave [name] a cbt based packet on depression which [name] agreed to complete and bring back with her next session. Clinician introduced the idea of [name] keeping a gratitude journal. Clinician explained the process and purpose. [name] agreed to  Keep a gratitude journal until next session    Plan: Return again in 1-2 weeks.  Diagnosis: Axis I: Major depressive disorder, recurrent episode, moderate.    [name], LCSW 04/19/2015

## 2015-04-24 ENCOUNTER — Encounter: Payer: Self-pay | Admitting: Family Medicine

## 2015-04-24 ENCOUNTER — Ambulatory Visit (INDEPENDENT_AMBULATORY_CARE_PROVIDER_SITE_OTHER): Payer: Medicare Other | Admitting: Family Medicine

## 2015-04-24 VITALS — BP 130/68 | HR 68 | Temp 98.6°F | Ht 66.0 in | Wt 161.2 lb

## 2015-04-24 DIAGNOSIS — Z9049 Acquired absence of other specified parts of digestive tract: Secondary | ICD-10-CM | POA: Diagnosis not present

## 2015-04-24 DIAGNOSIS — F32A Depression, unspecified: Secondary | ICD-10-CM

## 2015-04-24 DIAGNOSIS — E785 Hyperlipidemia, unspecified: Secondary | ICD-10-CM | POA: Diagnosis not present

## 2015-04-24 DIAGNOSIS — K219 Gastro-esophageal reflux disease without esophagitis: Secondary | ICD-10-CM

## 2015-04-24 DIAGNOSIS — F329 Major depressive disorder, single episode, unspecified: Secondary | ICD-10-CM | POA: Diagnosis not present

## 2015-04-24 DIAGNOSIS — G2581 Restless legs syndrome: Secondary | ICD-10-CM

## 2015-04-24 LAB — LIPID PANEL
Cholesterol: 288 mg/dL — ABNORMAL HIGH (ref 0–200)
HDL: 54.2 mg/dL (ref >39.00)
NONHDL: 233.37
TRIGLYCERIDES: 230 mg/dL — AB (ref 0.0–149.0)
Total CHOL/HDL Ratio: 5
VLDL: 46 mg/dL — ABNORMAL HIGH (ref 0.0–40.0)

## 2015-04-24 LAB — LDL CHOLESTEROL, DIRECT: LDL DIRECT: 183 mg/dL

## 2015-04-24 NOTE — Patient Instructions (Signed)
Before you leave: -Labs -Medicare annual wellness exam in October, and further follow-up pending lab results and as needed  -We have ordered labs or studies at this visit. It can take up to 1-2 weeks for results and processing. We will contact you with instructions IF your results are abnormal. Normal results will be released to your [facility]. If you have not heard from [place] or can not find your results in [facility] in 2 weeks please contact our office.  We recommend the following healthy lifestyle measures: - eat a healthy whole foods diet consisting of regular small meals composed of vegetables, fruits, beans, nuts, seeds, healthy meats such as white chicken and fish and whole grains.  - avoid sweets, white starchy foods, fried foods, fast food, processed foods, sodas, red meet and other fattening foods.  - get a least 150-300 minutes of aerobic exercise per week.

## 2015-04-24 NOTE — Progress Notes (Signed)
HPI:  Christina Guerrero is a very pleasant 75 year old here for a follow-up visit. She has a history of hyperlipidemia, and recently we advised that she start statin therapy. She reports that she recalled on her way to pick up the prescription that she may have had a reaction to statin remotely-thinks it causes episode of crying. She is not getting regular exercise currently, diet is okay. Her restless leg symptoms have resolved after several weeks of iron therapy. She did stop the iron as it was causing some constipation. She is recovering okay regarding her recent appendectomy, though is still recovering in terms of her energy. She sees a psychiatrist for depression and has had some depressed mood recently and is working with her therapist. No chest pain, shortness of breath, abdominal pain, suicidal ideation or other concerns.  ROS: See pertinent positives and negatives per HPI.  Past Medical History  Diagnosis Date  . Arthritis   . Chicken pox   . Depression   . GERD (gastroesophageal reflux disease)   . High cholesterol   . Urine incontinence   . UTI (urinary tract infection)   . Restless leg syndrome   . Sleep apnea     borderline    Past Surgical History  Procedure Laterality Date  . Tonsillectomy and adenoidectomy  1954  . Appendectomy  02/2015    LAPROSCOPIC   . Colonoscopy  2012  . Laparoscopic appendectomy N/A 03/27/2015    Procedure: APPENDECTOMY LAPAROSCOPIC;  Surgeon: Emelia Loron, MD;  Location: [facility] OR;  Service: General;  Laterality: N/A;    Family History  Problem Relation Age of Onset  . Hyperlipidemia Mother   . Hypertension Mother   . Mental illness Mother   . Stroke Mother   . Thyroid disease Mother   . Depression Mother   . Kidney disease Father   . Mental illness Father   . Alcoholism Father   . Hepatitis Father   . Sudden death Father   . Alcohol abuse Father   . Stroke Maternal Grandfather     Social History   Social History  . Marital Status:  Married    Spouse Name: N/A  . Number of Children: N/A  . Years of Education: N/A   Social History Main Topics  . Smoking status: Former Games developer  . Smokeless tobacco: Never Used     Comment: QUIT SMOKING  IN 1989  . Alcohol Use: 1.2 oz/week    2 Glasses of wine, 0 Standard drinks or equivalent per week     Comment: glass of wine once per day  . Drug Use: No  . Sexual Activity: Not Currently   Other Topics Concern  . 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  LORazepam (ATIVAN) 0.5 MG tablet, Take 1 tablet (0.5 mg total) by mouth at bedtime., Disp: 30 tablet, Rfl: 1 .  omeprazole (PRILOSEC) 20 MG capsule, Take 20 mg by mouth daily., Disp: , Rfl:  .  pravastatin (PRAVACHOL) 40 MG tablet, Take 1 tablet (40 mg total) by mouth daily., Disp: 90 tablet, Rfl: 3 .  rOPINIRole (REQUIP) 0.5 MG tablet, Take 1 tablet (0.5 mg total) by mouth at bedtime., Disp: 90 tablet,  Rfl: 0 .  venlafaxine XR (EFFEXOR-XR) 150 MG 24 hr capsule, Take 1 capsule (150 mg total) by mouth daily with breakfast., Disp: 90 capsule, Rfl: 0  EXAM:  Filed Vitals:   04/24/15 1033  BP: 130/68  Pulse: 68  Temp: 98.6 F (37 C)    Body mass index is 26.03 kg/(m^2).  GENERAL: vitals reviewed and listed above, alert, oriented, appears well hydrated and in no acute distress  HEENT: atraumatic, conjunttiva clear, no obvious abnormalities on inspection of external nose and ears  NECK: no obvious masses on inspection  LUNGS: clear to auscultation bilaterally, no wheezes, rales or rhonchi, good air movement  CV: HRRR, no peripheral edema  MS: moves all extremities without noticeable abnormality  PSYCH: pleasant and cooperative, no obvious depression or anxiety  ASSESSMENT AND PLAN:  Discussed the  following assessment and plan:  Hyperlipemia - Plan: Lipid panel -Discussed treatment options and risk. Opted to recheck today and then try a low dose or alternate day statin. Her sister tolerated alternate day dosing statin and she is interested in this if does not tolerate daily dosing. Lifestyle recs.   Restless leg -Improving   S/P laparoscopic appendectomy -Recovering well   Depressive disorder -Managed by her psychiatrist   Gastroesophageal reflux disease, esophagitis presence not specified -Stable  -Patient advised to return or notify a doctor immediately if symptoms worsen or persist or new concerns arise.  Patient Instructions  Before you leave: -Labs -Medicare annual wellness exam in October, and further follow-up pending lab results and as needed  -We have ordered labs or studies at this visit. It can take up to 1-2 weeks for results and processing. We will contact you with instructions IF your results are abnormal. Normal results will be released to your [facility]. If you have not heard from [place] or can not find your results in [facility] in 2 weeks please contact our office.  We recommend the following healthy lifestyle measures: - eat a healthy whole foods diet consisting of regular small meals composed of vegetables, fruits, beans, nuts, seeds, healthy meats such as white chicken and fish and whole grains.  - avoid sweets, white starchy foods, fried foods, fast food, processed foods, sodas, red meet and other fattening foods.  - get a least 150-300 minutes of aerobic exercise per week.            Kriste Basque R.

## 2015-04-24 NOTE — Progress Notes (Signed)
Pre visit review using our clinic review tool, if applicable. No additional management support is needed unless otherwise documented below in the visit note. 

## 2015-05-03 ENCOUNTER — Ambulatory Visit (INDEPENDENT_AMBULATORY_CARE_PROVIDER_SITE_OTHER): Payer: Medicare Other | Admitting: Clinical

## 2015-05-03 ENCOUNTER — Encounter (HOSPITAL_COMMUNITY): Payer: Self-pay | Admitting: Clinical

## 2015-05-03 DIAGNOSIS — F331 Major depressive disorder, recurrent, moderate: Secondary | ICD-10-CM | POA: Diagnosis not present

## 2015-05-03 NOTE — Progress Notes (Deleted)
Psychiatric Initial Adult Assessment   Patient Identification: Christina Guerrero MRN:  [id number] Date of Evaluation:  05/03/2015 Referral Source: *** Chief Complaint:   Chief Complaint    Depression; Anxiety     Visit Diagnosis:    ICD-9-CM ICD-10-CM   1. Major depressive disorder, recurrent episode, moderate (HCC) 296.32 F33.1    Diagnosis:   Patient Active Problem List   Diagnosis Date Noted  . S/P laparoscopic appendectomy [Z90.49] 03/27/2015  . Depressive disorder [F32.9] 02/04/2014  . GERD (gastroesophageal reflux disease) [K21.9] 05/03/2013  . Restless leg [G25.81] 05/03/2013   History of Present Illness:  *** Elements:  {BHH HPI Elements:20779} Associated Signs/Symptoms: Depression Symptoms:  {DEPRESSION SYMPTOMS:20000} (Hypo) Manic Symptoms:  {BHH MANIC SYMPTOMS:22872} Anxiety Symptoms:  {BHH ANXIETY SYMPTOMS:22873} Psychotic Symptoms:  {BHH PSYCHOTIC SYMPTOMS:22874} PTSD Symptoms: {BHH PTSD WUJWJXBJ:47829}  Past Medical History:  Past Medical History  Diagnosis Date  . Arthritis   . Chicken pox   . Depression   . GERD (gastroesophageal reflux disease)   . High cholesterol   . Urine incontinence   . UTI (urinary tract infection)   . Restless leg syndrome   . Sleep apnea     borderline    Past Surgical History  Procedure Laterality Date  . Tonsillectomy and adenoidectomy  1954  . Appendectomy  02/2015    LAPROSCOPIC   . Colonoscopy  2012  . Laparoscopic appendectomy N/A 03/27/2015    Procedure: APPENDECTOMY LAPAROSCOPIC;  Surgeon: Emelia Loron, MD;  Location: [facility] OR;  Service: General;  Laterality: N/A;   Family History:  Family History  Problem Relation Age of Onset  . Hyperlipidemia Mother   . Hypertension Mother   . Mental illness Mother   . Stroke Mother   . Thyroid disease Mother   . Depression Mother   . Kidney disease Father   . Mental illness Father   . Alcoholism Father   . Hepatitis Father   . Sudden death Father   . Alcohol  abuse Father   . Stroke Maternal Grandfather    Social History:   Social History   Social History  . Marital Status: Married    Spouse Name: N/A  . Number of Children: N/A  . Years of Education: N/A   Social History Main Topics  . Smoking status: Former Games developer  . Smokeless tobacco: Never Used     Comment: QUIT SMOKING  IN 1989  . Alcohol Use: 1.2 oz/week    2 Glasses of wine, 0 Standard drinks or equivalent per week     Comment: glass of wine once per day  . Drug Use: No  . Sexual Activity: Not Currently   Other Topics Concern  . 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  General Appearance: {Appearance:22683}  Eye Contact:  {BHH EYE CONTACT:22684}  Speech:  {Speech:22685}  Volume:  {Volume (PAA):22686}  Mood:  {BHH MOOD:22306}  Affect:  {Affect (PAA):22687}  Thought Process:  {Thought  Process (PAA):22688}  Orientation:  {BHH ORIENTATION (PAA):22689}  Thought Content:  {Thought Content:22690}  Suicidal Thoughts:  {ST/HT (PAA):22692}  Homicidal Thoughts:  {ST/HT (PAA):22692}  Memory:  {BHH MEMORY:22881}  Judgement:  {Judgement (PAA):22694}  Insight:  {Insight (PAA):22695}  Psychomotor Activity:  {Psychomotor (PAA):22696}  Concentration:  {BHH GOOD/FAIR/POOR:22877}  Recall:  {BHH GOOD/FAIR/POOR:22877}  Fund of Knowledge:{BHH GOOD/FAIR/POOR:22877}  Language: {BHH GOOD/FAIR/POOR:22877}  Akathisia:  {BHH YES OR NO:22294}  Handed:  {Handed:22697}  AIMS (if indicated):  ***  Assets:   {Assets (PAA):22698}  ADL's:  {BHH ZOX'W:96045}ADL'S:22290}  Cognition: {chl bhh cognition:304700322}  Sleep:  ***   Is the patient at risk to self?  {yes no:314532} Has the patient been a risk to self in the past 6 months?  {yes no:314532} Has the patient been a risk to self within the distant past?  {yes no:314532} Is the patient a risk to others?  {yes no:314532} Has the patient been a risk to others in the past 6 months?  {yes no:314532} Has the patient been a risk to others within the distant past?  {yes no:314532}  Allergies:   Allergies  Allergen Reactions  . Other     propolene glycol  . Pravastatin     Mood changes   Current Medications: Current Outpatient Prescriptions  Medication Sig Dispense Refill  . LORazepam (ATIVAN) 0.5 MG tablet Take 1 tablet (0.5 mg total) by mouth at bedtime. 30 tablet 1  . omeprazole (PRILOSEC) 20 MG capsule Take 20 mg by mouth daily.    . pravastatin (PRAVACHOL) 40 MG tablet Take 1 tablet (40 mg total) by mouth daily. 90 tablet 3  . rOPINIRole (REQUIP) 0.5 MG tablet Take 1 tablet (0.5 mg total) by mouth at bedtime. 90 tablet 0  . venlafaxine XR (EFFEXOR-XR) 150 MG 24 hr capsule Take 1 capsule (150 mg total) by mouth daily with breakfast. 90 capsule 0   No current facility-administered medications for this visit.    Previous Psychotropic Medications: {YES/NO:21197}  Substance Abuse History in the last 12 months:  {yes no:314532}  Consequences of Substance Abuse: {BHH CONSEQUENCES OF SUBSTANCE ABUSE:22880}  Medical Decision Making:  {bh medical decision making:21022756}  Treatment Plan Summary: {CHL AMB BH MD TX Plan:[phone]}    Keira Bohlin A 3/8/201711:14 AM

## 2015-05-04 ENCOUNTER — Encounter (HOSPITAL_COMMUNITY): Payer: Self-pay | Admitting: Psychiatry

## 2015-05-04 ENCOUNTER — Ambulatory Visit (INDEPENDENT_AMBULATORY_CARE_PROVIDER_SITE_OTHER): Payer: Medicare Other | Admitting: Psychiatry

## 2015-05-04 VITALS — BP 130/78 | HR 88 | Ht 66.0 in | Wt 158.2 lb

## 2015-05-04 DIAGNOSIS — F32A Depression, unspecified: Secondary | ICD-10-CM

## 2015-05-04 DIAGNOSIS — F329 Major depressive disorder, single episode, unspecified: Secondary | ICD-10-CM | POA: Diagnosis not present

## 2015-05-04 MED ORDER — LORAZEPAM 0.5 MG PO TABS: 0.5000 mg | ORAL_TABLET | Freq: Every day | ORAL | Status: DC

## 2015-05-04 MED ORDER — VENLAFAXINE HCL ER 150 MG PO CP24: 150.0000 mg | ORAL_CAPSULE | Freq: Every day | ORAL | Status: DC

## 2015-05-04 MED ORDER — ROPINIROLE HCL 2 MG PO TABS: 2.0000 mg | ORAL_TABLET | Freq: Every day | ORAL | Status: DC
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
## 2015-05-07 NOTE — Progress Notes (Signed)
   THERAPIST PROGRESS NOTE  Session Time: 11:00 - 11:55  Participation Level: Active  Behavioral Response: Neat and Well GroomedAlertDepressed  Type of Therapy: Individual Therapy  Treatment Goals addressed: reduce psychiatric symptoms, elevate mood , improve unhelpful thought patterns, elevate mood, stress management (stress reduction)   Interventions: CBT and Motivational Interviewing, psychoeducation  Summary: [name] is a 75 y.o. female who presents with Major depressive disorder, recurrent episode, moderate.  Suicidal/Homicidal: No -without intent/plan  Therapist Response: Barnetta Chapel met with clinician for an individual session. Jonella discussed her psychiatric symptoms, her current life events and her homework. Maryem shared that she has had a difficult time finding things to write in her gratitude journal. Jamina shared about her life situation. Client and clinician discussed one one the things that brings up a lot of negative thoughts. Client and clinician discussed the event as a neutral event. Client and clinician discussed alternative ways to view the situation. Client and clinician discussed her negative automatic thoughts. Client and clinician discussed the emotions that accompanied those thoughts - anger. Client and clinician discussed the emotions and thoughts that the anger was protecting - fear and sadness. Client and clinician discussed the thoughts that accompanied those emotions. Priya shared about her husband's deteriorating health. Client and clinician discussed mindfulness. Barnetta Chapel and clinician discussed how her emotions might change if she was present with the current circumstances rather than with her fears about the future. Bethannie shared that she would have more to write in her gratitude journal. Client and clinician discussed how our thoughts inform our emotions. Chevette shared that she had completed her homework packet. Client and  clinician discussed and reviewed the packet. Ladona agreed to complete another packet and continue with her homework packet and to practice her grounding and mindfulness techniques    Plan: Return again in 1-2 weeks.  Diagnosis:Axis I: Major depressive disorder, recurrent episode, moderate.    Emeric Novinger A, LCSW 05/07/2015

## 2015-05-22 ENCOUNTER — Ambulatory Visit (HOSPITAL_COMMUNITY): Payer: Self-pay | Admitting: Clinical

## 2015-08-07 ENCOUNTER — Encounter (HOSPITAL_COMMUNITY): Payer: Self-pay | Admitting: Psychiatry

## 2015-08-07 ENCOUNTER — Ambulatory Visit (INDEPENDENT_AMBULATORY_CARE_PROVIDER_SITE_OTHER): Payer: Medicare Other | Admitting: Psychiatry

## 2015-08-07 VITALS — BP 130/78 | HR 75 | Ht 67.0 in | Wt 154.2 lb

## 2015-08-07 DIAGNOSIS — F32A Depression, unspecified: Secondary | ICD-10-CM

## 2015-08-07 DIAGNOSIS — F329 Major depressive disorder, single episode, unspecified: Secondary | ICD-10-CM

## 2015-08-07 MED ORDER — ROPINIROLE HCL 2 MG PO TABS: 2.0000 mg | ORAL_TABLET | Freq: Every day | ORAL | Status: DC

## 2015-08-07 MED ORDER — VENLAFAXINE HCL ER 150 MG PO CP24: 150.0000 mg | ORAL_CAPSULE | Freq: Every day | ORAL | Status: DC

## 2015-08-07 MED ORDER — LORAZEPAM 0.5 MG PO TABS: 0.5000 mg | ORAL_TABLET | Freq: Every day | ORAL | Status: DC
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
## 2015-10-31 ENCOUNTER — Ambulatory Visit (INDEPENDENT_AMBULATORY_CARE_PROVIDER_SITE_OTHER): Payer: Medicare Other | Admitting: *Deleted

## 2015-10-31 DIAGNOSIS — Z23 Encounter for immunization: Secondary | ICD-10-CM

## 2015-11-01 ENCOUNTER — Other Ambulatory Visit (HOSPITAL_COMMUNITY): Payer: Self-pay

## 2015-11-01 DIAGNOSIS — F32A Depression, unspecified: Secondary | ICD-10-CM

## 2015-11-01 DIAGNOSIS — F329 Major depressive disorder, single episode, unspecified: Secondary | ICD-10-CM

## 2015-11-01 MED ORDER — LORAZEPAM 0.5 MG PO TABS: 0.5000 mg | ORAL_TABLET | Freq: Every day | ORAL | 0 refills | Status: DC

## 2015-11-01 NOTE — Progress Notes (Signed)
Patient called for a refill on her Ativan, she has an appointment this month, but will be out today, refill is appropriate and per protocol a one month supply was called into the pharmacy. Patient is aware.

## 2015-11-02 ENCOUNTER — Other Ambulatory Visit (HOSPITAL_COMMUNITY): Payer: Self-pay

## 2015-11-02 DIAGNOSIS — F329 Major depressive disorder, single episode, unspecified: Secondary | ICD-10-CM

## 2015-11-02 DIAGNOSIS — F32A Depression, unspecified: Secondary | ICD-10-CM

## 2015-11-02 MED ORDER — ROPINIROLE HCL 2 MG PO TABS: 2.0000 mg | ORAL_TABLET | Freq: Every day | ORAL | 0 refills | Status: DC

## 2015-11-06 ENCOUNTER — Ambulatory Visit (HOSPITAL_COMMUNITY): Payer: Self-pay | Admitting: Psychiatry

## 2015-11-13 ENCOUNTER — Ambulatory Visit (HOSPITAL_COMMUNITY): Payer: Self-pay | Admitting: Psychiatry

## 2015-11-20 ENCOUNTER — Ambulatory Visit (INDEPENDENT_AMBULATORY_CARE_PROVIDER_SITE_OTHER): Payer: Medicare Other | Admitting: Psychiatry

## 2015-11-20 DIAGNOSIS — F329 Major depressive disorder, single episode, unspecified: Secondary | ICD-10-CM

## 2015-11-20 DIAGNOSIS — F32A Depression, unspecified: Secondary | ICD-10-CM

## 2015-11-20 MED ORDER — LORAZEPAM 0.5 MG PO TABS: 0.5000 mg | ORAL_TABLET | Freq: Every day | ORAL | 2 refills | Status: DC

## 2015-11-20 MED ORDER — ROPINIROLE HCL 2 MG PO TABS: 2.0000 mg | ORAL_TABLET | Freq: Every day | ORAL | 3 refills | Status: DC

## 2015-11-20 MED ORDER — VENLAFAXINE HCL ER 150 MG PO CP24: 150.0000 mg | ORAL_CAPSULE | Freq: Every day | ORAL | 0 refills | Status: DC

## 2015-11-20 NOTE — Patient Instructions (Addendum)
1. Continue Effexor 150 mg daily 2. Continue lorazepam 0.5 mg at night 3. Continue ropinirole 2 mg at night  4. Return to clinic in three months

## 2015-11-20 NOTE — Progress Notes (Signed)
[facility]  Progress Note   Christina Guerrero [id number] 75 y.o.  11/20/2015 2:05 PM  Chief Complaint:  Medication management and follow-up.           History of Present Illness:  Christina Guerrero came for her follow up appointment.  She states that she has been doing well with her current medication. She talks about her husband who was diagnosed Parkinson's disease 5 years ago. She feels that she is not doing good enough for him and feels that her family members and her friends talked with her that she is not doing her job. She does not have crying spells anymore, and reports she tries not to deviate from who she is. She states that she does not like the fact that she has not been able to enjoy socially after coming to [place]. She states that although there is a motivation, it has been difficult for her physicality and emotionally. She finds boxing class to be very helpful for him as well as for her. She reports some interaction with other people/family in the class. She has been to a beach recently with her family to celebrate her 57 th anniversary for their marriage and was very relaxed. She finds current medication to be helpful and would like to continue it.   She denies insomnia. She had panic attack twice since the last appointment. She reports occasional passive SI but adamantly denies any intent/plans. She denies alcohol use. She denies drug use.   Suicidal Ideation: No Plan Formed: No Patient has means to carry out plan: No  Homicidal Ideation: No Plan Formed: No Patient has means to carry out plan: No  Past Psychiatric History/Hospitalization(s) Per Dr. Robert Guerrero' note  And verified the information with patient. "Patient denies any history of suicidal attempt, inpatient psychiatric treatment, paranoia, hallucination, aggressive behavior, mania or any psychosis.  She remembers started depression in 1989 but do not remember the details very well.  Her previous psychiatrist is  Dr. Wendall Guerrero. In the past she had tried Klonopin which caused thinning of hair and Wellbutrin but did not help her."  Anxiety: Yes Bipolar Disorder: No Depression: Yes Mania: No Psychosis: No Schizophrenia: No Personality Disorder: No Hospitalization for psychiatric illness: No History of Electroconvulsive Shock Therapy: No Prior Suicide Attempts: No  Medical History; Patient has history of arthritis, restless leg syndrome.  Her primary care physician is Dr. Kriste Guerrero  Past Medical History:  Diagnosis Date  . Arthritis   . Chicken pox   . Depression   . GERD (gastroesophageal reflux disease)   . High cholesterol   . Restless leg syndrome   . Sleep apnea    borderline  . Urine incontinence   . UTI (urinary tract infection)      Psychosocial History; Per Dr. Sheela Stack note and verified the information with patient "She was born in [place].  She raised in [place].  She was living in [place] until last year she moved to [place].  Her husband diagnosed with Parkinson.  Patient's son is a physician who lives in [place].  Patient and her husband decided to live close by to her son.  She has 3 children.  Her daughter lives in [place] and her older son lives in [place]"  Review of Systems  Constitutional: Negative.   HENT: Negative.   Psychiatric/Behavioral: Positive for depression. Negative for hallucinations, substance abuse and suicidal ideas. The patient is nervous/anxious. The patient does not have insomnia.   On the time of  the feelings.  Needed   Psychiatric: Agitation: No Hallucination: No Depressed Mood: No Insomnia: No Hypersomnia: No Altered Concentration: No Feels Worthless: No Grandiose Ideas: No Belief In Special Powers: No New/Increased Substance Abuse: No Compulsions: No  Neurologic: Headache: No Seizure: No Paresthesias: No    Outpatient Encounter Prescriptions as of 11/20/2015  Medication Sig  . LORazepam (ATIVAN)  0.5 MG tablet Take 1 tablet (0.5 mg total) by mouth at bedtime.  Marland Kitchen. omeprazole (PRILOSEC) 20 MG capsule Take 20 mg by mouth daily.  . pravastatin (PRAVACHOL) 40 MG tablet Take 1 tablet (40 mg total) by mouth daily.  Marland Kitchen. rOPINIRole (REQUIP) 2 MG tablet Take 1 tablet (2 mg total) by mouth at bedtime.  Marland Kitchen. venlafaxine XR (EFFEXOR-XR) 150 MG 24 hr capsule Take 1 capsule (150 mg total) by mouth daily with breakfast.   No facility-administered encounter medications on file as of 11/20/2015.     No results found for this or any previous visit (from the past 2160 hour(s)).    Physical Exam: Constitutional:  There were no vitals taken for this visit.  Musculoskeletal: Strength & Muscle Tone: within normal limits Gait & Station: normal Patient leans: N/A  Mental Status Examination;  Patient is well groomed, well dressed female who is pleasant and cooperative.  She maintained good eye contact.  She described her mood is "fine" and her affect is appropriate, but occasional mildly tense when talking about her emotion.  Her speech is spontaneous, clear and coherent.  There were no paranoia, delusional or any obsessive thoughts.  She denies AH/VH. There were no delusions.  Her attention and concentration is fair.  She denies any active or passive suicidal thoughts or homicidal thoughts.  She is alert and oriented x3.  Her psychomotor activity is normal.  Her fund of knowledge is adequate.  Her insight judgment and impulse control is okay.   Established Problem, Stable/Improving (1), Review of Psycho-Social Stressors (1), Review of Last Therapy Session (1) and Review of Medication Regimen & Side Effects (2)  Assessment: Harmon DunCatherine Castles is a 75 year old female with depression, restless leg syndrome, sleep apnea who presents for follow up. She is one of Dr. Sheela Guerrero's patients.   # MDD Although the patient reports struggles taking care of her husband with Parkinson's disease, she denies any significant  neurovegetative symptoms on today's evaluation. Will continue current medication. Discussed cognitive distortion of mental filtering, and she is receptive to cognitive restructuring. Discussed options of supportive psychotherapy/CBT, which she declines at this time. Will monitor her mood symptoms as well as caregiver burnout.   # Restless leg syndrome Patient reports good benefit from ropinirole. Will continue current medication.   Plan:  1. Continue Effexor 150 mg daily 2. Continue lorazepam 0.5 mg at night 3. Continue ropinirole 2 mg at night  4. Return to clinic in three months  The patient demonstrates the following  risk factors for suicide: Chronic risk factors for suicide include psychiatric disorder/depression, demographic factors (age 68>60yo).  Acute risk factors for suicide include social withdrawal/isolation, being a caregiver for her husband of Parkinson's disease. Protective factors for this patient include positive social support, responsibility to others (children, family), coping skills, hope for the future. Although patient reports passive SI, she adamantly denies any plans/intent/previous attempt.  Considering these factors, the overall suicide risk at this point appears to be low.  Neysa Hottereina Chassie Pennix, MD 11/20/2015

## 2015-12-21 ENCOUNTER — Other Ambulatory Visit: Payer: Self-pay | Admitting: Family Medicine

## 2015-12-24 NOTE — Progress Notes (Signed)
Medicare Annual Preventive Care Visit  (initial annual wellness or annual wellness exam)  Concerns and/or follow up today:  Christina Guerrero is a pleasant 75 yo with a PMH significant for MDD (sees pscyaitrist), RLS and hyperlipidemia. Here for her annual wellness exam. She take prilosec 20mg  daily for acid reflux and feels symptoms are uncontrolled without. Requests rx for this. She suffers from chronic constipation. Can go several days without a BM. Denies change in bowel or bleeding. Sees scanned documentation for further details of HPI.  ROS: negative for report of fevers, unintentional weight loss, vision changes, vision loss, hearing loss or change, chest pain, sob, hemoptysis, melena, hematochezia, hematuria, genital discharge or lesions, falls, bleeding or bruising, loc, thoughts of suicide or self harm, memory loss  1.) Patient-completed health risk assessment  - completed and reviewed, see scanned documentation  2.) Review of Medical History: -PMH, PSH, Family History and current specialty and care providers reviewed and updated and listed below  - see scanned in document in chart and below  Past Medical History:  Diagnosis Date  . Arthritis   . Chicken pox   . Depression   . GERD (gastroesophageal reflux disease)   . High cholesterol   . Restless leg syndrome   . Sleep apnea    borderline  . Urine incontinence   . UTI (urinary tract infection)     Past Surgical History:  Procedure Laterality Date  . APPENDECTOMY  02/2015   LAPROSCOPIC   . COLONOSCOPY  2012  . LAPAROSCOPIC APPENDECTOMY N/A 03/27/2015   Procedure: APPENDECTOMY LAPAROSCOPIC;  Surgeon: Emelia LoronMatthew Wakefield, MD;  [facility];  Service: General;  Laterality: N/A;  . TONSILLECTOMY AND ADENOIDECTOMY  1954    Social History   Social History  . Marital status: Married    Spouse name: N/A  . Number of children: N/A  . Years of education: N/A   Occupational History  . Not on file.   Social History  Main Topics  . Smoking status: Former Games developermoker  . Smokeless tobacco: Never Used     Comment: QUIT SMOKING  IN 1989  . Alcohol use 1.2 oz/week    2 Glasses of wine per week     Comment: glass of wine once per day  . Drug use: No  . Sexual activity: Not Currently   Other Topics Concern  . Not on file   Social History Narrative   Work or School: retired Engineer, drillingadministrative assistant      Home Situation: living with spouse      Spiritual Beliefs: none      Lifestyle: no regular exercise; diet is so so             Family History  Problem Relation Age of Onset  . Hyperlipidemia Mother   . Hypertension Mother   . Mental illness Mother   . Stroke Mother   . Thyroid disease Mother   . Depression Mother   . Kidney disease Father   . Mental illness Father   . Alcoholism Father   . Hepatitis Father   . Sudden death Father   . Alcohol abuse Father   . Stroke Maternal Grandfather     Current Outpatient Prescriptions on File Prior to Visit  Medication Sig Dispense Refill  . LORazepam (ATIVAN) 0.5 MG tablet Take 1 tablet (0.5 mg total) by mouth at bedtime. 30 tablet 2  . pravastatin (PRAVACHOL) 40 MG tablet take 1 tablet by mouth once daily 90 tablet 1  . rOPINIRole (  REQUIP) 2 MG tablet Take 1 tablet (2 mg total) by mouth at bedtime. 90 tablet 3  . venlafaxine XR (EFFEXOR-XR) 150 MG 24 hr capsule Take 1 capsule (150 mg total) by mouth daily with breakfast. 90 capsule 0   No current facility-administered medications on file prior to visit.      3.) Review of functional ability and level of safety:  Any difficulty hearing?  See scanned documentation  History of falling?  See scanned documentation  Any trouble with IADLs - using a phone, using transportation, grocery shopping, preparing meals, doing housework, doing laundry, taking medications and managing money?  See scanned documentation  Advance Directives?  Discussed briefly and offered more resources and detailed  discussion with our trained staff.   See summary of recommendations in Patient Instructions below.  4.) Physical Exam Vitals:   12/25/15 1140  BP: 132/80  Pulse: 75  Temp: 98.5 F (36.9 C)   Estimated body mass index is 24.55 kg/m as calculated from the following:   Height as of this encounter: 5' 6.75" (1.695 m).   Weight as of this encounter: 155 lb 9.6 oz (70.6 kg).  EKG (optional): deferred  General: alert, appear well hydrated and in no acute distress  HEENT: visual acuity grossly intact  CV: HRRR  Lungs: CTA bilaterally  Psych: pleasant and cooperative, no obvious depression or anxiety  ABD: BS+, soft, NTTP  Cognitive function grossly intact  See patient instructions for recommendations.  Education and counseling regarding the above review of health provided with a plan for the following: -see scanned patient completed form for further details -fall prevention strategies discussed  -healthy lifestyle discussed -importance and resources for completing advanced directives discussed -see patient instructions below for any other recommendations provided  4)The following written screening schedule of preventive measures were reviewed with assessment and plan made per below, orders and patient instructions:         Alcohol screening done     Obesity Screening and counseling done     STI screening (Hep C if born [id number]) offered and per pt wishes     Tobacco Screening done       Pneumococcal (PPSV23 -one dose after 64, one before if risk factors), influenza yearly and hepatitis B vaccines (if high risk - end stage renal disease, IV drugs, homosexual men, live in home for mentally retarded, hemophilia receiving factors) ASSESSMENT/PLAN: done      Screening mammograph (yearly if >40) ASSESSMENT/PLAN: she has not done, advised last year and advised again today, declined breast exam, she agrees to schedule at breast center - number provided to call.      Screening  Pap smear/pelvic exam (q2 years) ASSESSMENT/PLAN: n/a, declined      Colorectal cancer screening (FOBT yearly or flex sig q4y or colonoscopy q10y or barium enema q4y) ASSESSMENT/PLAN: utd per pt, reports normal in 2013      Diabetes outpatient self-management training services ASSESSMENT/PLAN: n/a      Bone mass measurements(covered q2y if indicated - estrogen def, osteoporosis, hyperparathyroid, vertebral abnormalities, osteoporosis or steroids) ASSESSMENT/PLAN: declined in the past, agreed today and order placed, advised Vit D3 and regular weight bearing exercise      Screening for glaucoma(q1y if high risk - diabetes, FH, AA and > 50 or hispanic and > 65) ASSESSMENT/PLAN: utd or advised      Medical nutritional therapy for individuals with diabetes or renal disease ASSESSMENT/PLAN: n/a      Cardiovascular screening blood tests (lipids q5y) ASSESSMENT/PLAN:  see orders and labs      Diabetes screening tests ASSESSMENT/PLAN: see orders and labs   7.) Summary:   Medicare annual wellness visit, subsequent -risk factors and conditions per above assessment were discussed and treatment, recommendations and referrals were offered per documentation above and orders and patient instructions.  Hyperlipidemia, unspecified hyperlipidemia type - Plan: Lipid Panel -on statin, healthy diet and exercise advised  Hyperglycemia - Plan: Hemoglobin A1c -screening today  Estrogen deficiency - Plan: DG Bone Density -discussed options for eval and treatment, she wants to do bone density, prefers to start with Vit D and exercise if any issues rather then medications  Constipation, unspecified constipation type -chronic, unchanged per pt -trial daily fiber supplement and mirilax prn, advised she notify us if not resolved with these measures or new concerns  Patient Instructions  BEFORE YOU LEAVE: -labs -follow up: 6 months   Please call the breast center today to schedule your mammogram and  the bone density test.  Vit D3 [phone] IU daily. Source naturals drops are a good choice.  Call the insurance company about your shingles vaccine.  Take metamucil fiber supplement powder in water every morning. Use mirilax if 2 days without a bowel movement or signs of constipation. Follow up if this issue persists.  We have ordered labs or studies at this visit. It can take up to 1-2 weeks for results and processing. IF results require follow up or explanation, we will call you with instructions. Clinically stable results will be released to your [facility]. If you have not heard from [place] or cannot find your results in [facility] in 2 weeks please contact our office at [phone].  If you are not yet signed up for [facility], please consider signing up.   We recommend the following healthy lifestyle:  1) Eat a healthy clean diet.  * Tip: Avoid (less then 1 serving per week): processed foods, sweets, sweetened drinks, white starches (rice, flour, bread, potatoes, pasta, etc), red meat, fast foods, butter  *Tip: CHOOSE instead   * 5-9 servings per day of fresh or frozen fruits and vegetables (but not corn, potatoes, bananas, canned or dried fruit)   *nuts and seeds, beans   *olives and olive oil   *small portions of lean meats such as fish and white chicken    *small portions of whole grains  2)Get at least 150 minutes of sweaty aerobic exercise per week.  3)Reduce stress - consider counseling, meditation and relaxation to balance other aspects of your life.         Kriste Basque R., DO

## 2015-12-25 ENCOUNTER — Encounter: Payer: Self-pay | Admitting: Family Medicine

## 2015-12-25 ENCOUNTER — Ambulatory Visit (INDEPENDENT_AMBULATORY_CARE_PROVIDER_SITE_OTHER): Payer: Medicare Other | Admitting: Family Medicine

## 2015-12-25 VITALS — BP 132/80 | HR 75 | Temp 98.5°F | Ht 66.75 in | Wt 155.6 lb

## 2015-12-25 DIAGNOSIS — R739 Hyperglycemia, unspecified: Secondary | ICD-10-CM

## 2015-12-25 DIAGNOSIS — Z0000 Encounter for general adult medical examination without abnormal findings: Secondary | ICD-10-CM

## 2015-12-25 DIAGNOSIS — E2839 Other primary ovarian failure: Secondary | ICD-10-CM | POA: Diagnosis not present

## 2015-12-25 DIAGNOSIS — E785 Hyperlipidemia, unspecified: Secondary | ICD-10-CM

## 2015-12-25 DIAGNOSIS — K5900 Constipation, unspecified: Secondary | ICD-10-CM

## 2015-12-25 LAB — LIPID PANEL
CHOL/HDL RATIO: 4
Cholesterol: 257 mg/dL — ABNORMAL HIGH (ref 0–200)
HDL: 68 mg/dL (ref >39.00)
LDL CALC: 158 mg/dL — AB (ref 0–99)
NonHDL: 189.36
TRIGLYCERIDES: 159 mg/dL — AB (ref 0.0–149.0)
VLDL: 31.8 mg/dL (ref 0.0–40.0)

## 2015-12-25 LAB — HEMOGLOBIN A1C: Hgb A1c MFr Bld: 4.9 % (ref 4.6–6.5)

## 2015-12-25 MED ORDER — OMEPRAZOLE 20 MG PO CPDR: 20.0000 mg | DELAYED_RELEASE_CAPSULE | Freq: Every day | ORAL | 3 refills | Status: DC

## 2015-12-25 NOTE — Patient Instructions (Signed)
BEFORE YOU LEAVE: -labs -follow up: 6 months   Please call the breast center today to schedule your mammogram and the bone density test.  Vit D3 [phone] IU daily. Source naturals drops are a good choice.  Call the insurance company about your shingles vaccine.  Take metamucil fiber supplement powder in water every morning. Use mirilax if 2 days without a bowel movement or signs of constipation. Follow up if this issue persists.  We have ordered labs or studies at this visit. It can take up to 1-2 weeks for results and processing. IF results require follow up or explanation, we will call you with instructions. Clinically stable results will be released to your [facility]. If you have not heard from us or cannot find your results in [facility] in 2 weeks please contact our office at [phone].  If you are not yet signed up for [facility], please consider signing up.   We recommend the following healthy lifestyle:  1) Eat a healthy clean diet.  * Tip: Avoid (less then 1 serving per week): processed foods, sweets, sweetened drinks, white starches (rice, flour, bread, potatoes, pasta, etc), red meat, fast foods, butter  *Tip: CHOOSE instead   * 5-9 servings per day of fresh or frozen fruits and vegetables (but not corn, potatoes, bananas, canned or dried fruit)   *nuts and seeds, beans   *olives and olive oil   *small portions of lean meats such as fish and white chicken    *small portions of whole grains  2)Get at least 150 minutes of sweaty aerobic exercise per week.  3)Reduce stress - consider counseling, meditation and relaxation to balance other aspects of your life.

## 2015-12-25 NOTE — Progress Notes (Signed)
Pre visit review using our clinic review tool, if applicable. No additional management support is needed unless otherwise documented below in the visit note. 

## 2016-02-23 ENCOUNTER — Other Ambulatory Visit (HOSPITAL_COMMUNITY): Payer: Self-pay

## 2016-02-23 DIAGNOSIS — F320 Major depressive disorder, single episode, mild: Secondary | ICD-10-CM

## 2016-02-23 MED ORDER — LORAZEPAM 0.5 MG PO TABS: 0.5000 mg | ORAL_TABLET | Freq: Every day | ORAL | 0 refills | Status: DC

## 2016-02-27 ENCOUNTER — Ambulatory Visit (HOSPITAL_COMMUNITY): Payer: Self-pay | Admitting: Psychiatry

## 2016-03-04 ENCOUNTER — Encounter (HOSPITAL_COMMUNITY): Payer: Self-pay | Admitting: Psychiatry

## 2016-03-04 ENCOUNTER — Ambulatory Visit (INDEPENDENT_AMBULATORY_CARE_PROVIDER_SITE_OTHER): Payer: Medicare Other | Admitting: Psychiatry

## 2016-03-04 DIAGNOSIS — F331 Major depressive disorder, recurrent, moderate: Secondary | ICD-10-CM

## 2016-03-04 DIAGNOSIS — Z79899 Other long term (current) drug therapy: Secondary | ICD-10-CM | POA: Diagnosis not present

## 2016-03-04 MED ORDER — ROPINIROLE HCL 2 MG PO TABS: 2.0000 mg | ORAL_TABLET | Freq: Every day | ORAL | 0 refills | Status: DC

## 2016-03-04 MED ORDER — VENLAFAXINE HCL ER 150 MG PO CP24: 150.0000 mg | ORAL_CAPSULE | Freq: Every day | ORAL | 1 refills | Status: DC
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
## 2016-06-23 NOTE — Progress Notes (Signed)
HPI:  Follow up. PMH MDD (sees psychiatry), RLS, constipation, GERD and hyperlipidemia. She is on a statin, requip for the restless leg, low dose PPI and several psych medications. Reports doing well for the most part. Did forget to fast this morning. Lifestyle unchanged. Bowels, GERD symptoms, leg symptoms improved. Has irritation R corner of eye for about 1 week and wants me to check for stye as she reports has had sty in the past. 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 Unchanged. Uses light panty liners. Wonders about treatment options.Denies worsening depression, change in symptoms, fevers, muck in eye, vision changes. Due for repeat cholesterol. AWV in October.  ROS: See pertinent positives and negatives per HPI.  Past Medical History:  Diagnosis Date  . Arthritis   . Chicken pox   . Depression   . GERD (gastroesophageal reflux disease)   . High cholesterol   . Restless leg syndrome   . Sleep apnea    borderline  . Urine incontinence   . UTI (urinary tract infection)     Past Surgical History:  Procedure Laterality Date  . APPENDECTOMY  02/2015   LAPROSCOPIC   . COLONOSCOPY  2012  . LAPAROSCOPIC APPENDECTOMY N/A 03/27/2015   Procedure: APPENDECTOMY LAPAROSCOPIC;  Surgeon: Emelia Loron, MD;  Location: [facility] OR;  Service: General;  Laterality: N/A;  . TONSILLECTOMY AND ADENOIDECTOMY  1954    Family History  Problem Relation Age of Onset  . Hyperlipidemia Mother   . Hypertension Mother   . Mental illness Mother   . Stroke Mother   . Thyroid disease Mother   . Depression Mother   . Kidney disease Father   . Mental illness Father   . Alcoholism Father   . Hepatitis Father   . Sudden death Father   . Alcohol abuse Father   . Stroke Maternal Grandfather     Social History   Social History  . Marital status: Married    Spouse name: N/A  . Number of children: N/A  . Years of education: N/A   Social History Main Topics  . Smoking status: Former Games developer  .  Smokeless tobacco: Never Used     Comment: QUIT SMOKING  IN 1989  . Alcohol use 1.2 oz/week    2 Glasses of wine per week     Comment: glass of wine once per day  . Drug use: No  . Sexual activity: Not Currently   Other Topics Concern  . 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  omeprazole (PRILOSEC) 20 MG capsule, Take 1 capsule (20 mg total) by mouth daily., Disp: 90 capsule, Rfl: 3 .  pravastatin (PRAVACHOL) 40 MG tablet, take 1 tablet by mouth once daily, Disp: 90 tablet, Rfl: 1 .  rOPINIRole (REQUIP) 2 MG tablet, Take 1 tablet (2 mg total) by mouth at bedtime., Disp: 90 tablet, Rfl: 0 .  venlafaxine XR (EFFEXOR-XR) 150 MG 24 hr capsule, Take 1 capsule (150 mg total) by mouth daily with breakfast., Disp: 90 capsule, Rfl: 1  EXAM:  Vitals:   06/24/16 1009  BP: 116/76  Pulse: 77  Temp: 98.7 F (37.1 C)    Body mass index is 25.65 kg/m.  GENERAL: vitals reviewed and listed above, alert, oriented, appears well hydrated and in no acute distress  HEENT: atraumatic, conjunttiva clear, small curled eyelash R corner in the lid, no obvious abnormalities on inspection of external nose and ears  NECK: no obvious masses on inspection  LUNGS: clear to auscultation bilaterally, no wheezes, rales or rhonchi, good air movement  CV: HRRR, no peripheral edema  MS: moves all extremities without noticeable abnormality  PSYCH: pleasant and cooperative, no obvious depression or anxiety  ASSESSMENT AND PLAN:  Discussed the following assessment and plan:  Eye irritation -eyelash removed -advised follow up with her opthomologist if symptoms persist  Mixed stress and urge urinary incontinence -discussed tx options and risks -she is going to try kegels and timed  voiding first  Restless leg -cont current tx  Gastroesophageal reflux disease, esophagitis presence not specified -cont current tx  Episode of recurrent major depressive disorder, unspecified depression episode severity (HCC) -sees psych for management  Hyperlipidemia, unspecified hyperlipidemia type - Plan: Lipid panel -fasting lipid panel -lifestyle recs  -Patient advised to return or notify a doctor immediately if symptoms worsen or persist or new concerns arise.  Patient Instructions  BEFORE YOU LEAVE: -follow up: 1) lab visit for fasting cholesterol check in next 1-2 weeks. Lab order done. 2) Medicare exam with Dr. Selena Batten in October   Try the Kegel exercises and timed voiding. Always incontinence underwear can be helpful also.   We have ordered labs or studies at this visit. It can take up to 1-2 weeks for results and processing. IF results require follow up or explanation, we will call you with instructions. Clinically stable results will be released to your [facility]. If you have not heard from [place] or cannot find your results in [facility] in 2 weeks please contact our office at [phone].  If you are not yet signed up for [facility], please consider signing up.  Advise regular aerobic exercise (at least 150 minutes per week of sweaty exercise) and a healthy diet. Try to eat at least 5-9 servings of vegetables and fruits per day (not corn, potatoes or bananas.) Avoid sweets, red meat, pork, butter, fried foods, fast food, processed food, excessive dairy, eggs and coconut. Replace bad fats with good fats - fish, nuts and seeds, canola oil, olive oil.   WE NOW OFFER    [facility] FAST TRACK!!!  SAME DAY Appointments for ACUTE CARE  Such as: Sprains, Injuries, cuts, abrasions, rashes, muscle pain, joint pain, back pain Colds, flu, sore throats, headache, allergies, cough, fever  Ear pain, sinus and eye infections Abdominal pain, nausea, vomiting, diarrhea, upset  stomach Animal/insect bites  3 Easy Ways to Schedule: Walk-In Scheduling Call in scheduling Mychart Sign-up: [email or web address]                Kriste Basque R., DO

## 2016-06-24 ENCOUNTER — Ambulatory Visit (INDEPENDENT_AMBULATORY_CARE_PROVIDER_SITE_OTHER): Payer: Medicare Other | Admitting: Family Medicine

## 2016-06-24 ENCOUNTER — Encounter: Payer: Self-pay | Admitting: Family Medicine

## 2016-06-24 VITALS — BP 116/76 | HR 77 | Temp 98.7°F | Ht 67.0 in | Wt 163.8 lb

## 2016-06-24 DIAGNOSIS — H578 Other specified disorders of eye and adnexa: Secondary | ICD-10-CM | POA: Diagnosis not present

## 2016-06-24 DIAGNOSIS — E785 Hyperlipidemia, unspecified: Secondary | ICD-10-CM | POA: Diagnosis not present

## 2016-06-24 DIAGNOSIS — G2581 Restless legs syndrome: Secondary | ICD-10-CM

## 2016-06-24 DIAGNOSIS — N3946 Mixed incontinence: Secondary | ICD-10-CM

## 2016-06-24 DIAGNOSIS — F339 Major depressive disorder, recurrent, unspecified: Secondary | ICD-10-CM

## 2016-06-24 DIAGNOSIS — K219 Gastro-esophageal reflux disease without esophagitis: Secondary | ICD-10-CM | POA: Diagnosis not present

## 2016-06-24 DIAGNOSIS — H5789 Other specified disorders of eye and adnexa: Secondary | ICD-10-CM

## 2016-06-24 NOTE — Patient Instructions (Signed)
BEFORE YOU LEAVE: -follow up: 1) lab visit for fasting cholesterol check in next 1-2 weeks. Lab order done. 2) Medicare exam with Dr. Selena Batten in October   Try the Kegel exercises and timed voiding. Always incontinence underwear can be helpful also.   We have ordered labs or studies at this visit. It can take up to 1-2 weeks for results and processing. IF results require follow up or explanation, we will call you with instructions. Clinically stable results will be released to your [facility]. If you have not heard from [place] or cannot find your results in [facility] in 2 weeks please contact our office at [phone].  If you are not yet signed up for [facility], please consider signing up.  Advise regular aerobic exercise (at least 150 minutes per week of sweaty exercise) and a healthy diet. Try to eat at least 5-9 servings of vegetables and fruits per day (not corn, potatoes or bananas.) Avoid sweets, red meat, pork, butter, fried foods, fast food, processed food, excessive dairy, eggs and coconut. Replace bad fats with good fats - fish, nuts and seeds, canola oil, olive oil.   WE NOW OFFER   [facility] FAST TRACK!!!  SAME DAY Appointments for ACUTE CARE  Such as: Sprains, Injuries, cuts, abrasions, rashes, muscle pain, joint pain, back pain Colds, flu, sore throats, headache, allergies, cough, fever  Ear pain, sinus and eye infections Abdominal pain, nausea, vomiting, diarrhea, upset stomach Animal/insect bites  3 Easy Ways to Schedule: Walk-In Scheduling Call in scheduling Mychart Sign-up: [email or web address]

## 2016-06-24 NOTE — Progress Notes (Signed)
Pre visit review using our clinic review tool, if applicable. No additional management support is needed unless otherwise documented below in the visit note. 

## 2016-07-08 ENCOUNTER — Other Ambulatory Visit (INDEPENDENT_AMBULATORY_CARE_PROVIDER_SITE_OTHER): Payer: Medicare Other

## 2016-07-08 DIAGNOSIS — E785 Hyperlipidemia, unspecified: Secondary | ICD-10-CM

## 2016-07-08 LAB — LIPID PANEL
CHOL/HDL RATIO: 5
CHOLESTEROL: 306 mg/dL — AB (ref 0–200)
HDL: 57.8 mg/dL (ref >39.00)
LDL CALC: 208 mg/dL — AB (ref 0–99)
NonHDL: 247.75
Triglycerides: 199 mg/dL — ABNORMAL HIGH (ref 0.0–149.0)
VLDL: 39.8 mg/dL (ref 0.0–40.0)

## 2016-07-26 ENCOUNTER — Other Ambulatory Visit: Payer: Self-pay | Admitting: *Deleted

## 2016-07-26 MED ORDER — ROSUVASTATIN CALCIUM 20 MG PO TABS: 20.0000 mg | ORAL_TABLET | Freq: Every day | ORAL | 1 refills | Status: DC

## 2016-07-31 IMAGING — CT CT ABD-PELV W/ CM
2 of 5 series · 15 of 46 positions shown, 17 images · IV contrast (omnipaque)
Comparison: None.

CLINICAL DATA: 74-year-old presenting with acute onset of right
lower quadrant abdominal pain yesterday which has progressively
worsened.

EXAM:
CT ABDOMEN AND PELVIS WITH CONTRAST
TECHNIQUE: Multidetector CT imaging of the abdomen and pelvis was performed
using the standard protocol following bolus administration of
intravenous contrast.
CONTRAST:  100mL OMNIPAQUE IOHEXOL 300 MG/ML IV. Oral contrast was
not administered.

[Series 2: a/p w/ 5mm · axial · 0.71mm/px · z∈[+800,+1245]mm · 12 of 101 slices shown, 14 images]
[im 6/101  soft-tissue]
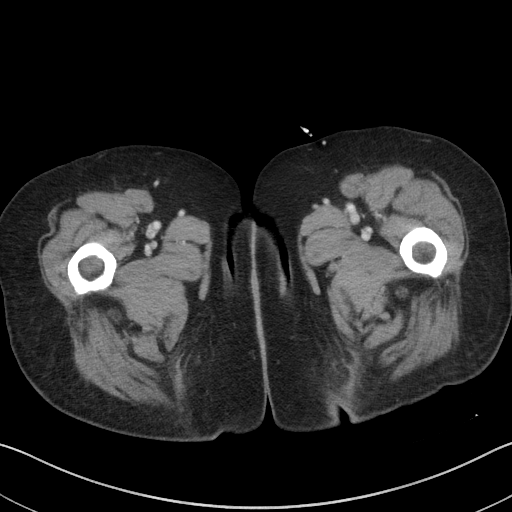
[im 6/101  bone]
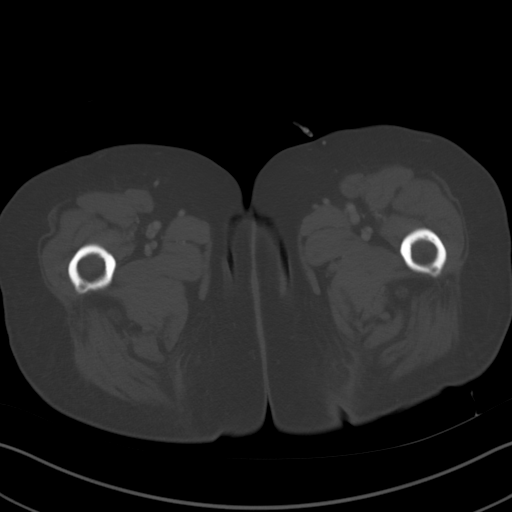
[im 17/101  soft-tissue]
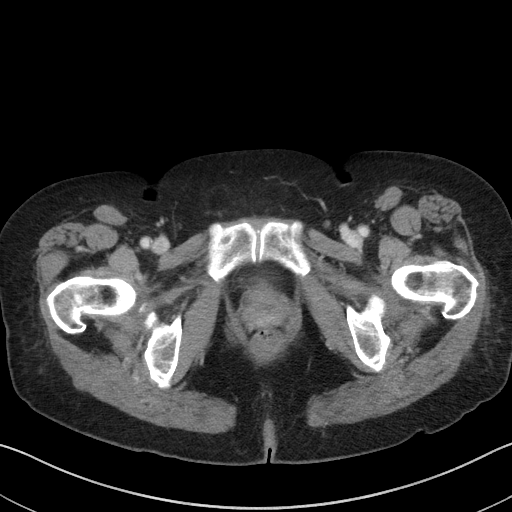
[im 23/101  soft-tissue]
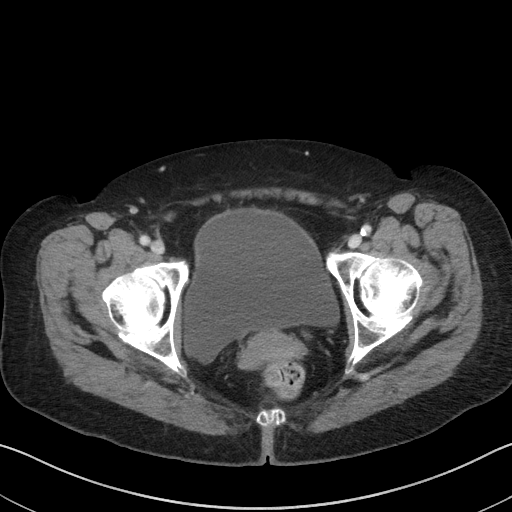
[im 28/101  soft-tissue]
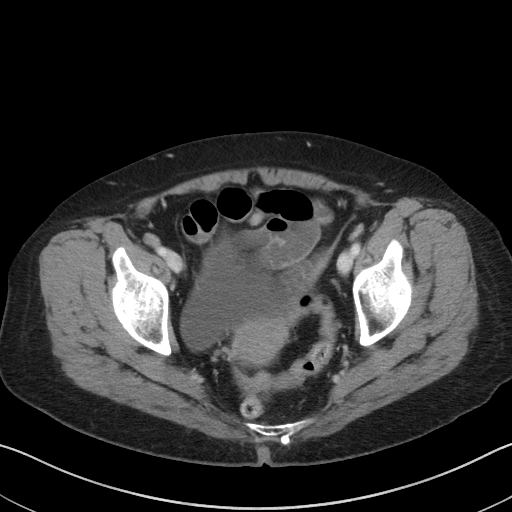
[im 39/101  soft-tissue]
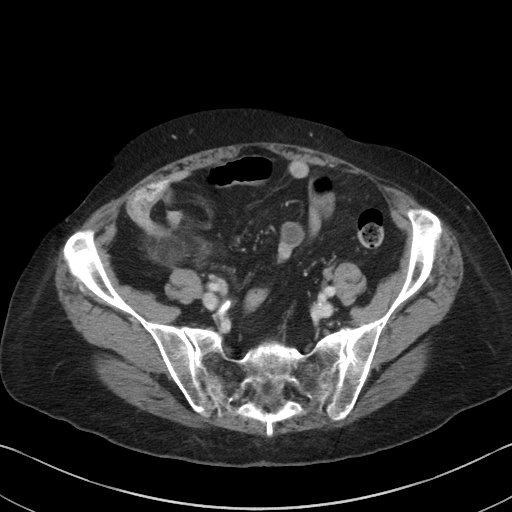
[im 45/101  soft-tissue]
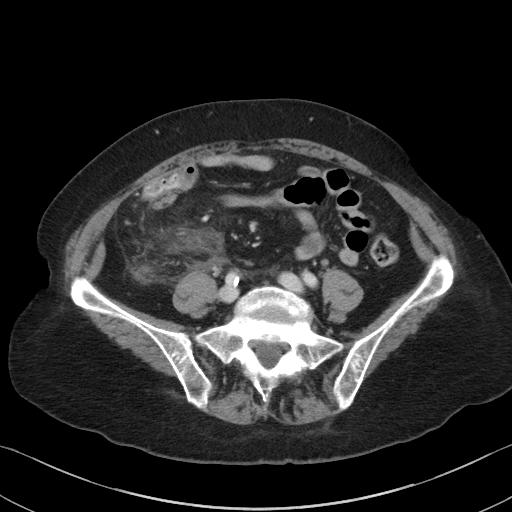
[im 56/101  soft-tissue]
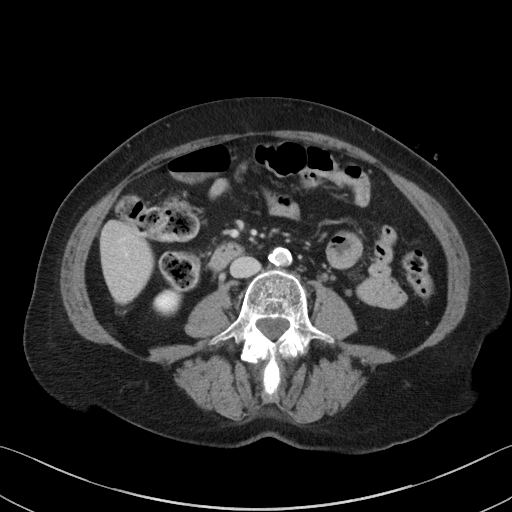
[im 62/101  soft-tissue]
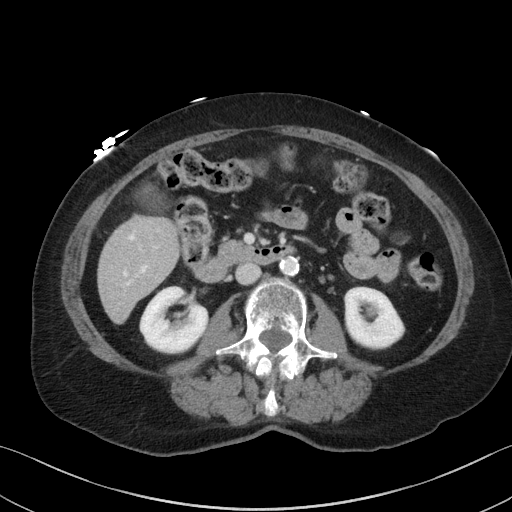
[im 73/101  soft-tissue]
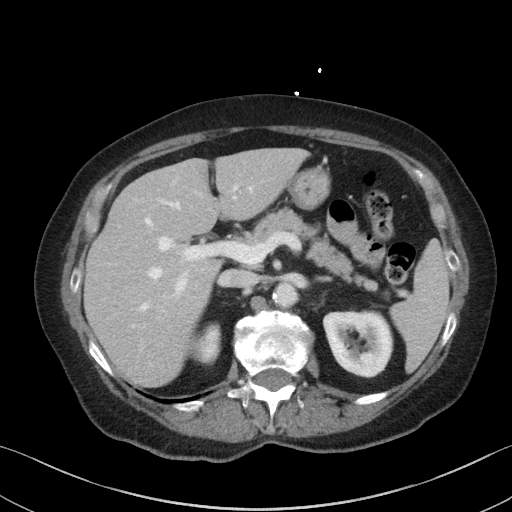
[im 73/101  bone]
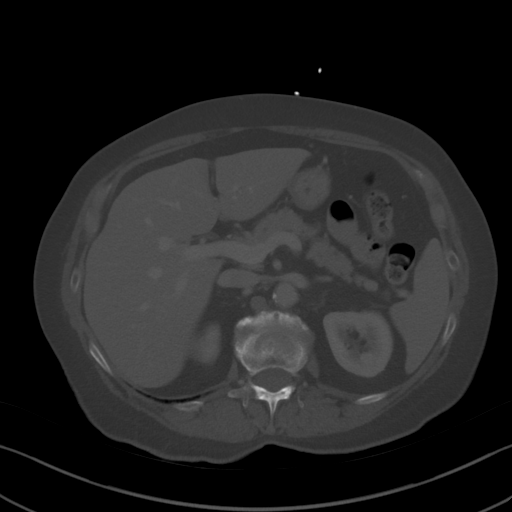
[im 78/101  soft-tissue]
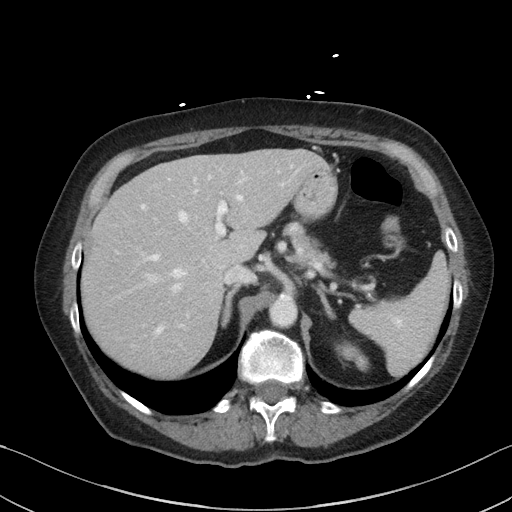
[im 84/101  soft-tissue]
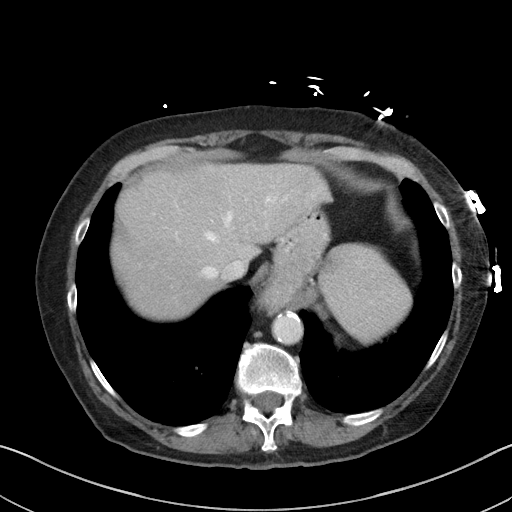
[im 95/101  soft-tissue]
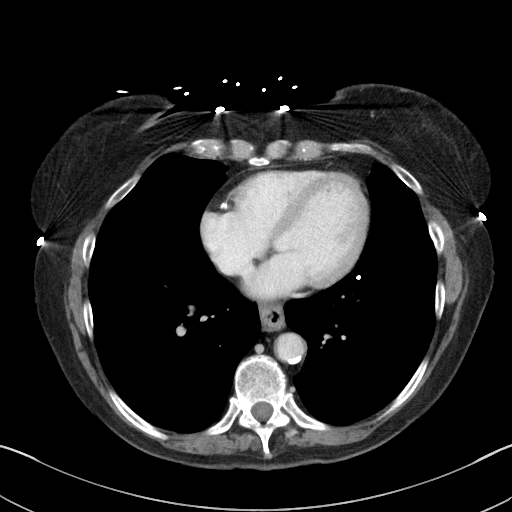

[Series 5: a/p w/ cor · coronal · 0.79mm/px · 3 of 139 slices shown]
[im 47/139  soft-tissue]
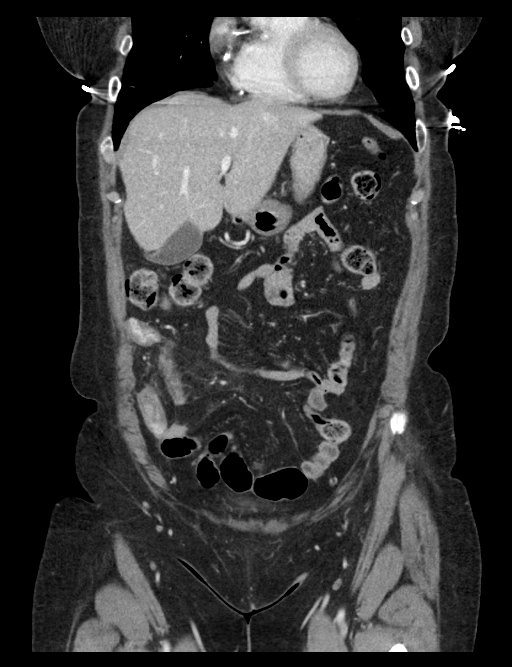
[im 62/139  soft-tissue]
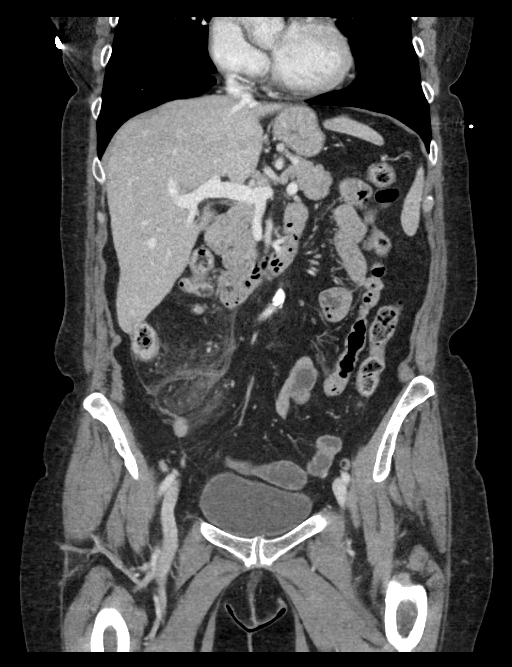
[im 77/139  soft-tissue]
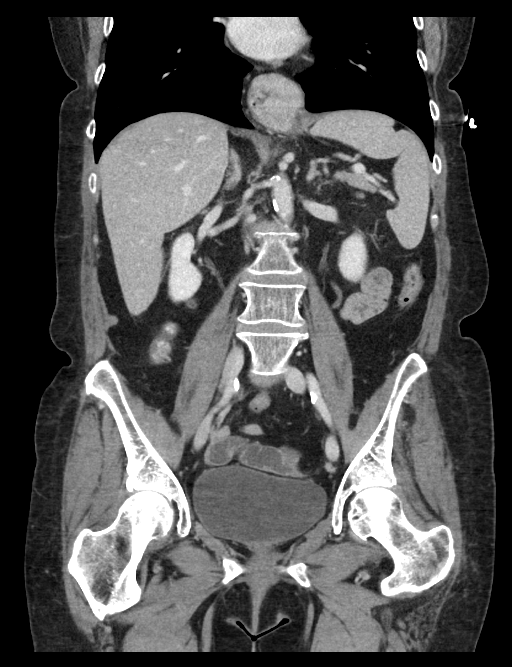

[15 of 46 positions shown; findings below may reference images not displayed]

FINDINGS: Lower chest: Scarring involving the right middle lobe. Minimal
dependent atelectasis in the left lower lobe. Visualized lung bases
otherwise clear. Heart size upper normal with 3 vessel coronary
atherosclerosis.

Hepatobiliary: Liver normal in size and appearance. Gallbladder
normal in appearance without calcified gallstones. No biliary ductal
dilation.

Pancreas: Normal in appearance without evidence of mass, ductal
dilation, or inflammation.

Spleen: Normal in size and appearance.

Adrenals/Urinary Tract: Normal appearing adrenal glands. Cortical
cyst arising from the mid left kidney. No significant abnormality
involving either kidney. No urinary tract calculi or obstruction.
Normal-appearing urinary bladder.

Stomach/Bowel: Dilation of the appendix up to approximately 12 mm
diameter with associated mucosal enhancement. Marked periappendiceal
edema/inflammation. No abnormal fluid collection. No extraluminal
gas.

Moderate-sized hiatal hernia. Stomach otherwise unremarkable.
Normal-appearing small bowel. Entire colon relatively decompressed.
Scattered sigmoid colon diverticula without evidence of acute
diverticulitis. Remainder of the colon unremarkable with expected
stool burden. No ascites.

Vascular/Lymphatic: Severe aortoiliofemoral atherosclerosis without
aneurysm. Visceral arteries atherosclerotic though patent.
Approximate 1.4 cm splenic artery aneurysm near the hilum. No
pathologic lymphadenopathy.

Reproductive: Atrophic uterus consistent with age. No adnexal
masses.

Other: None.

Musculoskeletal: Osseous demineralization. Degenerative disc disease
and spondylosis at L2-3. Facet degenerative changes throughout the
lower lumbar spine. No acute abnormalities.
IMPRESSION: 1. Acute appendicitis.  No evidence of perforation or abscess.
2. Moderate-sized hiatal hernia.
3. Sigmoid colon diverticulosis without evidence of acute
diverticulitis.
4. Severe aortoiliofemoral atherosclerosis without aneurysm.
5. Approximate 1.4 cm splenic artery aneurysm near the hilum.
I telephoned these results at the time of interpretation on
03/27/2015 at [DATE] to Dr. DEANNE TIGER, who verbally acknowledged
these results.

## 2016-08-22 ENCOUNTER — Ambulatory Visit (INDEPENDENT_AMBULATORY_CARE_PROVIDER_SITE_OTHER): Payer: Medicare Other | Admitting: Family Medicine

## 2016-08-22 DIAGNOSIS — M79671 Pain in right foot: Secondary | ICD-10-CM | POA: Diagnosis not present

## 2016-08-22 NOTE — Progress Notes (Signed)
HPI:  R ant fort pain: -started 6 weeks ago -tripped on rug and developed pain, swelling and bruising of dorsal ant R foot -was able to bear weight -improved some with ice and aleve -however, has persisted mild edema and pain - particularly with active days and certain shoes -wants xray -denies weakness, inability to bear wt, ankle pain, numbness  ROS: See pertinent positives and negatives per HPI.  Past Medical History:  Diagnosis Date  . Arthritis   . Chicken pox   . Depression   . GERD (gastroesophageal reflux disease)   . High cholesterol   . Restless leg syndrome   . Sleep apnea    borderline  . Urine incontinence   . UTI (urinary tract infection)     Past Surgical History:  Procedure Laterality Date  . APPENDECTOMY  02/2015   LAPROSCOPIC   . COLONOSCOPY  2012  . LAPAROSCOPIC APPENDECTOMY N/A 03/27/2015   Procedure: APPENDECTOMY LAPAROSCOPIC;  Surgeon: Emelia LoronMatthew Wakefield, MD;  [facility];  Service: General;  Laterality: N/A;  . TONSILLECTOMY AND ADENOIDECTOMY  1954    Family History  Problem Relation Age of Onset  . Hyperlipidemia Mother   . Hypertension Mother   . Mental illness Mother   . Stroke Mother   . Thyroid disease Mother   . Depression Mother   . Kidney disease Father   . Mental illness Father   . Alcoholism Father   . Hepatitis Father   . Sudden death Father   . Alcohol abuse Father   . Stroke Maternal Grandfather     Social History   Social History  . Marital status: Married    Spouse name: N/A  . Number of children: N/A  . Years of education: N/A   Social History Main Topics  . Smoking status: Former Games developermoker  . Smokeless tobacco: Never Used     Comment: QUIT SMOKING  IN 1989  . Alcohol use 1.2 oz/week    2 Glasses of wine per week     Comment: glass of wine once per day  . Drug use: No  . Sexual activity: Not Currently   Other Topics Concern  . Not on file   Social History Narrative   Work or School: retired  Engineer, drillingadministrative assistant      Home Situation: living with spouse      Spiritual Beliefs: none      Lifestyle: no regular exercise; diet is so so              Current Outpatient Prescriptions:  .  omeprazole (PRILOSEC) 20 MG capsule, Take 1 capsule (20 mg total) by mouth daily., Disp: 90 capsule, Rfl: 3 .  rOPINIRole (REQUIP) 2 MG tablet, Take 1 tablet (2 mg total) by mouth at bedtime., Disp: 90 tablet, Rfl: 0 .  rosuvastatin (CRESTOR) 20 MG tablet, Take 1 tablet (20 mg total) by mouth daily., Disp: 90 tablet, Rfl: 1 .  venlafaxine XR (EFFEXOR-XR) 150 MG 24 hr capsule, Take 1 capsule (150 mg total) by mouth daily with breakfast., Disp: 90 capsule, Rfl: 1  EXAM:  There were no vitals filed for this visit.  There is no height or weight on file to calculate BMI.  GENERAL: vitals reviewed and listed above, alert, oriented, appears well hydrated and in no acute distress  HEENT: atraumatic, conjunttiva clear, no obvious abnormalities on inspection of external nose and ears  NECK: no obvious masses on inspection  MS: moves all extremities without noticeable abnormality, soft swelling and  resolving echymosis over R ant dorsal foot, difuse mild TTP in this area, normal pedal pulses/stength/sensitivity to light touch in foot/ankle/toes, mild hammertoe 2nd digit R foot  PSYCH: pleasant and cooperative, no obvious depression or anxiety  ASSESSMENT AND PLAN:  Discussed the following assessment and plan:  Right foot pain - Plan: DG Foot Complete Right  -discussed potential etiologies, potential options for further evaluation and managent and return precuations -will get plain films to further eval -further recs pending -Patient advised to return or notify a doctor immediately if symptoms worsen or persist or new concerns arise.  There are no Patient Instructions on file for this visit.  Kriste Basque R., DO

## 2016-09-02 ENCOUNTER — Ambulatory Visit (INDEPENDENT_AMBULATORY_CARE_PROVIDER_SITE_OTHER): Payer: Medicare Other | Admitting: Psychiatry

## 2016-09-02 ENCOUNTER — Encounter (HOSPITAL_COMMUNITY): Payer: Self-pay | Admitting: Psychiatry

## 2016-09-02 VITALS — BP 132/78 | HR 70 | Ht 67.0 in | Wt 160.4 lb

## 2016-09-02 DIAGNOSIS — Z818 Family history of other mental and behavioral disorders: Secondary | ICD-10-CM | POA: Diagnosis not present

## 2016-09-02 DIAGNOSIS — F331 Major depressive disorder, recurrent, moderate: Secondary | ICD-10-CM

## 2016-09-02 DIAGNOSIS — Z87891 Personal history of nicotine dependence: Secondary | ICD-10-CM | POA: Diagnosis not present

## 2016-09-02 DIAGNOSIS — Z811 Family history of alcohol abuse and dependence: Secondary | ICD-10-CM | POA: Diagnosis not present

## 2016-09-02 MED ORDER — ROPINIROLE HCL 2 MG PO TABS: 2.0000 mg | ORAL_TABLET | Freq: Every day | ORAL | 1 refills | Status: DC

## 2016-09-02 MED ORDER — VENLAFAXINE HCL ER 150 MG PO CP24: 150.0000 mg | ORAL_CAPSULE | Freq: Every day | ORAL | 1 refills | Status: DC
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
## 2016-11-14 ENCOUNTER — Encounter: Payer: Self-pay | Admitting: Family Medicine

## 2016-11-25 ENCOUNTER — Ambulatory Visit: Payer: Self-pay | Admitting: Family Medicine

## 2016-11-29 ENCOUNTER — Ambulatory Visit (INDEPENDENT_AMBULATORY_CARE_PROVIDER_SITE_OTHER): Payer: Medicare Other | Admitting: Family Medicine

## 2016-11-29 ENCOUNTER — Encounter: Payer: Self-pay | Admitting: Family Medicine

## 2016-11-29 VITALS — BP 120/60 | HR 63 | Temp 98.3°F | Ht 67.0 in | Wt 164.7 lb

## 2016-11-29 DIAGNOSIS — Z0000 Encounter for general adult medical examination without abnormal findings: Secondary | ICD-10-CM

## 2016-11-29 DIAGNOSIS — Z23 Encounter for immunization: Secondary | ICD-10-CM

## 2016-11-29 DIAGNOSIS — E2839 Other primary ovarian failure: Secondary | ICD-10-CM

## 2016-11-29 DIAGNOSIS — F339 Major depressive disorder, recurrent, unspecified: Secondary | ICD-10-CM

## 2016-11-29 DIAGNOSIS — E785 Hyperlipidemia, unspecified: Secondary | ICD-10-CM | POA: Diagnosis not present

## 2016-11-29 DIAGNOSIS — R739 Hyperglycemia, unspecified: Secondary | ICD-10-CM

## 2016-11-29 NOTE — Progress Notes (Signed)
Medicare Annual Preventive Care Visit  (initial annual wellness or annual wellness exam)  Concerns and/or follow up today:  Due for mammogram, labs, bone density, flu shot.  Past medical history is significant for major depressive disorder, managed by her psychiatrist, acid reflux, chronic constipation, restless leg syndrome, hyperglycemia and hyperlipidemia. We ordered a bone density test at her last wellness visit. At her last wellness visit we advised changing to Crestor as her cholesterol was quite high.she reports she is tolerating the cholesterol medicine well. We also recommended a healthy Mediterranean style diet and regular exercise. Her colon cancer screening is up-to-date. The patient at her last wellness visit, she reported it was normal in 2013. At her wellness visit last year she agreed to schedule her mammogram. On review of chart it does not look like she completed the bone density screen or the mammogram. Reports she will complete her mammogram and does want to go ahead and do the bone density test. She reports she tries to get some exercise and tries to eat healthy. Not taking any vitamin D. She does want her flu shot today.  See HM section in Epic for other details of completed HM. See scanned documentation under Media Tab for further documentation HPI, health risk assessment. See Media Tab and Care Teams sections in Epic for other providers.  ROS: negative for report of fevers, unintentional weight loss, vision changes, vision loss, hearing loss or change, chest pain, sob, hemoptysis, melena, hematochezia, hematuria, genital discharge or lesions, falls, bleeding or bruising, loc, thoughts of suicide or self harm, memory loss  1.) Patient-completed health risk assessment  - completed and reviewed, see scanned documentation  2.) Review of Medical History: -PMH, PSH, Family History and current specialty and care providers reviewed and updated and listed below  - see scanned  in document in chart and below  Past Medical History:  Diagnosis Date  . Arthritis   . Chicken pox   . Depression   . GERD (gastroesophageal reflux disease)   . High cholesterol   . Restless leg syndrome   . Sleep apnea    borderline  . Urine incontinence   . UTI (urinary tract infection)     Past Surgical History:  Procedure Laterality Date  . APPENDECTOMY  02/2015   LAPROSCOPIC   . COLONOSCOPY  2012  . LAPAROSCOPIC APPENDECTOMY N/A 03/27/2015   Procedure: APPENDECTOMY LAPAROSCOPIC;  Surgeon: Rolm Bookbinder, MD;  Location: [facility];  Service: General;  Laterality: N/A;  . [place]    Social History   Social History  . Marital status: Married    Spouse name: N/A  . Number of children: N/A  . Years of education: N/A   Occupational History  . Not on file.   Social History Main Topics  . Smoking status: Former Research scientist (life sciences)  . Smokeless tobacco: Never Used     Comment: QUIT SMOKING  IN 1989  . Alcohol use 1.2 oz/week    2 Glasses of wine per week     Comment: glass of wine once per day  . Drug use: No  . Sexual activity: Not Currently   Other Topics Concern  . Not on file   Social History Narrative   Work or School: retired Geneticist, molecular Situation: living with spouse      Spiritual Beliefs: none      Lifestyle: no regular exercise; diet is so so  Family History  Problem Relation Age of Onset  . Hyperlipidemia Mother   . Hypertension Mother   . Mental illness Mother   . Stroke Mother   . Thyroid disease Mother   . Depression Mother   . Kidney disease Father   . Mental illness Father   . Alcoholism Father   . Hepatitis Father   . Sudden death Father   . Alcohol abuse Father   . Stroke Maternal Grandfather     Current Outpatient Prescriptions on File Prior to Visit  Medication Sig Dispense Refill  . omeprazole (PRILOSEC) 20 MG capsule Take 1 capsule (20 mg total) by mouth daily. 90 capsule  3  . rOPINIRole (REQUIP) 2 MG tablet Take 1 tablet (2 mg total) by mouth at bedtime. 90 tablet 1  . rosuvastatin (CRESTOR) 20 MG tablet Take 1 tablet (20 mg total) by mouth daily. 90 tablet 1  . venlafaxine XR (EFFEXOR-XR) 150 MG 24 hr capsule Take 1 capsule (150 mg total) by mouth daily with breakfast. 90 capsule 1   No current facility-administered medications on file prior to visit.      3.) Review of functional ability and level of safety:  Any difficulty hearing?  See scanned documentation  History of falling?  See scanned documentation  Any trouble with IADLs - using a phone, using transportation, grocery shopping, preparing meals, doing housework, doing laundry, taking medications and managing money?  See scanned documentation  Advance Directives?  Discussed briefly and offered more resources and detailed discussion with our trained staff.   See summary of recommendations in Patient Instructions below.  4.) Physical Exam Vitals:   11/29/16 1425  BP: 120/60  Pulse: 63  Temp: 98.3 F (36.8 C)   Estimated body mass index is 25.8 kg/m as calculated from the following:   Height as of this encounter: 5' 7"  (1.702 m).   Weight as of this encounter: 164 lb 11.2 oz (74.7 kg).  EKG (optional): deferred  General: alert, appear well hydrated and in no acute distress  HEENT: visual acuity grossly intact  CV: HRRR  Lungs: CTA bilaterally  Psych: pleasant and cooperative, no obvious depression or anxiety  Cognitive function grossly intact  See patient instructions for recommendations.  Education and counseling regarding the above review of health provided with a plan for the following: -see scanned patient completed form for further details -fall prevention strategies discussed  -healthy lifestyle discussed -importance and resources for completing advanced directives discussed -see patient instructions below for any other recommendations provided  4)The following  written screening schedule of preventive measures were reviewed with assessment and plan made per below, orders and patient instructions:      AAA screening done if applicable     Alcohol screening done     Obesity Screening and counseling done     STI screening (Hep C if born [birth year]) offered and per pt wishes     Tobacco Screening done done       Pneumococcal (PPSV23 -one dose after 64, one before if risk factors), influenza yearly and hepatitis B vaccines (if high risk - end stage renal disease, IV drugs, homosexual men, live in home for mentally retarded, hemophilia receiving factors) ASSESSMENT/PLAN: done if applicable      Screening mammograph (yearly if >40) ASSESSMENT/PLAN: utd or ordered- advised in 2017 and she did not do - advised again today, she agrees to schedule      Screening Pap smear/pelvic exam (q2 years) ASSESSMENT/PLAN: n/a, declined  Colorectal cancer screening (FOBT yearly or flex sig q4y or colonoscopy q10y or barium enema q4y) ASSESSMENT/PLAN: 2017 she reports she had a colonoscopy in 2013 that was normal and all normal in the past      Diabetes outpatient self-management training services ASSESSMENT/PLAN: utd or done      Bone mass measurements(covered q2y if indicated - estrogen def, osteoporosis, hyperparathyroid, vertebral abnormalities, osteoporosis or steroids) ASSESSMENT/PLAN: we ordered a bone density test for her in 2017, she did not complete this - will have assistant reorder      Screening for glaucoma(q1y if high risk - diabetes, FH, AA and > 50 or hispanic and > 65) ASSESSMENT/PLAN: utd or advised      Medical nutritional therapy for individuals with diabetes or renal disease ASSESSMENT/PLAN: see orders      Cardiovascular screening blood tests (lipids q5y) ASSESSMENT/PLAN: see orders and labs      Diabetes screening tests ASSESSMENT/PLAN: see orders and labs   7.) Summary:   Medicare annual wellness visit, subsequent -risk factors  and conditions per above assessment were discussed and treatment, recommendations and referrals were offered per documentation above and orders and patient instructions.  Hyperlipidemia, unspecified hyperlipidemia type - Plan: Lipid panel -tolerating crestor well -recheck lipids -lifestyle recs  Hyperglycemia - Plan: Hemoglobin A1c -lifestyle recs  Episode of recurrent major depressive disorder, unspecified depression episode severity ([place]) -sees psychiatry for management  Estrogen deficiency - Plan: DG Bone Density  Patient Instructions   BEFORE YOU LEAVE: -Phq9 -order bone density for estrogen def -wellness visit papers to complete -flu shot -schedule fasting lab visit -follow up: 6 months  Vit D 3 [phone] IU daily.  Please get your mammogram.  -We placed a referral for you as discussed for the bone density test. It usually takes about 1-2 weeks to process and schedule this referral. If you have not heard from [place] regarding this appointment in 2 weeks please contact our office.   Christina Guerrero , Thank you for taking time to come for your Medicare Wellness Visit. I appreciate your ongoing commitment to your health goals. Please review the following plan we discussed and let me know if I can assist you in the future.   These are the goals we discussed: Goals    Regular exercise Healthy diet Vit D 3 [phone] IU daily Get bone density test Get mammogram      This is a list of the screening recommended for you and due dates:  Health Maintenance  Topic Date Due  . DEXA scan (bone density measurement)  ordered  . Mammogram  12/22/2016  . Tetanus Vaccine  03/17/2019  . Colon Cancer Screening  03/16/2021  . Flu Shot  Completed Today  . Pneumonia vaccines  Completed  *Topic was postponed. The date shown is not the original due date.    Health Maintenance for Postmenopausal Women Menopause is a normal process in which your reproductive ability comes to an end. This  process happens gradually over a span of months to years, usually between the ages of 23 and 24. Menopause is complete when you have missed 12 consecutive menstrual periods. It is important to talk with your health care provider about some of the most common conditions that affect postmenopausal women, such as heart disease, cancer, and bone loss (osteoporosis). Adopting a healthy lifestyle and getting preventive care can help to promote your health and wellness. Those actions can also lower your chances of developing some of these common conditions. What should  I know about menopause? During menopause, you may experience a number of symptoms, such as:  Moderate-to-severe hot flashes.  Night sweats.  Decrease in sex drive.  Mood swings.  Headaches.  Tiredness.  Irritability.  Memory problems.  Insomnia.  Choosing to treat or not to treat menopausal changes is an individual decision that you make with your health care provider. What should I know about hormone replacement therapy and supplements? Hormone therapy products are effective for treating symptoms that are associated with menopause, such as hot flashes and night sweats. Hormone replacement carries certain risks, especially as you become older. If you are thinking about using estrogen or estrogen with progestin treatments, discuss the benefits and risks with your health care provider. What should I know about heart disease and stroke? Heart disease, heart attack, and stroke become more likely as you age. This may be due, in part, to the hormonal changes that your body experiences during menopause. These can affect how your body processes dietary fats, triglycerides, and cholesterol. Heart attack and stroke are both medical emergencies. There are many things that you can do to help prevent heart disease and stroke:  Have your blood pressure checked at least every 1-2 years. High blood pressure causes heart disease and increases  the risk of stroke.  If you are 24-14 years old, ask your health care provider if you should take aspirin to prevent a heart attack or a stroke.  Do not use any tobacco products, including cigarettes, chewing tobacco, or electronic cigarettes. If you need help quitting, ask your health care provider.  It is important to eat a healthy diet and maintain a healthy weight. ? Be sure to include plenty of vegetables, fruits, low-fat dairy products, and lean protein. ? Avoid eating foods that are high in solid fats, added sugars, or salt (sodium).  Get regular exercise. This is one of the most important things that you can do for your health. ? Try to exercise for at least 150 minutes each week. The type of exercise that you do should increase your heart rate and make you sweat. This is known as moderate-intensity exercise. ? Try to do strengthening exercises at least twice each week. Do these in addition to the moderate-intensity exercise.  Know your numbers.Ask your health care provider to check your cholesterol and your blood glucose. Continue to have your blood tested as directed by your health care provider.  What should I know about cancer screening? There are several types of cancer. Take the following steps to reduce your risk and to catch any cancer development as early as possible. Breast Cancer  Practice breast self-awareness. ? This means understanding how your breasts normally appear and feel. ? It also means doing regular breast self-exams. Let your health care provider know about any changes, no matter how small.  If you are 61 or older, have a clinician do a breast exam (clinical breast exam or CBE) every year. Depending on your age, family history, and medical history, it may be recommended that you also have a yearly breast X-ray (mammogram).  If you have a family history of breast cancer, talk with your health care provider about genetic screening.  If you are at high risk  for breast cancer, talk with your health care provider about having an MRI and a mammogram every year.  Breast cancer (BRCA) gene test is recommended for women who have family members with BRCA-related cancers. Results of the assessment will determine the need for genetic counseling  and BRCA1 and for BRCA2 testing. BRCA-related cancers include these types: ? Breast. This occurs in males or females. ? Ovarian. ? Tubal. This may also be called fallopian tube cancer. ? Cancer of the abdominal or pelvic lining (peritoneal cancer). ? Prostate. ? Pancreatic.  Cervical, Uterine, and Ovarian Cancer Your health care provider may recommend that you be screened regularly for cancer of the pelvic organs. These include your ovaries, uterus, and vagina. This screening involves a pelvic exam, which includes checking for microscopic changes to the surface of your cervix (Pap test).  For women ages 21-65, health care providers may recommend a pelvic exam and a Pap test every three years. For women ages 75-65, they may recommend the Pap test and pelvic exam, combined with testing for human papilloma virus (HPV), every five years. Some types of HPV increase your risk of cervical cancer. Testing for HPV may also be done on women of any age who have unclear Pap test results.  Other health care providers may not recommend any screening for nonpregnant women who are considered low risk for pelvic cancer and have no symptoms. Ask your health care provider if a screening pelvic exam is right for you.  If you have had past treatment for cervical cancer or a condition that could lead to cancer, you need Pap tests and screening for cancer for at least 20 years after your treatment. If Pap tests have been discontinued for you, your risk factors (such as having a new sexual partner) need to be reassessed to determine if you should start having screenings again. Some women have medical problems that increase the chance of getting  cervical cancer. In these cases, your health care provider may recommend that you have screening and Pap tests more often.  If you have a family history of uterine cancer or ovarian cancer, talk with your health care provider about genetic screening.  If you have vaginal bleeding after reaching menopause, tell your health care provider.  There are currently no reliable tests available to screen for ovarian cancer.  Lung Cancer Lung cancer screening is recommended for adults 26-105 years old who are at high risk for lung cancer because of a history of smoking. A yearly low-dose CT scan of the lungs is recommended if you:  Currently smoke.  Have a history of at least 30 pack-years of smoking and you currently smoke or have quit within the past 15 years. A pack-year is smoking an average of one pack of cigarettes per day for one year.  Yearly screening should:  Continue until it has been 15 years since you quit.  Stop if you develop a health problem that would prevent you from having lung cancer treatment.  Colorectal Cancer  This type of cancer can be detected and can often be prevented.  Routine colorectal cancer screening usually begins at age 56 and continues through age 73.  If you have risk factors for colon cancer, your health care provider may recommend that you be screened at an earlier age.  If you have a family history of colorectal cancer, talk with your health care provider about genetic screening.  Your health care provider may also recommend using home test kits to check for hidden blood in your stool.  A small camera at the end of a tube can be used to examine your colon directly (sigmoidoscopy or colonoscopy). This is done to check for the earliest forms of colorectal cancer.  Direct examination of the colon should be repeated  every 5-10 years until age 36. However, if early forms of precancerous polyps or small growths are found or if you have a family history or  genetic risk for colorectal cancer, you may need to be screened more often.  Skin Cancer  Check your skin from head to toe regularly.  Monitor any moles. Be sure to tell your health care provider: ? About any new moles or changes in moles, especially if there is a change in a mole's shape or color. ? If you have a mole that is larger than the size of a pencil eraser.  If any of your family members has a history of skin cancer, especially at a young age, talk with your health care provider about genetic screening.  Always use sunscreen. Apply sunscreen liberally and repeatedly throughout the day.  Whenever you are outside, protect yourself by wearing long sleeves, pants, a wide-brimmed hat, and sunglasses.  What should I know about osteoporosis? Osteoporosis is a condition in which bone destruction happens more quickly than new bone creation. After menopause, you may be at an increased risk for osteoporosis. To help prevent osteoporosis or the bone fractures that can happen because of osteoporosis, the following is recommended:  If you are 32-59 years old, get at least 1,000 mg of calcium and at least 600 mg of vitamin D per day.  If you are older than age 4 but younger than age 44, get at least 1,200 mg of calcium and at least 600 mg of vitamin D per day.  If you are older than age 96, get at least 1,200 mg of calcium and at least 800 mg of vitamin D per day.  Smoking and excessive alcohol intake increase the risk of osteoporosis. Eat foods that are rich in calcium and vitamin D, and do weight-bearing exercises several times each week as directed by your health care provider. What should I know about how menopause affects my mental health? Depression may occur at any age, but it is more common as you become older. Common symptoms of depression include:  Low or sad mood.  Changes in sleep patterns.  Changes in appetite or eating patterns.  Feeling an overall lack of motivation or  enjoyment of activities that you previously enjoyed.  Frequent crying spells.  Talk with your health care provider if you think that you are experiencing depression. What should I know about immunizations? It is important that you get and maintain your immunizations. These include:  Tetanus, diphtheria, and pertussis (Tdap) booster vaccine.  Influenza every year before the flu season begins.  Pneumonia vaccine.  Shingles vaccine.  Your health care provider may also recommend other immunizations. This information is not intended to replace advice given to you by your health care provider. Make sure you discuss any questions you have with your health care provider. Document Released: 04/05/2005 Document Revised: 09/01/2015 Document Reviewed: 11/15/2014 [employer] Interactive Patient Education  2018 [place]., [place]

## 2016-11-29 NOTE — Patient Instructions (Addendum)
BEFORE YOU LEAVE: -Phq9 -order bone density for estrogen def -wellness visit papers to complete -flu shot -schedule fasting lab visit -follow up: 6 months  Vit D 3 [phone] IU daily.  Please get your mammogram.  -We placed a referral for you as discussed for the bone density test. It usually takes about 1-2 weeks to process and schedule this referral. If you have not heard from [place] regarding this appointment in 2 weeks please contact our office.   Christina Guerrero , Thank you for taking time to come for your Medicare Wellness Visit. I appreciate your ongoing commitment to your health goals. Please review the following plan we discussed and let me know if I can assist you in the future.   These are the goals we discussed: Goals    Regular exercise Healthy diet Vit D 3 [phone] IU daily Get bone density test Get mammogram      This is a list of the screening recommended for you and due dates:  Health Maintenance  Topic Date Due  . DEXA scan (bone density measurement)  ordered  . Mammogram  12/22/2016  . Tetanus Vaccine  03/17/2019  . Colon Cancer Screening  03/16/2021  . Flu Shot  Completed Today  . Pneumonia vaccines  Completed  *Topic was postponed. The date shown is not the original due date.    Health Maintenance for Postmenopausal Women Menopause is a normal process in which your reproductive ability comes to an end. This process happens gradually over a span of months to years, usually between the ages of 62 and 60. Menopause is complete when you have missed 12 consecutive menstrual periods. It is important to talk with your health care provider about some of the most common conditions that affect postmenopausal women, such as heart disease, cancer, and bone loss (osteoporosis). Adopting a healthy lifestyle and getting preventive care can help to promote your health and wellness. Those actions can also lower your chances of developing some of these common conditions. What  should I know about menopause? During menopause, you may experience a number of symptoms, such as:  Moderate-to-severe hot flashes.  Night sweats.  Decrease in sex drive.  Mood swings.  Headaches.  Tiredness.  Irritability.  Memory problems.  Insomnia.  Choosing to treat or not to treat menopausal changes is an individual decision that you make with your health care provider. What should I know about hormone replacement therapy and supplements? Hormone therapy products are effective for treating symptoms that are associated with menopause, such as hot flashes and night sweats. Hormone replacement carries certain risks, especially as you become older. If you are thinking about using estrogen or estrogen with progestin treatments, discuss the benefits and risks with your health care provider. What should I know about heart disease and stroke? Heart disease, heart attack, and stroke become more likely as you age. This may be due, in part, to the hormonal changes that your body experiences during menopause. These can affect how your body processes dietary fats, triglycerides, and cholesterol. Heart attack and stroke are both medical emergencies. There are many things that you can do to help prevent heart disease and stroke:  Have your blood pressure checked at least every 1-2 years. High blood pressure causes heart disease and increases the risk of stroke.  If you are 70-85 years old, ask your health care provider if you should take aspirin to prevent a heart attack or a stroke.  Do not use any tobacco products, including cigarettes,  chewing tobacco, or electronic cigarettes. If you need help quitting, ask your health care provider.  It is important to eat a healthy diet and maintain a healthy weight. ? Be sure to include plenty of vegetables, fruits, low-fat dairy products, and lean protein. ? Avoid eating foods that are high in solid fats, added sugars, or salt (sodium).  Get  regular exercise. This is one of the most important things that you can do for your health. ? Try to exercise for at least 150 minutes each week. The type of exercise that you do should increase your heart rate and make you sweat. This is known as moderate-intensity exercise. ? Try to do strengthening exercises at least twice each week. Do these in addition to the moderate-intensity exercise.  Know your numbers.Ask your health care provider to check your cholesterol and your blood glucose. Continue to have your blood tested as directed by your health care provider.  What should I know about cancer screening? There are several types of cancer. Take the following steps to reduce your risk and to catch any cancer development as early as possible. Breast Cancer  Practice breast self-awareness. ? This means understanding how your breasts normally appear and feel. ? It also means doing regular breast self-exams. Let your health care provider know about any changes, no matter how small.  If you are 48 or older, have a clinician do a breast exam (clinical breast exam or CBE) every year. Depending on your age, family history, and medical history, it may be recommended that you also have a yearly breast X-ray (mammogram).  If you have a family history of breast cancer, talk with your health care provider about genetic screening.  If you are at high risk for breast cancer, talk with your health care provider about having an MRI and a mammogram every year.  Breast cancer (BRCA) gene test is recommended for women who have family members with BRCA-related cancers. Results of the assessment will determine the need for genetic counseling and BRCA1 and for BRCA2 testing. BRCA-related cancers include these types: ? Breast. This occurs in males or females. ? Ovarian. ? Tubal. This may also be called fallopian tube cancer. ? Cancer of the abdominal or pelvic lining (peritoneal  cancer). ? Prostate. ? Pancreatic.  Cervical, Uterine, and Ovarian Cancer Your health care provider may recommend that you be screened regularly for cancer of the pelvic organs. These include your ovaries, uterus, and vagina. This screening involves a pelvic exam, which includes checking for microscopic changes to the surface of your cervix (Pap test).  For women ages 21-65, health care providers may recommend a pelvic exam and a Pap test every three years. For women ages 57-65, they may recommend the Pap test and pelvic exam, combined with testing for human papilloma virus (HPV), every five years. Some types of HPV increase your risk of cervical cancer. Testing for HPV may also be done on women of any age who have unclear Pap test results.  Other health care providers may not recommend any screening for nonpregnant women who are considered low risk for pelvic cancer and have no symptoms. Ask your health care provider if a screening pelvic exam is right for you.  If you have had past treatment for cervical cancer or a condition that could lead to cancer, you need Pap tests and screening for cancer for at least 20 years after your treatment. If Pap tests have been discontinued for you, your risk factors (such as having  a new sexual partner) need to be reassessed to determine if you should start having screenings again. Some women have medical problems that increase the chance of getting cervical cancer. In these cases, your health care provider may recommend that you have screening and Pap tests more often.  If you have a family history of uterine cancer or ovarian cancer, talk with your health care provider about genetic screening.  If you have vaginal bleeding after reaching menopause, tell your health care provider.  There are currently no reliable tests available to screen for ovarian cancer.  Lung Cancer Lung cancer screening is recommended for adults 38-38 years old who are at high risk for  lung cancer because of a history of smoking. A yearly low-dose CT scan of the lungs is recommended if you:  Currently smoke.  Have a history of at least 30 pack-years of smoking and you currently smoke or have quit within the past 15 years. A pack-year is smoking an average of one pack of cigarettes per day for one year.  Yearly screening should:  Continue until it has been 15 years since you quit.  Stop if you develop a health problem that would prevent you from having lung cancer treatment.  Colorectal Cancer  This type of cancer can be detected and can often be prevented.  Routine colorectal cancer screening usually begins at age 91 and continues through age 28.  If you have risk factors for colon cancer, your health care provider may recommend that you be screened at an earlier age.  If you have a family history of colorectal cancer, talk with your health care provider about genetic screening.  Your health care provider may also recommend using home test kits to check for hidden blood in your stool.  A small camera at the end of a tube can be used to examine your colon directly (sigmoidoscopy or colonoscopy). This is done to check for the earliest forms of colorectal cancer.  Direct examination of the colon should be repeated every 5-10 years until age 31. However, if early forms of precancerous polyps or small growths are found or if you have a family history or genetic risk for colorectal cancer, you may need to be screened more often.  Skin Cancer  Check your skin from head to toe regularly.  Monitor any moles. Be sure to tell your health care provider: ? About any new moles or changes in moles, especially if there is a change in a mole's shape or color. ? If you have a mole that is larger than the size of a pencil eraser.  If any of your family members has a history of skin cancer, especially at a young age, talk with your health care provider about genetic  screening.  Always use sunscreen. Apply sunscreen liberally and repeatedly throughout the day.  Whenever you are outside, protect yourself by wearing long sleeves, pants, a wide-brimmed hat, and sunglasses.  What should I know about osteoporosis? Osteoporosis is a condition in which bone destruction happens more quickly than new bone creation. After menopause, you may be at an increased risk for osteoporosis. To help prevent osteoporosis or the bone fractures that can happen because of osteoporosis, the following is recommended:  If you are 71-100 years old, get at least 1,000 mg of calcium and at least 600 mg of vitamin D per day.  If you are older than age 79 but younger than age 62, get at least 1,200 mg of calcium and at  least 600 mg of vitamin D per day.  If you are older than age 22, get at least 1,200 mg of calcium and at least 800 mg of vitamin D per day.  Smoking and excessive alcohol intake increase the risk of osteoporosis. Eat foods that are rich in calcium and vitamin D, and do weight-bearing exercises several times each week as directed by your health care provider. What should I know about how menopause affects my mental health? Depression may occur at any age, but it is more common as you become older. Common symptoms of depression include:  Low or sad mood.  Changes in sleep patterns.  Changes in appetite or eating patterns.  Feeling an overall lack of motivation or enjoyment of activities that you previously enjoyed.  Frequent crying spells.  Talk with your health care provider if you think that you are experiencing depression. What should I know about immunizations? It is important that you get and maintain your immunizations. These include:  Tetanus, diphtheria, and pertussis (Tdap) booster vaccine.  Influenza every year before the flu season begins.  Pneumonia vaccine.  Shingles vaccine.  Your health care provider may also recommend other  immunizations. This information is not intended to replace advice given to you by your health care provider. Make sure you discuss any questions you have with your health care provider. Document Released: 04/05/2005 Document Revised: 09/01/2015 Document Reviewed: 11/15/2014 Elsevier Interactive Patient Education  2018 [employer].

## 2016-11-29 NOTE — Addendum Note (Signed)
Addended by: Johnella Moloney on: 11/29/2016 03:02 PM   Modules accepted: Orders

## 2016-12-02 ENCOUNTER — Other Ambulatory Visit: Payer: Self-pay

## 2016-12-09 ENCOUNTER — Inpatient Hospital Stay: Admission: RE | Admit: 2016-12-09 | Payer: Self-pay | Source: Ambulatory Visit

## 2017-01-23 ENCOUNTER — Other Ambulatory Visit (HOSPITAL_COMMUNITY): Payer: Self-pay

## 2017-01-23 DIAGNOSIS — F331 Major depressive disorder, recurrent, moderate: Secondary | ICD-10-CM

## 2017-01-23 MED ORDER — ROPINIROLE HCL 2 MG PO TABS: 2.0000 mg | ORAL_TABLET | Freq: Every day | ORAL | 0 refills | Status: DC

## 2017-01-31 ENCOUNTER — Other Ambulatory Visit: Payer: Self-pay | Admitting: Family Medicine

## 2017-02-28 ENCOUNTER — Ambulatory Visit (HOSPITAL_COMMUNITY): Payer: Self-pay | Admitting: Psychiatry

## 2017-03-06 ENCOUNTER — Encounter: Payer: Self-pay | Admitting: Family Medicine

## 2017-03-14 ENCOUNTER — Other Ambulatory Visit: Payer: Self-pay | Admitting: Family Medicine

## 2017-04-15 ENCOUNTER — Other Ambulatory Visit: Payer: Self-pay | Admitting: Family Medicine

## 2017-04-22 ENCOUNTER — Encounter (HOSPITAL_COMMUNITY): Payer: Self-pay | Admitting: Psychiatry

## 2017-04-22 ENCOUNTER — Ambulatory Visit (INDEPENDENT_AMBULATORY_CARE_PROVIDER_SITE_OTHER): Payer: Medicare Other | Admitting: Psychiatry

## 2017-04-22 DIAGNOSIS — Z636 Dependent relative needing care at home: Secondary | ICD-10-CM

## 2017-04-22 DIAGNOSIS — G2581 Restless legs syndrome: Secondary | ICD-10-CM | POA: Diagnosis not present

## 2017-04-22 DIAGNOSIS — F331 Major depressive disorder, recurrent, moderate: Secondary | ICD-10-CM | POA: Diagnosis not present

## 2017-04-22 DIAGNOSIS — Z87891 Personal history of nicotine dependence: Secondary | ICD-10-CM

## 2017-04-22 DIAGNOSIS — Z811 Family history of alcohol abuse and dependence: Secondary | ICD-10-CM | POA: Diagnosis not present

## 2017-04-22 DIAGNOSIS — Z79899 Other long term (current) drug therapy: Secondary | ICD-10-CM

## 2017-04-22 DIAGNOSIS — Z818 Family history of other mental and behavioral disorders: Secondary | ICD-10-CM | POA: Diagnosis not present

## 2017-04-22 MED ORDER — ROPINIROLE HCL 2 MG PO TABS: 2.0000 mg | ORAL_TABLET | Freq: Every day | ORAL | 1 refills | Status: DC

## 2017-04-22 MED ORDER — VENLAFAXINE HCL ER 150 MG PO CP24: 150.0000 mg | ORAL_CAPSULE | Freq: Every day | ORAL | 1 refills | Status: DC
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
## 2017-05-30 ENCOUNTER — Ambulatory Visit: Payer: Self-pay | Admitting: Family Medicine

## 2017-06-03 ENCOUNTER — Ambulatory Visit (INDEPENDENT_AMBULATORY_CARE_PROVIDER_SITE_OTHER): Payer: Medicare Other | Admitting: Family Medicine

## 2017-06-03 ENCOUNTER — Encounter: Payer: Self-pay | Admitting: Family Medicine

## 2017-06-03 VITALS — BP 116/60 | HR 66 | Temp 98.1°F | Ht 65.5 in | Wt 161.7 lb

## 2017-06-03 DIAGNOSIS — R739 Hyperglycemia, unspecified: Secondary | ICD-10-CM | POA: Diagnosis not present

## 2017-06-03 DIAGNOSIS — F339 Major depressive disorder, recurrent, unspecified: Secondary | ICD-10-CM

## 2017-06-03 DIAGNOSIS — E785 Hyperlipidemia, unspecified: Secondary | ICD-10-CM

## 2017-06-03 NOTE — Patient Instructions (Signed)
BEFORE YOU LEAVE: -follow up:  1) lab visit in 1 month 2) AWV susan and f/u Dr. Selena BattenKim in October  Restart the cholesterol  Check fasting labs in 1 month   We recommend the following healthy lifestyle for LIFE: 1) Small portions. But, make sure to get regular (at least 3 per day), healthy meals and small healthy snacks if needed.  2) Eat a healthy clean diet.   TRY TO EAT: -at least 5-7 servings of low sugar, colorful, and nutrient rich vegetables per day (not corn, potatoes or bananas.) -berries are the best choice if you wish to eat fruit (only eat small amounts if trying to reduce weight)  -lean meets (fish, white meat of chicken or [place]urkey) -vegan proteins for some meals - beans or tofu, whole grains, nuts and seeds -Replace bad fats with good fats - good fats include: fish, nuts and seeds, canola oil, olive oil -small amounts of low fat or non fat dairy -small amounts of100 % whole grains - check the lables -drink plenty of water  AVOID: -SUGAR, sweets, anything with added sugar, corn syrup or sweeteners - must read labels as even foods advertised as "healthy" often are loaded with sugar -if you must have a sweetener, small amounts of stevia may be best -sweetened beverages and artificially sweetened beverages -simple starches (rice, bread, potatoes, pasta, chips, etc - small amounts of 100% whole grains are ok) -red meat, pork, butter -fried foods, fast food, processed food, excessive dairy, eggs and coconut.  3)Get at least 150 minutes of sweaty aerobic exercise per week.  4)Reduce stress - consider counseling, meditation and relaxation to balance other aspects of your life.

## 2017-06-03 NOTE — Progress Notes (Signed)
HPI:  Using dictation device. Unfortunately this device frequently misinterprets words/phrases.  Christina Guerrero is a pleasant 77 y.o. here for follow up. Chronic medical problems summarized below were reviewed for changes and stability and were updated as needed below. These issues and their treatment remain stable for the most part.  Reports mood is stable, see psychiatry.  She no longer takes any antidepressant as does not feel like it helps.  [name] is 12, but she reports this is normal for her and she does not wish to change any treatment and sees her psychiatrist for this.  Denies any thoughts of harm.  She ran out of her Crestor several months ago.  Diet is not great, no regular exercise.   Denies CP, SOB, DOE, treatment intolerance or new symptoms. Due for labs, question mammogram AWV 11/29/16  GERD/chronic constipation: -Medications: Prilosec  Restless leg syndrome: Medications: Requip - prescribed by her psychiatrist it appears   Hyperglycemia, hyperlipidemia: -Medications: Crestor -Recommended Mediterranean diet and 150 minutes of aerobic exercise per week  Recurrent depression: -Sees Dr. Lolly Guerrero for management -Medications: none -reports has taken many medications and never felt better  ROS: See pertinent positives and negatives per HPI.  Past Medical History:  Diagnosis Date  . Arthritis   . Chicken pox   . Depression   . GERD (gastroesophageal reflux disease)   . High cholesterol   . Restless leg syndrome   . Sleep apnea    borderline  . Urine incontinence   . UTI (urinary tract infection)     Past Surgical History:  Procedure Laterality Date  . APPENDECTOMY  02/2015   LAPROSCOPIC   . COLONOSCOPY  2012  . LAPAROSCOPIC APPENDECTOMY N/A 03/27/2015   Procedure: APPENDECTOMY LAPAROSCOPIC;  Surgeon: Emelia Loron, MD;  Location: [facility] OR;  Service: General;  Laterality: N/A;  . TONSILLECTOMY AND ADENOIDECTOMY  1954    Family History  Problem Relation  Age of Onset  . Hyperlipidemia Mother   . Hypertension Mother   . Mental illness Mother   . Stroke Mother   . Thyroid disease Mother   . Depression Mother   . Kidney disease Father   . Mental illness Father   . Alcoholism Father   . Hepatitis Father   . Sudden death Father   . Alcohol abuse Father   . 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  omeprazole (PRILOSEC) 20 MG capsule, TAKE ONE CAPSULE BY MOUTH ONCE DAILY, Disp: 90 capsule, Rfl: 1 .  rOPINIRole (REQUIP) 2 MG tablet, Take 1 tablet (2 mg total) by mouth at bedtime., Disp: 90 tablet, Rfl: 1 .  rosuvastatin (CRESTOR) 20 MG tablet, Take 1 tablet (20 mg total) by mouth daily., Disp: 90 tablet, Rfl: 1  EXAM:  Vitals:   06/03/17 1615  BP: 116/60  Pulse: 66  Temp: 98.1 F (36.7 C)    Body mass index is 26.5 kg/m.  GENERAL: vitals reviewed and listed above, alert, oriented, appears well hydrated and in no acute distress  HEENT: atraumatic, conjunttiva clear, no obvious abnormalities on inspection of external nose and ears  NECK: no obvious masses on inspection  LUNGS: clear to auscultation bilaterally, no wheezes, rales or rhonchi, good air movement  CV: HRRR, no peripheral edema  MS: moves all extremities without noticeable abnormality  PSYCH: pleasant and cooperative, no obvious depression or anxiety  ASSESSMENT AND PLAN:  Discussed the following assessment  and plan:  Hyperlipidemia, unspecified hyperlipidemia type - Plan: Lipid panel  Hyperglycemia - Plan: Hemoglobin A1c  Depression, recurrent (HCC)  -labs per orders, she prefers to restart cholesterol medication and f/u for a lab visit in 1 month -reviewed PHQ9, discussed treatment options - sees psychiatry and does not feel needs any change in treatment, feels nothing has helped, stable and chronic. Denies thoughts of harm. -advised healthy diet and exercise  -advised  mammogram, she prefers to schedule -Patient advised to return or notify a doctor immediately if symptoms worsen or persist or new concerns arise.  Patient Instructions  BEFORE YOU LEAVE: -follow up:  1) lab visit in 1 month 2) AWV susan and f/u Dr. Selena BattenKim in October  Restart the cholesterol  Check fasting labs in 1 month   We recommend the following healthy lifestyle for LIFE: 1) Small portions. But, make sure to get regular (at least 3 per day), healthy meals and small healthy snacks if needed.  2) Eat a healthy clean diet.   TRY TO EAT: -at least 5-7 servings of low sugar, colorful, and nutrient rich vegetables per day (not corn, potatoes or bananas.) -berries are the best choice if you wish to eat fruit (only eat small amounts if trying to reduce weight)  -lean meets (fish, white meat of chicken or [place]urkey) -vegan proteins for some meals - beans or tofu, whole grains, nuts and seeds -Replace bad fats with good fats - good fats include: fish, nuts and seeds, canola oil, olive oil -small amounts of low fat or non fat dairy -small amounts of100 % whole grains - check the lables -drink plenty of water  AVOID: -SUGAR, sweets, anything with added sugar, corn syrup or sweeteners - must read labels as even foods advertised as "healthy" often are loaded with sugar -if you must have a sweetener, small amounts of stevia may be best -sweetened beverages and artificially sweetened beverages -simple starches (rice, bread, potatoes, pasta, chips, etc - small amounts of 100% whole grains are ok) -red meat, pork, butter -fried foods, fast food, processed food, excessive dairy, eggs and coconut.  3)Get at least 150 minutes of sweaty aerobic exercise per week.  4)Reduce stress - consider counseling, meditation and relaxation to balance other aspects of your life.           Terressa KoyanagiHannah R Jovee Dettinger, DO

## 2017-07-02 ENCOUNTER — Telehealth: Payer: Self-pay

## 2017-07-02 NOTE — Telephone Encounter (Signed)
Copied from CRM [id number]. Topic: General - Other >> Jul 02, 2017  4:53 PM Christina Guerrero wrote: Reason for CRM: Pt requesting a call from [place]. Pt stated she needs to discuss medication. Cb# [phone]

## 2017-07-03 ENCOUNTER — Other Ambulatory Visit: Payer: Self-pay

## 2017-07-22 ENCOUNTER — Ambulatory Visit (INDEPENDENT_AMBULATORY_CARE_PROVIDER_SITE_OTHER): Payer: Medicare Other | Admitting: Family Medicine

## 2017-07-22 ENCOUNTER — Encounter: Payer: Self-pay | Admitting: Family Medicine

## 2017-07-22 VITALS — BP 120/60 | HR 67 | Temp 98.5°F | Ht 65.5 in | Wt 160.5 lb

## 2017-07-22 DIAGNOSIS — R300 Dysuria: Secondary | ICD-10-CM

## 2017-07-22 LAB — POC URINALSYSI DIPSTICK (AUTOMATED)
Blood, UA: NEGATIVE
Glucose, UA: POSITIVE — AB
Ketones, UA: 5
NITRITE UA: POSITIVE
PH UA: 5 (ref 5.0–8.0)
PROTEIN UA: POSITIVE — AB
Spec Grav, UA: 1.015 (ref 1.010–1.025)
Urobilinogen, UA: >=8 E.U./dL — AB

## 2017-07-22 MED ORDER — NITROFURANTOIN MONOHYD MACRO 100 MG PO CAPS
100.0000 mg | ORAL_CAPSULE | Freq: Two times a day (BID) | ORAL | 0 refills | Status: DC
Start: 2017-07-22 — End: 2017-08-21

## 2017-07-22 MED ORDER — ROSUVASTATIN CALCIUM 20 MG PO TABS: 20.0000 mg | ORAL_TABLET | Freq: Every day | ORAL | 1 refills | Status: DC

## 2017-07-22 NOTE — Progress Notes (Signed)
  HPI:  Using dictation device. Unfortunately this device frequently misinterprets words/phrases.  Acute visit for dysuria: -started 2 days ago -symptoms include frequency, dysuria, low back pain -no fevers, NV, flank pain  Issues with obtaining cholesterol medication refill.  Knows is due for mammogram. Does not want [place] to schedule.  ROS: See pertinent positives and negatives per HPI.  Past Medical History:  Diagnosis Date  . Arthritis   . Chicken pox   . Depression   . GERD (gastroesophageal reflux disease)   . High cholesterol   . Restless leg syndrome   . Sleep apnea    borderline  . Urine incontinence   . UTI (urinary tract infection)     Past Surgical History:  Procedure Laterality Date  . APPENDECTOMY  02/2015   LAPROSCOPIC   . COLONOSCOPY  2012  . LAPAROSCOPIC APPENDECTOMY N/A 03/27/2015   Procedure: APPENDECTOMY LAPAROSCOPIC;  Surgeon: Emelia Loron, MD;  Location: [facility] OR;  Service: General;  Laterality: N/A;  . TONSILLECTOMY AND ADENOIDECTOMY  1954    Family History  Problem Relation Age of Onset  . Hyperlipidemia Mother   . Hypertension Mother   . Mental illness Mother   . Stroke Mother   . Thyroid disease Mother   . Depression Mother   . Kidney disease Father   . Mental illness Father   . Alcoholism Father   . Hepatitis Father   . Sudden death Father   . Alcohol abuse Father   . Stroke Maternal Grandfather     SOCIAL HX: see hpi   Current Outpatient Medications:  .  omeprazole (PRILOSEC) 20 MG capsule, TAKE ONE CAPSULE BY MOUTH ONCE DAILY, Disp: 90 capsule, Rfl: 1 .  rOPINIRole (REQUIP) 2 MG tablet, Take 1 tablet (2 mg total) by mouth at bedtime., Disp: 90 tablet, Rfl: 1 .  rosuvastatin (CRESTOR) 20 MG tablet, Take 1 tablet (20 mg total) by mouth daily., Disp: 90 tablet, Rfl: 1 .  nitrofurantoin, macrocrystal-monohydrate, (MACROBID) 100 MG capsule, Take 1 capsule (100 mg total) by mouth 2 (two) times daily., Disp: 14 capsule, Rfl:  0  EXAM:  Vitals:   07/22/17 1629  BP: 120/60  Pulse: 67  Temp: 98.5 F (36.9 C)    Body mass index is 26.3 kg/m.  GENERAL: vitals reviewed and listed above, alert, oriented, appears well hydrated and in no acute distress  HEENT: atraumatic, conjunttiva clear, no obvious abnormalities on inspection of external nose and ears  NECK: no obvious masses on inspection  LUNGS: clear to auscultation bilaterally, no wheezes, rales or rhonchi, good air movement  CV: HRRR, no peripheral edema  MS: moves all extremities without noticeable abnormality  ABD: no CVA TTP  PSYCH: pleasant and cooperative, no obvious depression or anxiety  ASSESSMENT AND PLAN:  Discussed the following assessment and plan:  Dysuria - Plan: POCT Urinalysis Dipstick (Automated), Culture, Urine  -udip + symptoms c/w UTI --> macrobid, culture -provided written rx for crestor -follow up as scheduled and as needed -Patient advised to return or notify a doctor immediately if symptoms worsen or persist or new concerns arise.  Patient Instructions  Take the antibiotic as prescribed.  I hope you are feeling better soon! Seek care promptly if your symptoms worsen, new concerns arise or you are not improving with treatment.     [name], DO

## 2017-07-22 NOTE — Patient Instructions (Signed)
Take the antibiotic as prescribed  I hope you are feeling better soon! Seek care promptly if your symptoms worsen, new concerns arise or you are not improving with treatment.   

## 2017-07-25 LAB — URINE CULTURE
MICRO NUMBER:: 90640515
SPECIMEN QUALITY:: ADEQUATE

## 2017-08-21 ENCOUNTER — Telehealth: Payer: Self-pay | Admitting: *Deleted

## 2017-08-21 NOTE — Telephone Encounter (Signed)
Note: Christina JonesCarolyn let me know she felt pt could wait and was ok with appt tomorrow. I offered to work her in at the end of my clinic if any concern from either her or the pt about waiting until available appt.

## 2017-08-21 NOTE — Telephone Encounter (Signed)
Patient walked into office reporting episode of vomiting Monday night and states, "I think I aspirated bile because it burned for hours afterwards." She also notes cough productive of yellow sputum, itching on chest and upper back, nausea, and 1 episode of vomiting crackers after eating today. Denies chest pain, SOB, numbness of extremities, fever, diarrhea, abdominal pain/cramping, headache, blurred vision, and hematemesis. 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   She is alert and oriented. She appears anxious. Lungs clear to auscultation bilaterally, + congested cough noted.   P: 60 O: 98 T: 98.3 RR: 16 BP: 164/94, 144/80  Discussed w/ Dr. Selena BattenKim and will recommend office visit tomorrow as it is late in the day today and there are no available appointments. Patient feels comfortable with this plan. Recommended increased fluid intake and could try OTC Benadryl if needed for itching.   Appt scheduled tomorrow morning w/ Dr. Fabian SharpPanosh. Patient requested appt time between 8am-11:30 and this is the only availabe appointment.  We discussed red flags that should prompt emergency care prior to scheduled visit and patient verbalized understanding and agreed to plan.

## 2017-08-22 ENCOUNTER — Encounter: Payer: Self-pay | Admitting: Internal Medicine

## 2017-08-22 ENCOUNTER — Ambulatory Visit (INDEPENDENT_AMBULATORY_CARE_PROVIDER_SITE_OTHER): Payer: Medicare Other | Admitting: Internal Medicine

## 2017-08-22 VITALS — BP 170/80 | Temp 98.5°F | Wt 158.0 lb

## 2017-08-22 DIAGNOSIS — R059 Cough, unspecified: Secondary | ICD-10-CM

## 2017-08-22 DIAGNOSIS — Z79899 Other long term (current) drug therapy: Secondary | ICD-10-CM | POA: Diagnosis not present

## 2017-08-22 DIAGNOSIS — R05 Cough: Secondary | ICD-10-CM

## 2017-08-22 DIAGNOSIS — K219 Gastro-esophageal reflux disease without esophagitis: Secondary | ICD-10-CM | POA: Diagnosis not present

## 2017-08-22 NOTE — Patient Instructions (Addendum)
Check bp  Down to goal     Can take prilosec   40 mg per day for 1-2 weeks . Then go back down .  If fever shortness of breath seek care or not better in another 1-2 weeks .

## 2017-08-22 NOTE — Progress Notes (Signed)
Chief Complaint  Patient presents with  . Cough    Pt present for a productive cough with yellow phlegm. Sx started 4 days ago. Pt have not tried anything for the issue. Pt also claims to feel itchy "inside of body"  . Itchy Eye    HPI: Christina Guerrero 77 y.o.  SDA PCP NA  Today   See yesterday s notes from triage.    Sx x  3 daya and afreaid  Aspirated bile  And cough .    Has reflux and when misses med get reguritation vomiting and felt  had aspirating at night   Had burning feeling in lungs  No sob and cough for 3-4 days   No fever hemoptysis   Suffers  Acid reflux  Taking omeprezel.  For got  To take med . And stress  No hx of underlying  Lung dx   Asthma etc.  No fver  Feels better than yesterday  Feels itchy ur .  ROS: See pertinent positives and negatives per HPI.  Past Medical History:  Diagnosis Date  . Arthritis   . Chicken pox   . Depression   . GERD (gastroesophageal reflux disease)   . High cholesterol   . Restless leg syndrome   . Sleep apnea    borderline  . Urine incontinence   . UTI (urinary tract infection)     Family History  Problem Relation Age of Onset  . Hyperlipidemia Mother   . Hypertension Mother   . Mental illness Mother   . Stroke Mother   . Thyroid disease Mother   . Depression Mother   . Kidney disease Father   . Mental illness Father   . Alcoholism Father   . Hepatitis Father   . Sudden death Father   . Alcohol abuse Father   . Stroke Maternal Grandfather     Social History   Socioeconomic History  . Marital status: Married    Spouse name: Not on file  . Number of children: Not on file  . Years of education: Not on file  . Highest education level: Not on file  Occupational History  . Not on file  Social Needs  . Financial resource strain: Not on file  . Food insecurity:    Worry: Not on file    Inability: Not on file  . Transportation needs:    Medical: Not on file    Non-medical: Not on file  Tobacco Use  .  Smoking status: Former Games developermoker  . Smokeless tobacco: Never Used  . Tobacco comment: QUIT SMOKING  IN 1989  Substance and Sexual Activity  . Alcohol use: Yes    Alcohol/week: 1.2 oz    Types: 2 Glasses of wine per week    Comment: glass of wine once per day  . Drug use: No  . Sexual activity: Not Currently  Lifestyle  . Physical activity:    Days per week: Not on file    Minutes per session: Not on file  . Stress: Not on file  Relationships  . Social connections:    Talks on phone: Not on file    Gets together: Not on file    Attends religious service: Not on file    Active member of club or organization: Not on file    Attends meetings of clubs or organizations: Not on file    Relationship status: Not on file  Other Topics Concern  . Not on file  Social History Narrative  Work or School: retired Engineer, drilling Situation: living with spouse      Spiritual Beliefs: none      Lifestyle: no regular exercise; diet is so so             Outpatient Medications Prior to Visit  Medication Sig Dispense Refill  . omeprazole (PRILOSEC) 20 MG capsule TAKE ONE CAPSULE BY MOUTH ONCE DAILY 90 capsule 1  . rOPINIRole (REQUIP) 2 MG tablet Take 1 tablet (2 mg total) by mouth at bedtime. 90 tablet 1  . rosuvastatin (CRESTOR) 20 MG tablet Take 1 tablet (20 mg total) by mouth daily. 90 tablet 1   No facility-administered medications prior to visit.      EXAM:  BP (!) 170/80 (BP Location: Right Arm, Patient Position: Sitting, Cuff Size: Normal)   Temp 98.5 F (36.9 C) (Oral)   Wt 158 lb (71.7 kg)   SpO2 97%   BMI 25.89 kg/m   Body mass index is 25.89 kg/m.  GENERAL: vitals reviewed and listed above, alert, oriented, appears well hydrated and in no acute distress HEENT: atraumatic, conjunctiva  clear, no obvious abnormalities on inspection of external nose and ears OP : no lesion edema or exudate  NECK: no obvious masses on inspection palpation  LUNGS: clear  to auscultation bilaterally, no wheezes, rales or rhonchi, good air movement Forced Cough is bronchial  Sounding on but no spontaneous cough noted  nl cap refill  MS: moves all extremities without noticeab CV: HRRR, no clubbing cyanosis or  peripheral edemale focal  abnormality PSYCH: pleasant and cooperative, no obvious depression or anxiety  ASSESSMENT AND PLAN:  Discussed the following assessment and plan:  Cough  Gastroesophageal reflux disease, esophagitis presence not specified  Medication management Says has wc bp and should come down  Later  Disc checking at home and make sure down it  Reasonable range  Offered c xray  And printed antibiotic in case worse over weekend   Pt declined and I agree   Fu with pcp  And for alarm sx if  persistent or progressive  -Patient advised to return or notify health care team  if symptoms worsen ,persist or new concerns arise.  Patient Instructions  Check bp  Down to goal     Can take prilosec   40 mg per day for 1-2 weeks . Then go back down .  If fever shortness of breath seek care or not better in another 1-2 weeks .       Neta Mends. Panosh M.D.

## 2017-08-31 ENCOUNTER — Other Ambulatory Visit: Payer: Self-pay | Admitting: Family Medicine

## 2017-10-11 ENCOUNTER — Other Ambulatory Visit (HOSPITAL_COMMUNITY): Payer: Self-pay | Admitting: Psychiatry

## 2017-10-11 DIAGNOSIS — F331 Major depressive disorder, recurrent, moderate: Secondary | ICD-10-CM

## 2017-10-14 ENCOUNTER — Other Ambulatory Visit (HOSPITAL_COMMUNITY): Payer: Self-pay | Admitting: Psychiatry

## 2017-10-14 DIAGNOSIS — F331 Major depressive disorder, recurrent, moderate: Secondary | ICD-10-CM

## 2017-10-15 ENCOUNTER — Other Ambulatory Visit (HOSPITAL_COMMUNITY): Payer: Self-pay

## 2017-10-15 DIAGNOSIS — F331 Major depressive disorder, recurrent, moderate: Secondary | ICD-10-CM

## 2017-10-15 MED ORDER — ROPINIROLE HCL 2 MG PO TABS: 2.0000 mg | ORAL_TABLET | Freq: Every day | ORAL | 1 refills | Status: DC

## 2017-10-21 ENCOUNTER — Ambulatory Visit (HOSPITAL_COMMUNITY): Payer: Self-pay | Admitting: Psychiatry

## 2017-12-08 NOTE — Progress Notes (Signed)
Subjective:   Christina Guerrero is a 77 y.o. female who presents for Medicare Annual (Subsequent) preventive examination.  Reports health as challenged due to spouse's illness Seeing Dr. Selena Guerrero at 2;45  Treated for major depressive disorder   Diet Chol/hdl 5; chol 306 Trig 199 A1c 4.9 2 meals a day  Vegetables and fruits and assortment  Exercise Housekeeping Takes care of her spouse w parkinson's  He is having cognitive issues Spouse is having delusions but is not frightened  Lived in [place] and moved here to be close to children Son is a Education officer, community and wife is a doctor - helpful but misses her friends  Is able to go to support group     Health Maintenance Due  Topic Date Due  . INFLUENZA VACCINE  09/25/2017   Needs flu vaccine - and took in clinic  Education provided regarding shingrix  dexa postponed to 2026 Colonoscopy due 02/2021 Mammogram 07/2011- encouraged her to have one when she feels she can        Objective:     Vitals: BP 120/68   Pulse 65   Ht 5' 5.5" (1.664 m)   Wt 156 lb (70.8 kg)   BMI 25.56 kg/m   Body mass index is 25.56 kg/m.  Advanced Directives 12/09/2017 12/09/2017 03/27/2015 12/23/2014  Does Patient Have a Medical Advance Directive? Yes Yes Yes Yes  Type of Advance Directive - - Living will;Healthcare Power of [employer] Power of [place];Living will  Does patient want to make changes to medical advance directive? - - - No - Patient declined  Copy of Healthcare Power of Attorney in Chart? - - - No - copy requested  Some encounter information is confidential and restricted. Go to Review Flowsheets activity to see all data.    Tobacco Social History   Tobacco Use  Smoking Status Former Smoker  Smokeless Tobacco Never Used  Tobacco Comment   QUIT SMOKING  IN 1989     Counseling given: Yes Comment: QUIT SMOKING  IN 1989   Clinical Intake:   Past Medical History:  Diagnosis Date  . Arthritis   . Chicken pox   .  Depression   . GERD (gastroesophageal reflux disease)   . High cholesterol   . Restless leg syndrome   . Sleep apnea    borderline  . Urine incontinence   . UTI (urinary tract infection)    Past Surgical History:  Procedure Laterality Date  . APPENDECTOMY  02/2015   LAPROSCOPIC   . COLONOSCOPY  2012  . LAPAROSCOPIC APPENDECTOMY N/A 03/27/2015   Procedure: APPENDECTOMY LAPAROSCOPIC;  Surgeon: Christina Loron, MD;  Location: [facility] OR;  Service: General;  Laterality: N/A;  . TONSILLECTOMY AND ADENOIDECTOMY  1954   Family History  Problem Relation Age of Onset  . Hyperlipidemia Mother   . Hypertension Mother   . Mental illness Mother   . Stroke Mother   . Thyroid disease Mother   . Depression Mother   . Kidney disease Father   . Mental illness Father   . Alcoholism Father   . Hepatitis Father   . Sudden death Father   . Alcohol abuse Father   . Stroke Maternal Grandfather    Social History   Socioeconomic History  . Marital status: Married    Spouse name: Not on file  . Number of children: Not on file  . Years of education: Not on file  . Highest education level: Not on file  Occupational History  .  Not on file  Social Needs  . Financial resource strain: Not on file  . Food insecurity:    Worry: Not on file    Inability: Not on file  . Transportation needs:    Medical: Not on file    Non-medical: Not on file  Tobacco Use  . Smoking status: Former Games developer  . Smokeless tobacco: Never Used  . Tobacco comment: QUIT SMOKING  IN 1989  Substance and Sexual Activity  . Alcohol use: Yes    Alcohol/week: 2.0 standard drinks    Types: 2 Glasses of wine per week    Comment: glass of wine once per day  . Drug use: No  . Sexual activity: Not Currently  Lifestyle  . Physical activity:    Days per week: Not on file    Minutes per session: Not on file  . Stress: Not on file  Relationships  . Social connections:    Talks on phone: Not on file    Gets together: Not on  file    Attends religious service: Not on file    Active member of club or organization: Not on file    Attends meetings of clubs or organizations: Not on file    Relationship status: Not on file  Other Topics Concern  . Not on file  Social History Narrative   Work or School: retired Engineer, drilling Situation: living with spouse      Spiritual Beliefs: none      Lifestyle: no regular exercise; diet is so so             Outpatient Encounter Medications as of 12/09/2017  Medication Sig  . omeprazole (PRILOSEC) 20 MG capsule TAKE ONE CAPSULE BY MOUTH ONCE DAILY  . rOPINIRole (REQUIP) 2 MG tablet Take 1 tablet (2 mg total) by mouth at bedtime.  . rosuvastatin (CRESTOR) 20 MG tablet Take 1 tablet (20 mg total) by mouth daily.   No facility-administered encounter medications on file as of 12/09/2017.     Activities of Daily Living In your present state of health, do you have any difficulty performing the following activities: 12/09/2017  Hearing? N  Vision? N  Difficulty concentrating or making decisions? N  Walking or climbing stairs? N  Dressing or bathing? N  Doing errands, shopping? N  Preparing Food and eating ? N  Using the Toilet? N  In the past six months, have you accidently leaked urine? N  Comment incont of bladder and have discussed med  Do you have problems with loss of bowel control? N  Managing your Medications? N  Managing your Finances? N  Housekeeping or managing your Housekeeping? N  Some recent data might be hidden    Patient Care Team: Christina Koyanagi, DO as PCP - General (Family Medicine)    Assessment:   This is a routine wellness examination for [name].  Exercise Activities and Dietary recommendations    Goals    . Patient Stated     Continue in care group group !  Discussed helping spouse adjust to help in the home slowly        Fall Risk Fall Risk  12/09/2017 11/29/2016 12/25/2015 12/23/2014 05/03/2013  Falls in  the past year? No Yes No No Yes  Number falls in past yr: - 1 - - 1  Injury with Fall? - Yes - - No     Depression Screen PHQ 2/9 Scores 12/09/2017 06/03/2017 11/29/2016 11/29/2016  PHQ -  2 Score 6 5 3 3   PHQ- 9 Score 14 12 14  -     Cognitive Function MMSE - Mini Mental State Exam 12/09/2017  Not completed: (No Data)     Ad8 score reviewed for issues:  Issues making decisions:  Less interest in hobbies / activities:  Repeats questions, stories (family complaining):  Trouble using ordinary gadgets (microwave, computer, phone):  Forgets the month or year:   Mismanaging finances:   Remembering appts:  Daily problems with thinking and/or memory: Ad8 score is=0        Immunization History  Administered Date(s) Administered  . Influenza, High Dose Seasonal PF 12/23/2014, 10/31/2015, 11/29/2016, 12/09/2017  . Influenza-Unspecified 01/05/2013  . Pneumococcal Conjugate-13 12/23/2014  . Pneumococcal Polysaccharide-23 03/16/2009  . Tdap 03/16/2009     Screening Tests Health Maintenance  Topic Date Due  . INFLUENZA VACCINE  09/25/2017  . DEXA SCAN  12/22/2024 (Originally 11/07/2007)  . TETANUS/TDAP  03/17/2019  . COLONOSCOPY  03/16/2021  . PNA vac Low Risk Adult  Completed         Plan:      PCP Notes   Health Maintenance  Needs flu vaccine - and took in clinic  Education provided regarding shingrix  dexa postponed to 2026 Colonoscopy due 02/2021 Mammogram 07/2011- encouraged her to have one when she feels she can  Abnormal Screens  Depression and evaluated by Dr. Selena Guerrero   Referrals  None  Patient concerns; Misses friends, discussed weaning spouse to caregiver.  Has difficulty letting go  Given resources for spouse delusions or memory issues   Nurse Concerns; As noted   Next PCP apt Was seen today       I have personally reviewed and noted the following in the patient's chart:   . Medical and social history . Use of alcohol, tobacco or  illicit drugs  . Current medications and supplements . Functional ability and status . Nutritional status . Physical activity . Advanced directives . List of other physicians . Hospitalizations, surgeries, and ER visits in previous 12 months . Vitals . Screenings to include cognitive, depression, and falls . Referrals and appointments  In addition, I have reviewed and discussed with patient certain preventive protocols, quality metrics, and best practice recommendations. A written personalized care plan for preventive services as well as general preventive health recommendations were provided to patient.     Montine Circle, RN  12/09/2017

## 2017-12-09 ENCOUNTER — Ambulatory Visit (INDEPENDENT_AMBULATORY_CARE_PROVIDER_SITE_OTHER): Payer: Medicare Other | Admitting: Family Medicine

## 2017-12-09 ENCOUNTER — Encounter: Payer: Self-pay | Admitting: Family Medicine

## 2017-12-09 ENCOUNTER — Ambulatory Visit (INDEPENDENT_AMBULATORY_CARE_PROVIDER_SITE_OTHER): Payer: Medicare Other

## 2017-12-09 VITALS — BP 120/68 | HR 65 | Temp 98.4°F | Ht 65.5 in | Wt 156.3 lb

## 2017-12-09 VITALS — BP 120/68 | HR 65 | Ht 65.5 in | Wt 156.0 lb

## 2017-12-09 DIAGNOSIS — R739 Hyperglycemia, unspecified: Secondary | ICD-10-CM | POA: Diagnosis not present

## 2017-12-09 DIAGNOSIS — F339 Major depressive disorder, recurrent, unspecified: Secondary | ICD-10-CM | POA: Diagnosis not present

## 2017-12-09 DIAGNOSIS — Z23 Encounter for immunization: Secondary | ICD-10-CM

## 2017-12-09 DIAGNOSIS — E785 Hyperlipidemia, unspecified: Secondary | ICD-10-CM | POA: Diagnosis not present

## 2017-12-09 DIAGNOSIS — Z0000 Encounter for general adult medical examination without abnormal findings: Secondary | ICD-10-CM

## 2017-12-09 NOTE — Progress Notes (Signed)
HPI:  Using dictation device. Unfortunately this device frequently misinterprets words/phrases.  Christina Guerrero is a pleasant 77 y.o. here for follow up. Chronic medical problems summarized below were reviewed for changes and stability and were updated as needed below. These issues and their treatment remain stable for the most part.  Reports mood is stable. Sees Dr. Lolly Mustache in psychiatry. PHQ9 always high but reports unchanged. but Denies CP, SOB, DOE, treatment intolerance or new symptoms. Due for labs, did not do the last few times they were ordered. Due for flu shot.  AWV 11/29/16 - seeing [name] for AWV today after my visit with her.  GERD/chronic constipation: -Medications: Prilosec  Restless leg syndrome: Medications: Requip - prescribed by her psychiatrist it appears   Hyperglycemia, hyperlipidemia: -Medications: Crestor -Recommended Mediterranean diet and 150 minutes of aerobic exercise per week  Recurrent depression: -Sees Dr. Lolly Mustache for management -Medications: none -reports has taken many medications and never felt better  ROS: See pertinent positives and negatives per HPI.  Past Medical History:  Diagnosis Date  . Arthritis   . Chicken pox   . Depression   . GERD (gastroesophageal reflux disease)   . High cholesterol   . Restless leg syndrome   . Sleep apnea    borderline  . Urine incontinence   . UTI (urinary tract infection)     Past Surgical History:  Procedure Laterality Date  . APPENDECTOMY  02/2015   LAPROSCOPIC   . COLONOSCOPY  2012  . LAPAROSCOPIC APPENDECTOMY N/A 03/27/2015   Procedure: APPENDECTOMY LAPAROSCOPIC;  Surgeon: Emelia Loron, MD;  Location: [facility] OR;  Service: General;  Laterality: N/A;  . TONSILLECTOMY AND ADENOIDECTOMY  1954    Family History  Problem Relation Age of Onset  . Hyperlipidemia Mother   . Hypertension Mother   . Mental illness Mother   . Stroke Mother   . Thyroid disease Mother   . Depression Mother    . Kidney disease Father   . Mental illness Father   . Alcoholism Father   . Hepatitis Father   . Sudden death Father   . Alcohol abuse Father   . Stroke Maternal Grandfather     SOCIAL HX: see hpi   Current Outpatient Medications:  .  omeprazole (PRILOSEC) 20 MG capsule, TAKE ONE CAPSULE BY MOUTH ONCE DAILY, Disp: 90 capsule, Rfl: 1 .  rOPINIRole (REQUIP) 2 MG tablet, Take 1 tablet (2 mg total) by mouth at bedtime., Disp: 90 tablet, Rfl: 1 .  rosuvastatin (CRESTOR) 20 MG tablet, Take 1 tablet (20 mg total) by mouth daily., Disp: 90 tablet, Rfl: 1  EXAM:  Vitals:   12/09/17 1501  BP: 120/68  Pulse: 65  Temp: 98.4 F (36.9 C)    Body mass index is 25.61 kg/m.  GENERAL: vitals reviewed and listed above, alert, oriented, appears well hydrated and in no acute distress  HEENT: atraumatic, conjunttiva clear, no obvious abnormalities on inspection of external nose and ears  NECK: no obvious masses on inspection  LUNGS: clear to auscultation bilaterally, no wheezes, rales or rhonchi, good air movement  CV: HRRR, no peripheral edema  MS: moves all extremities without noticeable abnormality  PSYCH: pleasant and cooperative, no obvious depression or anxiety  ASSESSMENT AND PLAN:  Discussed the following assessment and plan:  Hyperglycemia - Plan: Hemoglobin A1c  Hyperlipidemia, unspecified hyperlipidemia type - Plan: Cholesterol, total, HDL cholesterol  Depression, recurrent (HCC)  Need for immunization against influenza - Plan: Flu vaccine HIGH DOSE PF (Fluzone High dose)  -  due for labs, ordered, advised to do -seeing susan today for AWV -flu shot -continue with psychiatry for mood, reports is stable, supported in tough situation with her spouse's health -Patient advised to return or notify a doctor immediately if symptoms worsen or persist or new concerns arise.  Patient Instructions  BEFORE YOU LEAVE: -flu shot -labs -AWV -follow up: 4-6 months  We have  ordered labs or studies at this visit. It can take up to 1-2 weeks for results and processing. IF results require follow up or explanation, we will call you with instructions. Clinically stable results will be released to your [facility]. If you have not heard from [place] or cannot find your results in [facility] in 2 weeks please contact our office at [phone].  If you are not yet signed up for [facility], please consider signing up.   We recommend the following healthy lifestyle for LIFE: 1) Small portions. But, make sure to get regular (at least 3 per day), healthy meals and small healthy snacks if needed.  2) Eat a healthy clean diet.   TRY TO EAT: -at least 5-7 servings of low sugar, colorful, and nutrient rich vegetables per day (not corn, potatoes or bananas.) -berries are the best choice if you wish to eat fruit (only eat small amounts if trying to reduce weight)  -lean meets (fish, white meat of chicken or [place]) -vegan proteins for some meals - beans or tofu, whole grains, nuts and seeds -Replace bad fats with good fats - good fats include: fish, nuts and seeds, canola oil, olive oil -small amounts of low fat or non fat dairy -small amounts of100 % whole grains - check the lables -drink plenty of water  AVOID: -SUGAR, sweets, anything with added sugar, corn syrup or sweeteners - must read labels as even foods advertised as "healthy" often are loaded with sugar -if you must have a sweetener, small amounts of stevia may be best -sweetened beverages and artificially sweetened beverages -simple starches (rice, bread, potatoes, pasta, chips, etc - small amounts of 100% whole grains are ok) -red meat, pork, butter -fried foods, fast food, processed food, excessive dairy, eggs and coconut.  3)Get at least 150 minutes of sweaty aerobic exercise per week.  4)Reduce stress - consider counseling, meditation and relaxation to balance other aspects of your life.          [name],  DO

## 2017-12-09 NOTE — Patient Instructions (Signed)
BEFORE YOU LEAVE: -flu shot -labs -AWV -follow up: 4-6 months  We have ordered labs or studies at this visit. It can take up to 1-2 weeks for results and processing. IF results require follow up or explanation, we will call you with instructions. Clinically stable results will be released to your [facility]. If you have not heard from [place] or cannot find your results in [facility] in 2 weeks please contact our office at [phone].  If you are not yet signed up for [facility], please consider signing up.   We recommend the following healthy lifestyle for LIFE: 1) Small portions. But, make sure to get regular (at least 3 per day), healthy meals and small healthy snacks if needed.  2) Eat a healthy clean diet.   TRY TO EAT: -at least 5-7 servings of low sugar, colorful, and nutrient rich vegetables per day (not corn, potatoes or bananas.) -berries are the best choice if you wish to eat fruit (only eat small amounts if trying to reduce weight)  -lean meets (fish, white meat of chicken or [place]) -vegan proteins for some meals - beans or tofu, whole grains, nuts and seeds -Replace bad fats with good fats - good fats include: fish, nuts and seeds, canola oil, olive oil -small amounts of low fat or non fat dairy -small amounts of100 % whole grains - check the lables -drink plenty of water  AVOID: -SUGAR, sweets, anything with added sugar, corn syrup or sweeteners - must read labels as even foods advertised as "healthy" often are loaded with sugar -if you must have a sweetener, small amounts of stevia may be best -sweetened beverages and artificially sweetened beverages -simple starches (rice, bread, potatoes, pasta, chips, etc - small amounts of 100% whole grains are ok) -red meat, pork, butter -fried foods, fast food, processed food, excessive dairy, eggs and coconut.  3)Get at least 150 minutes of sweaty aerobic exercise per week.  4)Reduce stress - consider counseling, meditation and  relaxation to balance other aspects of your life.

## 2017-12-09 NOTE — Patient Instructions (Addendum)
Christina Guerrero , Thank you for taking time to come for your Medicare Wellness Visit. I appreciate your ongoing commitment to your health goals. Please review the following plan we discussed and let me know if I can assist you in the future.   Dr. Maudie Mercury has requested you have labs drawn prior to leaving today   May schedule a mammogram when you have time  You can still have a screening bone density   Shingrix is a vaccine for the prevention of Shingles in Adults 50 and older.  If you are on Medicare, the shingrix is covered under your Part D plan, so you will take both of the vaccines in the series at your pharmacy. Please check with your benefits regarding applicable copays or out of pocket expenses.  The Shingrix is given in 2 vaccines approx 8 weeks apart. You must receive the 2nd dose prior to 6 months from receipt of the first. Please have the pharmacist print out you Immunization  dates for our office records   Will schedule an eye exam this year    These are the goals we discussed: Goals   None     This is a list of the screening recommended for you and due dates:  Health Maintenance  Topic Date Due  . Flu Shot  09/25/2017  . DEXA scan (bone density measurement)  12/22/2024*  . Tetanus Vaccine  03/17/2019  . Colon Cancer Screening  03/16/2021  . Pneumonia vaccines  Completed  *Topic was postponed. The date shown is not the original due date.     Bone Densitometry Bone densitometry is an imaging test that uses a special X-ray to measure the amount of calcium and other minerals in your bones (bone density). This test is also known as a bone mineral density test or dual-energy X-ray absorptiometry (DXA). The test can measure bone density at your hip and your spine. It is similar to having a regular X-ray. You may have this test to:  Diagnose a condition that causes weak or thin bones (osteoporosis).  Predict your risk of a broken bone (fracture).  Determine how well  osteoporosis treatment is working.  Tell a health care provider about:  Any allergies you have.  All medicines you are taking, including vitamins, herbs, eye drops, creams, and over-the-counter medicines.  Any problems you or family members have had with anesthetic medicines.  Any blood disorders you have.  Any surgeries you have had.  Any medical conditions you have.  Possibility of pregnancy.  Any other medical test you had within the previous 14 days that used contrast material. What are the risks? Generally, this is a safe procedure. However, problems can occur and may include the following:  This test exposes you to a very small amount of radiation.  The risks of radiation exposure may be greater to unborn children.  What happens before the procedure?  Do not take any calcium supplements for 24 hours before having the test. You can otherwise eat and drink what you usually do.  Take off all metal jewelry, eyeglasses, dental appliances, and any other metal objects. What happens during the procedure?  You may lie on an exam table. There will be an X-ray generator below you and an imaging device above you.  Other devices, such as boxes or braces, may be used to position your body properly for the scan.  You will need to lie still while the machine slowly scans your body.  The images will show up  on a computer monitor. What happens after the procedure? You may need more testing at a later time. This information is not intended to replace advice given to you by your health care provider. Make sure you discuss any questions you have with your health care provider. Document Released: 03/05/2004 Document Revised: 07/20/2015 Document Reviewed: 07/22/2013 Elsevier Interactive Patient Education  2018 [place] in the Home Falls can cause injuries. They can happen to people of all ages. There are many things you can do to make your home safe and to help  prevent falls. What can I do on the outside of my home?  Regularly fix the edges of walkways and driveways and fix any cracks.  Remove anything that might make you trip as you walk through a door, such as a raised step or threshold.  Trim any bushes or trees on the path to your home.  Use bright outdoor lighting.  Clear any walking paths of anything that might make someone trip, such as rocks or tools.  Regularly check to see if handrails are loose or broken. Make sure that both sides of any steps have handrails.  Any raised decks and porches should have guardrails on the edges.  Have any leaves, snow, or ice cleared regularly.  Use sand or salt on walking paths during winter.  Clean up any spills in your garage right away. This includes oil or grease spills. What can I do in the bathroom?  Use night lights.  Install grab bars by the toilet and in the tub and shower. Do not use towel bars as grab bars.  Use non-skid mats or decals in the tub or shower.  If you need to sit down in the shower, use a plastic, non-slip stool.  Keep the floor dry. Clean up any water that spills on the floor as soon as it happens.  Remove soap buildup in the tub or shower regularly.  Attach bath mats securely with double-sided non-slip rug tape.  Do not have throw rugs and other things on the floor that can make you trip. What can I do in the bedroom?  Use night lights.  Make sure that you have a light by your bed that is easy to reach.  Do not use any sheets or blankets that are too big for your bed. They should not hang down onto the floor.  Have a firm chair that has side arms. You can use this for support while you get dressed.  Do not have throw rugs and other things on the floor that can make you trip. What can I do in the kitchen?  Clean up any spills right away.  Avoid walking on wet floors.  Keep items that you use a lot in easy-to-reach places.  If you need to reach  something above you, use a strong step stool that has a grab bar.  Keep electrical cords out of the way.  Do not use floor polish or wax that makes floors slippery. If you must use wax, use non-skid floor wax.  Do not have throw rugs and other things on the floor that can make you trip. What can I do with my stairs?  Do not leave any items on the stairs.  Make sure that there are handrails on both sides of the stairs and use them. Fix handrails that are broken or loose. Make sure that handrails are as long as the stairways.  Check any carpeting to make sure  that it is firmly attached to the stairs. Fix any carpet that is loose or worn.  Avoid having throw rugs at the top or bottom of the stairs. If you do have throw rugs, attach them to the floor with carpet tape.  Make sure that you have a light switch at the top of the stairs and the bottom of the stairs. If you do not have them, ask someone to add them for you. What else can I do to help prevent falls?  Wear shoes that: ? Do not have high heels. ? Have rubber bottoms. ? Are comfortable and fit you well. ? Are closed at the toe. Do not wear sandals.  If you use a stepladder: ? Make sure that it is fully opened. Do not climb a closed stepladder. ? Make sure that both sides of the stepladder are locked into place. ? Ask someone to hold it for you, if possible.  Clearly mark and make sure that you can see: ? Any grab bars or handrails. ? First and last steps. ? Where the edge of each step is.  Use tools that help you move around (mobility aids) if they are needed. These include: ? Canes. ? Walkers. ? Scooters. ? Crutches.  Turn on the lights when you go into a dark area. Replace any light bulbs as soon as they burn out.  Set up your furniture so you have a clear path. Avoid moving your furniture around.  If any of your floors are uneven, fix them.  If there are any pets around you, be aware of where they are.  Review  your medicines with your doctor. Some medicines can make you feel dizzy. This can increase your chance of falling. Ask your doctor what other things that you can do to help prevent falls. This information is not intended to replace advice given to you by your health care provider. Make sure you discuss any questions you have with your health care provider. Document Released: 12/08/2008 Document Revised: 07/20/2015 Document Reviewed: 03/18/2014 Elsevier Interactive Patient Education  2018 [place] Maintenance, Female Adopting a healthy lifestyle and getting preventive care can go a long way to promote health and wellness. Talk with your health care provider about what schedule of regular examinations is right for you. This is a good chance for you to check in with your provider about disease prevention and staying healthy. In between checkups, there are plenty of things you can do on your own. Experts have done a lot of research about which lifestyle changes and preventive measures are most likely to keep you healthy. Ask your health care provider for more information. Weight and diet Eat a healthy diet  Be sure to include plenty of vegetables, fruits, low-fat dairy products, and lean protein.  Do not eat a lot of foods high in solid fats, added sugars, or salt.  Get regular exercise. This is one of the most important things you can do for your health. ? Most adults should exercise for at least 150 minutes each week. The exercise should increase your heart rate and make you sweat (moderate-intensity exercise). ? Most adults should also do strengthening exercises at least twice a week. This is in addition to the moderate-intensity exercise.  Maintain a healthy weight  Body mass index (BMI) is a measurement that can be used to identify possible weight problems. It estimates body fat based on height and weight. Your health care provider can help determine your BMI and  help you achieve  or maintain a healthy weight.  For females 39 years of age and older: ? A BMI below 18.5 is considered underweight. ? A BMI of 18.5 to 24.9 is normal. ? A BMI of 25 to 29.9 is considered overweight. ? A BMI of 30 and above is considered obese.  Watch levels of cholesterol and blood lipids  You should start having your blood tested for lipids and cholesterol at 75 years of age, then have this test every 5 years.  You may need to have your cholesterol levels checked more often if: ? Your lipid or cholesterol levels are high. ? You are older than 75 years of age. ? You are at high risk for heart disease.  Cancer screening Lung Cancer  Lung cancer screening is recommended for adults 14-65 years old who are at high risk for lung cancer because of a history of smoking.  A yearly low-dose CT scan of the lungs is recommended for people who: ? Currently smoke. ? Have quit within the past 15 years. ? Have at least a 30-pack-year history of smoking. A pack year is smoking an average of one pack of cigarettes a day for 1 year.  Yearly screening should continue until it has been 15 years since you quit.  Yearly screening should stop if you develop a health problem that would prevent you from having lung cancer treatment.  Breast Cancer  Practice breast self-awareness. This means understanding how your breasts normally appear and feel.  It also means doing regular breast self-exams. Let your health care provider know about any changes, no matter how small.  If you are in your 20s or 30s, you should have a clinical breast exam (CBE) by a health care provider every 1-3 years as part of a regular health exam.  If you are 76 or older, have a CBE every year. Also consider having a breast X-ray (mammogram) every year.  If you have a family history of breast cancer, talk to your health care provider about genetic screening.  If you are at high risk for breast cancer, talk to your health care  provider about having an MRI and a mammogram every year.  Breast cancer gene (BRCA) assessment is recommended for women who have family members with BRCA-related cancers. BRCA-related cancers include: ? Breast. ? Ovarian. ? Tubal. ? Peritoneal cancers.  Results of the assessment will determine the need for genetic counseling and BRCA1 and BRCA2 testing.  Cervical Cancer Your health care provider may recommend that you be screened regularly for cancer of the pelvic organs (ovaries, uterus, and vagina). This screening involves a pelvic examination, including checking for microscopic changes to the surface of your cervix (Pap test). You may be encouraged to have this screening done every 3 years, beginning at age 25.  For women ages 10-65, health care providers may recommend pelvic exams and Pap testing every 3 years, or they may recommend the Pap and pelvic exam, combined with testing for human papilloma virus (HPV), every 5 years. Some types of HPV increase your risk of cervical cancer. Testing for HPV may also be done on women of any age with unclear Pap test results.  Other health care providers may not recommend any screening for nonpregnant women who are considered low risk for pelvic cancer and who do not have symptoms. Ask your health care provider if a screening pelvic exam is right for you.  If you have had past treatment for cervical cancer or a  condition that could lead to cancer, you need Pap tests and screening for cancer for at least 20 years after your treatment. If Pap tests have been discontinued, your risk factors (such as having a new sexual partner) need to be reassessed to determine if screening should resume. Some women have medical problems that increase the chance of getting cervical cancer. In these cases, your health care provider may recommend more frequent screening and Pap tests.  Colorectal Cancer  This type of cancer can be detected and often prevented.  Routine  colorectal cancer screening usually begins at 75 years of age and continues through 75 years of age.  Your health care provider may recommend screening at an earlier age if you have risk factors for colon cancer.  Your health care provider may also recommend using home test kits to check for hidden blood in the stool.  A small camera at the end of a tube can be used to examine your colon directly (sigmoidoscopy or colonoscopy). This is done to check for the earliest forms of colorectal cancer.  Routine screening usually begins at age 6.  Direct examination of the colon should be repeated every 5-10 years through 75 years of age. However, you may need to be screened more often if early forms of precancerous polyps or small growths are found.  Skin Cancer  Check your skin from head to toe regularly.  Tell your health care provider about any new moles or changes in moles, especially if there is a change in a mole's shape or color.  Also tell your health care provider if you have a mole that is larger than the size of a pencil eraser.  Always use sunscreen. Apply sunscreen liberally and repeatedly throughout the day.  Protect yourself by wearing long sleeves, pants, a wide-brimmed hat, and sunglasses whenever you are outside.  Heart disease, diabetes, and high blood pressure  High blood pressure causes heart disease and increases the risk of stroke. High blood pressure is more likely to develop in: ? People who have blood pressure in the high end of the normal range (130-139/85-89 mm Hg). ? People who are overweight or obese. ? People who are African American.  If you are 40-1 years of age, have your blood pressure checked every 3-5 years. If you are 57 years of age or older, have your blood pressure checked every year. You should have your blood pressure measured twice-once when you are at a hospital or clinic, and once when you are not at a hospital or clinic. Record the average of the  two measurements. To check your blood pressure when you are not at a hospital or clinic, you can use: ? An automated blood pressure machine at a pharmacy. ? A home blood pressure monitor.  If you are between 74 years and 75 years old, ask your health care provider if you should take aspirin to prevent strokes.  Have regular diabetes screenings. This involves taking a blood sample to check your fasting blood sugar level. ? If you are at a normal weight and have a low risk for diabetes, have this test once every three years after 75 years of age. ? If you are overweight and have a high risk for diabetes, consider being tested at a younger age or more often. Preventing infection Hepatitis B  If you have a higher risk for hepatitis B, you should be screened for this virus. You are considered at high risk for hepatitis B if: ? You  were born in a country where hepatitis B is common. Ask your health care provider which countries are considered high risk. ? Your parents were born in a high-risk country, and you have not been immunized against hepatitis B (hepatitis B vaccine). ? You have HIV or AIDS. ? You use needles to inject street drugs. ? You live with someone who has hepatitis B. ? You have had sex with someone who has hepatitis B. ? You get hemodialysis treatment. ? You take certain medicines for conditions, including cancer, organ transplantation, and autoimmune conditions.  Hepatitis C  Blood testing is recommended for: ? Everyone born from [birth year] through 1963. ? Anyone with known risk factors for hepatitis C.  Sexually transmitted infections (STIs)  You should be screened for sexually transmitted infections (STIs) including gonorrhea and chlamydia if: ? You are sexually active and are younger than 75 years of age. ? You are older than 75 years of age and your health care provider tells you that you are at risk for this type of infection. ? Your sexual activity has changed since you  were last screened and you are at an increased risk for chlamydia or gonorrhea. Ask your health care provider if you are at risk.  If you do not have HIV, but are at risk, it may be recommended that you take a prescription medicine daily to prevent HIV infection. This is called pre-exposure prophylaxis (PrEP). You are considered at risk if: ? You are sexually active and do not regularly use condoms or know the HIV status of your partner(s). ? You take drugs by injection. ? You are sexually active with a partner who has HIV.  Talk with your health care provider about whether you are at high risk of being infected with HIV. If you choose to begin PrEP, you should first be tested for HIV. You should then be tested every 3 months for as long as you are taking PrEP. Pregnancy  If you are premenopausal and you may become pregnant, ask your health care provider about preconception counseling.  If you may become pregnant, take 400 to 800 micrograms (mcg) of folic acid every day.  If you want to prevent pregnancy, talk to your health care provider about birth control (contraception). Osteoporosis and menopause  Osteoporosis is a disease in which the bones lose minerals and strength with aging. This can result in serious bone fractures. Your risk for osteoporosis can be identified using a bone density scan.  If you are 69 years of age or older, or if you are at risk for osteoporosis and fractures, ask your health care provider if you should be screened.  Ask your health care provider whether you should take a calcium or vitamin D supplement to lower your risk for osteoporosis.  Menopause may have certain physical symptoms and risks.  Hormone replacement therapy may reduce some of these symptoms and risks. Talk to your health care provider about whether hormone replacement therapy is right for you. Follow these instructions at home:  Schedule regular health, dental, and eye exams.  Stay current  with your immunizations.  Do not use any tobacco products including cigarettes, chewing tobacco, or electronic cigarettes.  If you are pregnant, do not drink alcohol.  If you are breastfeeding, limit how much and how often you drink alcohol.  Limit alcohol intake to no more than 1 drink per day for nonpregnant women. One drink equals 12 ounces of beer, 5 ounces of wine, or 1 ounces of hard  liquor.  Do not use street drugs.  Do not share needles.  Ask your health care provider for help if you need support or information about quitting drugs.  Tell your health care provider if you often feel depressed.  Tell your health care provider if you have ever been abused or do not feel safe at home. This information is not intended to replace advice given to you by your health care provider. Make sure you discuss any questions you have with your health care provider. Document Released: 08/27/2010 Document Revised: 07/20/2015 Document Reviewed: 11/15/2014 Elsevier Interactive Patient Education  [employer].

## 2017-12-09 NOTE — Progress Notes (Signed)
Murle Otting R Dedric Ethington, DO  

## 2017-12-10 LAB — HDL CHOLESTEROL: HDL: 62.6 mg/dL (ref >39.00)

## 2017-12-10 LAB — CHOLESTEROL, TOTAL: Cholesterol: 195 mg/dL (ref 0–200)

## 2017-12-10 LAB — HEMOGLOBIN A1C: Hgb A1c MFr Bld: 5 % (ref 4.6–6.5)

## 2017-12-16 ENCOUNTER — Other Ambulatory Visit (HOSPITAL_COMMUNITY): Payer: Self-pay | Admitting: Psychiatry

## 2017-12-16 DIAGNOSIS — F331 Major depressive disorder, recurrent, moderate: Secondary | ICD-10-CM

## 2017-12-22 ENCOUNTER — Telehealth (HOSPITAL_COMMUNITY): Payer: Self-pay

## 2017-12-22 ENCOUNTER — Other Ambulatory Visit (HOSPITAL_COMMUNITY): Payer: Self-pay | Admitting: Psychiatry

## 2017-12-22 ENCOUNTER — Telehealth: Payer: Self-pay | Admitting: *Deleted

## 2017-12-22 MED ORDER — VENLAFAXINE HCL ER 150 MG PO CP24: 150.0000 mg | ORAL_CAPSULE | Freq: Every day | ORAL | 2 refills | Status: DC

## 2017-12-22 NOTE — Telephone Encounter (Signed)
Thank you, patient was very grateful for you sending this medication into the pharmacy

## 2017-12-22 NOTE — Telephone Encounter (Signed)
I called Effexor XL 150 mg daily.  Not sure why it was discontinued by other provider office.  Please call the patient to pick up the medication.

## 2017-12-22 NOTE — Telephone Encounter (Signed)
Patient called upset because she wants to know why she was denied her request for Effexor XR 150mg . Patient said that she is currently having bad withdrawals and she don't understand what is going on.  Next appointment is scheduled for 12-31-17.   Please review

## 2017-12-22 NOTE — Telephone Encounter (Signed)
Called patient and she is very upset. She had placed a call to her PCP office in hopes to get Effexor XR because she is currently having bad withdrawals. Informed patient would speak with Dr.  About refill and call her back.

## 2017-12-22 NOTE — Telephone Encounter (Signed)
I called the pt and she stated she has been on Venlafaxine ER 150mg -takes once a day at breakfast for 5 years.  States she has been out of this for 3 days, does not feel right without this medication and has not seen the psychiatrist (Dr Lolly Mustache)  that prescribes this as he was out of the office on medical leave and he will not give her a refill.  States she has an appt on 11/6 with Dr Lolly Mustache and questioned if Dr Selena Batten could give her a refill now?  Message sent to Dr Selena Batten and the pt is aware this will be addressed on Tuesday morning.

## 2017-12-22 NOTE — Telephone Encounter (Signed)
Her Effexor was discontinued by other provider`s office.  It appears it was reorder last week.  Please check with the pharmacy and the patient if she has not received Effexor then called Effexor XR and 50 mg daily which she was taking through our office.

## 2017-12-22 NOTE — Telephone Encounter (Signed)
Copied from CRM [id number]. Topic: General - Inquiry    Dec 22, 2017  3:56 PM Gaynelle Adu wrote: Reason for CRM: Patient is requesting Dr.Kim or her nurse to give her a call in regards to requesting a medication, but stated she would like to discuss it first. With a provider. Please advise

## 2017-12-23 MED ORDER — VENLAFAXINE HCL ER 150 MG PO CP24: 150.0000 mg | ORAL_CAPSULE | Freq: Every day | ORAL | 0 refills | Status: DC

## 2017-12-23 NOTE — Telephone Encounter (Signed)
Please call patient. It looks like this as been addressed by her psychiatrist? If not ok to send a few weeks to get to her appointment. Thanks!

## 2017-12-23 NOTE — Telephone Encounter (Signed)
I called the pt and left a detailed message stating the refill was sent to her pharmacy.

## 2017-12-25 ENCOUNTER — Other Ambulatory Visit: Payer: Self-pay | Admitting: Family Medicine

## 2017-12-31 ENCOUNTER — Ambulatory Visit (INDEPENDENT_AMBULATORY_CARE_PROVIDER_SITE_OTHER): Payer: Medicare Other | Admitting: Psychiatry

## 2017-12-31 ENCOUNTER — Encounter (HOSPITAL_COMMUNITY): Payer: Self-pay | Admitting: Psychiatry

## 2017-12-31 VITALS — BP 154/68 | HR 62 | Ht 65.5 in | Wt 154.2 lb

## 2017-12-31 DIAGNOSIS — F331 Major depressive disorder, recurrent, moderate: Secondary | ICD-10-CM

## 2017-12-31 DIAGNOSIS — G2581 Restless legs syndrome: Secondary | ICD-10-CM | POA: Diagnosis not present

## 2017-12-31 MED ORDER — ROPINIROLE HCL 2 MG PO TABS: 2.0000 mg | ORAL_TABLET | Freq: Every day | ORAL | 1 refills | Status: DC

## 2017-12-31 MED ORDER — ROPINIROLE HCL 0.5 MG PO TABS: 0.5000 mg | ORAL_TABLET | Freq: Every day | ORAL | 1 refills | Status: DC

## 2017-12-31 MED ORDER — VENLAFAXINE HCL ER 150 MG PO CP24: 150.0000 mg | ORAL_CAPSULE | Freq: Every day | ORAL | 1 refills | Status: DC
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
## 2018-01-13 ENCOUNTER — Other Ambulatory Visit: Payer: Self-pay

## 2018-02-07 ENCOUNTER — Other Ambulatory Visit: Payer: Self-pay | Admitting: Family Medicine

## 2018-03-02 ENCOUNTER — Other Ambulatory Visit: Payer: Self-pay | Admitting: Family Medicine

## 2018-03-02 MED ORDER — OMEPRAZOLE 20 MG PO CPDR: 20.0000 mg | DELAYED_RELEASE_CAPSULE | Freq: Every day | ORAL | 1 refills | Status: DC

## 2018-03-02 NOTE — Telephone Encounter (Signed)
Rx done. 

## 2018-03-02 NOTE — Telephone Encounter (Signed)
Copied from CRM #[id number]. Topic: Quick Communication - Rx Refill/Question >> Mar 02, 2018 10:43 AM Baldo Daub L wrote: Medication:  omeprazole (PRILOSEC) 20 MG capsule   Has the patient contacted their pharmacy? Yes - pharmacy reaching out (Agent: If no, request that the patient contact the pharmacy for the refill.) (Agent: If yes, when and what did the pharmacy advise?)  Preferred Pharmacy (with phone number or street name): [id number] [place], [place] - [address] AT [facility] [address] [facility] [address] [phone] (Phone) [phone] (Fax)  Agent: Please be advised that RX refills may take up to 3 business days. We ask that you follow-up with your pharmacy.

## 2018-04-29 ENCOUNTER — Ambulatory Visit (INDEPENDENT_AMBULATORY_CARE_PROVIDER_SITE_OTHER): Payer: Medicare Other | Admitting: Psychology

## 2018-04-29 DIAGNOSIS — F339 Major depressive disorder, recurrent, unspecified: Secondary | ICD-10-CM

## 2018-05-11 ENCOUNTER — Ambulatory Visit (INDEPENDENT_AMBULATORY_CARE_PROVIDER_SITE_OTHER): Payer: Medicare Other | Admitting: Psychology

## 2018-05-11 DIAGNOSIS — F339 Major depressive disorder, recurrent, unspecified: Secondary | ICD-10-CM | POA: Diagnosis not present

## 2018-05-25 ENCOUNTER — Ambulatory Visit: Payer: Medicare Other | Admitting: Psychology

## 2018-06-08 ENCOUNTER — Ambulatory Visit: Payer: Medicare Other | Admitting: Psychology

## 2018-06-15 ENCOUNTER — Other Ambulatory Visit: Payer: Self-pay | Admitting: Family Medicine

## 2018-06-22 ENCOUNTER — Ambulatory Visit: Payer: Medicare Other | Admitting: Psychology

## 2018-07-01 ENCOUNTER — Ambulatory Visit (HOSPITAL_COMMUNITY): Payer: Medicare Other | Admitting: Psychiatry

## 2018-07-06 ENCOUNTER — Ambulatory Visit: Payer: Medicare Other | Admitting: Psychology

## 2018-07-06 ENCOUNTER — Telehealth (HOSPITAL_COMMUNITY): Payer: Self-pay

## 2018-07-06 DIAGNOSIS — F331 Major depressive disorder, recurrent, moderate: Secondary | ICD-10-CM

## 2018-07-06 NOTE — Telephone Encounter (Signed)
Medication refill - Fax received for a refill of patient's prescribed Ropnirole 2 mg, one at bedtime, last ordered 12/31/17 for 90 days + 1 refill.  Patient cancelled appt 07/01/18 and has not rescheduled.

## 2018-07-06 NOTE — Telephone Encounter (Signed)
Provide 30-day supply for Requip 2 mg.  She may afraid to come to office but tell her that we are doing virtual visits and she can reschedule appointment.

## 2018-07-08 MED ORDER — ROPINIROLE HCL 2 MG PO TABS: 2.0000 mg | ORAL_TABLET | Freq: Every day | ORAL | 0 refills | Status: DC

## 2018-07-08 NOTE — Telephone Encounter (Signed)
Medication management - Telephone call with patient to inform Dr. Lolly Mustache sent in a new Requip 2 mg one a day order for 30 days and helped patient reschedule missed appt for 07/13/18 at 3:40pm.  Patient reported she was available at last arranged time but never received a call or Webex invite.  Verified patient's phone number and patient to call our office if Dr. Lolly Mustache does not connect with her on 07/13/18 at 3:40pm.  New Requip order sent as verbally ordered by Dr. Lolly Mustache.

## 2018-07-13 ENCOUNTER — Encounter (HOSPITAL_COMMUNITY): Payer: Self-pay | Admitting: Psychiatry

## 2018-07-13 ENCOUNTER — Ambulatory Visit (INDEPENDENT_AMBULATORY_CARE_PROVIDER_SITE_OTHER): Payer: Medicare Other | Admitting: Psychiatry

## 2018-07-13 ENCOUNTER — Other Ambulatory Visit: Payer: Self-pay

## 2018-07-13 DIAGNOSIS — F331 Major depressive disorder, recurrent, moderate: Secondary | ICD-10-CM

## 2018-07-13 DIAGNOSIS — G2581 Restless legs syndrome: Secondary | ICD-10-CM | POA: Diagnosis not present

## 2018-07-13 MED ORDER — VENLAFAXINE HCL ER 150 MG PO CP24: 150.0000 mg | ORAL_CAPSULE | Freq: Every day | ORAL | 1 refills | Status: DC

## 2018-07-13 MED ORDER — ROPINIROLE HCL 2 MG PO TABS: 2.0000 mg | ORAL_TABLET | Freq: Every day | ORAL | 1 refills | Status: DC
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
## 2018-07-21 ENCOUNTER — Ambulatory Visit: Payer: Medicare Other | Admitting: Psychology

## 2018-09-04 ENCOUNTER — Other Ambulatory Visit: Payer: Self-pay | Admitting: Family Medicine

## 2018-11-24 ENCOUNTER — Other Ambulatory Visit: Payer: Self-pay | Admitting: Family Medicine

## 2019-01-04 ENCOUNTER — Ambulatory Visit (INDEPENDENT_AMBULATORY_CARE_PROVIDER_SITE_OTHER): Payer: Medicare Other | Admitting: *Deleted

## 2019-01-04 ENCOUNTER — Other Ambulatory Visit: Payer: Self-pay

## 2019-01-04 DIAGNOSIS — Z23 Encounter for immunization: Secondary | ICD-10-CM

## 2019-01-06 ENCOUNTER — Other Ambulatory Visit (HOSPITAL_COMMUNITY): Payer: Self-pay | Admitting: Psychiatry

## 2019-01-06 DIAGNOSIS — G2581 Restless legs syndrome: Secondary | ICD-10-CM

## 2019-01-06 DIAGNOSIS — F331 Major depressive disorder, recurrent, moderate: Secondary | ICD-10-CM

## 2019-01-13 ENCOUNTER — Encounter (HOSPITAL_COMMUNITY): Payer: Self-pay | Admitting: Psychiatry

## 2019-01-13 ENCOUNTER — Other Ambulatory Visit: Payer: Self-pay

## 2019-01-13 ENCOUNTER — Ambulatory Visit (INDEPENDENT_AMBULATORY_CARE_PROVIDER_SITE_OTHER): Payer: Medicare Other | Admitting: Psychiatry

## 2019-01-13 DIAGNOSIS — F331 Major depressive disorder, recurrent, moderate: Secondary | ICD-10-CM

## 2019-01-13 DIAGNOSIS — G2581 Restless legs syndrome: Secondary | ICD-10-CM | POA: Diagnosis not present

## 2019-01-13 MED ORDER — VENLAFAXINE HCL ER 150 MG PO CP24: 150.0000 mg | ORAL_CAPSULE | Freq: Every day | ORAL | 1 refills | Status: DC

## 2019-01-13 MED ORDER — ROPINIROLE HCL 2 MG PO TABS: 2.0000 mg | ORAL_TABLET | Freq: Every day | ORAL | 1 refills | Status: DC

## 2019-01-13 MED ORDER — ROPINIROLE HCL 0.5 MG PO TABS: 0.5000 mg | ORAL_TABLET | Freq: Every day | ORAL | 1 refills | Status: DC

## 2019-01-13 NOTE — Progress Notes (Signed)
Virtual Visit via Telephone Note  I connected with Christina Guerrero on 01/13/19 at  3:40 PM EST by telephone and verified that I am speaking with the correct person using two identifiers.   I discussed the limitations, risks, security and privacy concerns of performing an evaluation and management service by telephone and the availability of in person appointments. I also discussed with the patient that there may be a patient responsible charge related to this service. The patient expressed understanding and agreed to proceed.   History of Present Illness: Patient was evaluated by phone session.  She admitted some stress recently because her husband health condition is gradually declining.  She is concerned about her memory.  Due to Covid patient cannot go outside and her husband cannot walk.  Patient told his PCP started recently in-house physical therapy and home health aide help.  She is pleased with that but sometime she gets very anxious especially at night.  Her restless leg is also bothering her.  Despite taking 2 mg of Requip every night there are nights when she has difficulty sleeping all night.  She takes Requip 0.5 mg very rarely that helps her a lot.  She has limited support.  Her daughter lives in [place] however she talks to her daughter and grandkids very frequently.  Her appetite is okay.  Energy level is good.  She denies any weight change.  She denies any tremors shakes or any EPS.  She is compliant with Effexor.   Past Psychiatric History:Reviewed. H/O depression started in 1989.  Seen psychiatrist Dr. Viviano Simas. Took klonopin caused thinning of hair and Wellbutrin which did not help.  No history of suicidal attempt, inpatient treatment, paranoia, hallucination, aggressive behavior, mania or any psychosis.    Psychiatric Specialty Exam: Physical Exam  ROS  There were no vitals taken for this visit.There is no height or weight on file to calculate BMI.  General  Appearance: NA  Eye Contact:  NA  Speech:  Clear and Coherent  Volume:  Normal  Mood:  Dysphoric  Affect:  NA  Thought Process:  Goal Directed  Orientation:  Full (Time, Place, and Person)  Thought Content:  WDL  Suicidal Thoughts:  No  Homicidal Thoughts:  No  Memory:  Immediate;   Good Recent;   Good Remote;   Good  Judgement:  Good  Insight:  Good  Psychomotor Activity:  NA  Concentration:  Concentration: Fair and Attention Span: Fair  Recall:  Good  Fund of Knowledge:  Good  Language:  Good  Akathisia:  No  Handed:  Right  AIMS (if indicated):     Assets:  Communication Skills Desire for Improvement Housing Resilience Social Support  ADL's:  Intact  Cognition:  WNL  Sleep:   fair, restless leg      Assessment and Plan: Major depressive disorder, recurrent.  Restless leg syndrome.  I recommend to take extra Requip 0.5 mg more frequently since it is helping her restless leg.  She will continue Requip 2 mg every night and Effexor XR 150 mg every day.  Patient is not interested in therapy.  Discussed medication side effects and benefits.  Recommended to call us back if she has any question of any concern.  Follow-up in 6 months.  Follow Up Instructions:    I discussed the assessment and treatment plan with the patient. The patient was provided an opportunity to ask questions and all were answered. The patient agreed with the plan and demonstrated an understanding  of the instructions.   The patient was advised to call back or seek an in-person evaluation if the symptoms worsen or if the condition fails to improve as anticipated.  I provided 20 minutes of non-face-to-face time during this encounter.   Kathlee Nations, MD

## 2019-01-20 ENCOUNTER — Other Ambulatory Visit: Payer: Self-pay

## 2019-02-25 ENCOUNTER — Other Ambulatory Visit (HOSPITAL_COMMUNITY): Payer: Self-pay | Admitting: Psychiatry

## 2019-02-25 DIAGNOSIS — G2581 Restless legs syndrome: Secondary | ICD-10-CM

## 2019-02-25 DIAGNOSIS — F331 Major depressive disorder, recurrent, moderate: Secondary | ICD-10-CM

## 2019-03-11 ENCOUNTER — Other Ambulatory Visit: Payer: Self-pay

## 2019-03-12 ENCOUNTER — Encounter: Payer: Self-pay | Admitting: Family Medicine

## 2019-03-12 ENCOUNTER — Ambulatory Visit (INDEPENDENT_AMBULATORY_CARE_PROVIDER_SITE_OTHER): Payer: Medicare Other | Admitting: Family Medicine

## 2019-03-12 VITALS — BP 102/60 | HR 66 | Temp 97.6°F | Wt 159.8 lb

## 2019-03-12 DIAGNOSIS — E785 Hyperlipidemia, unspecified: Secondary | ICD-10-CM

## 2019-03-12 DIAGNOSIS — R739 Hyperglycemia, unspecified: Secondary | ICD-10-CM | POA: Diagnosis not present

## 2019-03-12 DIAGNOSIS — F331 Major depressive disorder, recurrent, moderate: Secondary | ICD-10-CM

## 2019-03-12 DIAGNOSIS — G2581 Restless legs syndrome: Secondary | ICD-10-CM

## 2019-03-12 DIAGNOSIS — K219 Gastro-esophageal reflux disease without esophagitis: Secondary | ICD-10-CM

## 2019-03-12 MED ORDER — OMEPRAZOLE 20 MG PO CPDR: DELAYED_RELEASE_CAPSULE | ORAL | 1 refills | Status: DC

## 2019-03-12 MED ORDER — ROPINIROLE HCL 2 MG PO TABS: 2.0000 mg | ORAL_TABLET | Freq: Every day | ORAL | 1 refills | Status: DC

## 2019-03-12 MED ORDER — ROPINIROLE HCL 0.5 MG PO TABS: 0.5000 mg | ORAL_TABLET | Freq: Every day | ORAL | 1 refills | Status: DC

## 2019-03-12 NOTE — Patient Instructions (Signed)
Consider Tdap vaccine.  I'll be in touch about shingles vaccine.

## 2019-03-12 NOTE — Progress Notes (Signed)
Christina Guerrero DOB: 1940-10-09 Encounter date: 03/12/2019  This isa 79 y.o. female who presents to establish care. Chief Complaint  Patient presents with  . Establish Care    History of present illness: Husband demented sometimes, sometimes lucid. Sometimes not sleeping. She is in house 24/7 now. Has someone come in three mornings/week, but with covid can't go anywhere.   GERD: fine as long as she takes omeprazole daily.   Restless legs: fine with ropinerole; but starting to wear off sooner. Takes the 0.5 for naps and then the 2mg  in evening.   HL: taking rosuvastin daily. No problems with medication.   Effexor: doing ok with this.   Has had local reaction with Tetanus in past.   Past Medical History:  Diagnosis Date  . Arthritis   . Chicken pox   . Depression   . GERD (gastroesophageal reflux disease)   . High cholesterol   . Restless leg syndrome   . Sleep apnea    borderline  . Urine incontinence   . UTI (urinary tract infection)    Past Surgical History:  Procedure Laterality Date  . APPENDECTOMY  02/2015   LAPROSCOPIC   . COLONOSCOPY  2012  . LAPAROSCOPIC APPENDECTOMY N/A 03/27/2015   Procedure: APPENDECTOMY LAPAROSCOPIC;  Surgeon: 03/29/2015, MD;  Location: [facility] OR;  Service: General;  Laterality: N/A;  . TONSILLECTOMY AND ADENOIDECTOMY  1954   Allergies  Allergen Reactions  . Other     propolene glycol   Current Meds  Medication Sig  . omeprazole (PRILOSEC) 20 MG capsule TAKE 1 CAPSULE(20 MG) BY MOUTH DAILY  . rOPINIRole (REQUIP) 0.5 MG tablet Take 1 tablet (0.5 mg total) by mouth at bedtime.  [facility] rOPINIRole (REQUIP) 2 MG tablet Take 1 tablet (2 mg total) by mouth at bedtime. Appointment required for further refills.  . rosuvastatin (CRESTOR) 20 MG tablet TAKE 1 TABLET BY MOUTH DAILY  . venlafaxine XR (EFFEXOR-XR) 150 MG 24 hr capsule Take 1 capsule (150 mg total) by mouth daily.  . [DISCONTINUED] omeprazole (PRILOSEC) 20 MG capsule TAKE 1  CAPSULE(20 MG) BY MOUTH DAILY  . [DISCONTINUED] rOPINIRole (REQUIP) 0.5 MG tablet Take 1 tablet (0.5 mg total) by mouth at bedtime.  . [DISCONTINUED] rOPINIRole (REQUIP) 2 MG tablet Take 1 tablet (2 mg total) by mouth at bedtime. Appointment required for further refills.   Social History   Tobacco Use  . Smoking status: Former Marland Kitchen  . Smokeless tobacco: Never Used  . Tobacco comment: QUIT SMOKING  IN 1989  Substance Use Topics  . Alcohol use: Yes    Alcohol/week: 2.0 standard drinks    Types: 2 Glasses of wine per week    Comment: glass of wine once per day   Family History  Problem Relation Age of Onset  . Hyperlipidemia Mother   . Hypertension Mother   . Mental illness Mother   . Stroke Mother 62  . Thyroid disease Mother   . Depression Mother   . Kidney disease Father   . Mental illness Father   . Alcoholism Father   . Hepatitis Father   . Sudden death Father   . Alcohol abuse Father   . Stroke Maternal Grandfather      Review of Systems  Constitutional: Negative for chills, fatigue and fever.  Respiratory: Negative for cough, chest tightness, shortness of breath and wheezing.   Cardiovascular: Negative for chest pain, palpitations and leg swelling.    Objective:  BP 102/60 (BP Location: Left Arm, Patient Position:  Sitting, Cuff Size: Normal)   Pulse 66   Temp 97.6 F (36.4 C) (Temporal)   Wt 159 lb 12.8 oz (72.5 kg)   SpO2 97%   BMI 26.19 kg/m   Weight: 159 lb 12.8 oz (72.5 kg)   BP Readings from Last 3 Encounters:  03/12/19 102/60  12/09/17 120/68  12/09/17 120/68   Wt Readings from Last 3 Encounters:  03/12/19 159 lb 12.8 oz (72.5 kg)  12/09/17 156 lb (70.8 kg)  12/09/17 156 lb 4.8 oz (70.9 kg)    Physical Exam Constitutional:      General: She is not in acute distress.    Appearance: She is well-developed.  Cardiovascular:     Rate and Rhythm: Normal rate and regular rhythm.     Heart sounds: Normal heart sounds. No murmur. No friction rub.   Pulmonary:     Effort: Pulmonary effort is normal. No respiratory distress.     Breath sounds: Normal breath sounds. No wheezing or rales.  Musculoskeletal:     Right lower leg: No edema.     Left lower leg: No edema.  Neurological:     Mental Status: She is alert and oriented to person, place, and time.  Psychiatric:        Behavior: Behavior normal.     Assessment/Plan:  1. Major depressive disorder, recurrent episode, moderate (HCC) Mood is stable.  She has additional stress managing husband with dementia.  Continue Effexor.   2. RLS (restless legs syndrome) Controlled with Requip.  She is not getting quite as long of benefit on some nights, but okay with continuing current dose for now.  We can discuss increase if desired. - rOPINIRole (REQUIP) 2 MG tablet; Take 1 tablet (2 mg total) by mouth at bedtime. Appointment required for further refills.  Dispense: 90 tablet; Refill: 1 - rOPINIRole (REQUIP) 0.5 MG tablet; Take 1 tablet (0.5 mg total) by mouth at bedtime.  Dispense: 90 tablet; Refill: 1 - CBC with Differential/Platelet; Future  3. Hyperglycemia - Hemoglobin A1c; Future  4. Hyperlipidemia, unspecified hyperlipidemia type Continue Crestor 20 mg daily. - Comprehensive metabolic panel; Future - Lipid panel; Future  5. Gastroesophageal reflux disease, unspecified whether esophagitis present Well-controlled.  Not interested in trial of med decrease. - omeprazole (PRILOSEC) 20 MG capsule; TAKE 1 CAPSULE(20 MG) BY MOUTH DAILY  Dispense: 90 capsule; Refill: 1  Return for lab visit when able.  Micheline Rough, MD

## 2019-03-13 ENCOUNTER — Encounter: Payer: Self-pay | Admitting: Family Medicine

## 2019-03-17 ENCOUNTER — Other Ambulatory Visit: Payer: Self-pay

## 2019-03-17 ENCOUNTER — Other Ambulatory Visit (INDEPENDENT_AMBULATORY_CARE_PROVIDER_SITE_OTHER): Payer: Medicare Other

## 2019-03-17 DIAGNOSIS — G2581 Restless legs syndrome: Secondary | ICD-10-CM | POA: Diagnosis not present

## 2019-03-17 DIAGNOSIS — R739 Hyperglycemia, unspecified: Secondary | ICD-10-CM

## 2019-03-17 DIAGNOSIS — E785 Hyperlipidemia, unspecified: Secondary | ICD-10-CM

## 2019-03-17 LAB — CBC WITH DIFFERENTIAL/PLATELET
Basophils Absolute: 0.1 10*3/uL (ref 0.0–0.1)
Basophils Relative: 1.2 % (ref 0.0–3.0)
Eosinophils Absolute: 0.1 10*3/uL (ref 0.0–0.7)
Eosinophils Relative: 1.8 % (ref 0.0–5.0)
HCT: 44.2 % (ref 36.0–46.0)
Hemoglobin: 14.8 g/dL (ref 12.0–15.0)
Lymphocytes Relative: 26 % (ref 12.0–46.0)
Lymphs Abs: 1.7 10*3/uL (ref 0.7–4.0)
MCHC: 33.6 g/dL (ref 30.0–36.0)
MCV: 98.2 fl (ref 78.0–100.0)
Monocytes Absolute: 0.3 10*3/uL (ref 0.1–1.0)
Monocytes Relative: 4.9 % (ref 3.0–12.0)
Neutro Abs: 4.2 10*3/uL (ref 1.4–7.7)
Neutrophils Relative %: 66.1 % (ref 43.0–77.0)
Platelets: 250 10*3/uL (ref 150.0–400.0)
RBC: 4.5 Mil/uL (ref 3.87–5.11)
RDW: 13.1 % (ref 11.5–15.5)
WBC: 6.4 10*3/uL (ref 4.0–10.5)

## 2019-03-17 LAB — COMPREHENSIVE METABOLIC PANEL
ALT: 15 U/L (ref 0–35)
AST: 17 U/L (ref 0–37)
Albumin: 4.6 g/dL (ref 3.5–5.2)
Alkaline Phosphatase: 94 U/L (ref 39–117)
BUN: 15 mg/dL (ref 6–23)
CO2: 30 mEq/L (ref 19–32)
Calcium: 9.6 mg/dL (ref 8.4–10.5)
Chloride: 105 mEq/L (ref 96–112)
Creatinine, Ser: 0.88 mg/dL (ref 0.40–1.20)
GFR: 62.42 mL/min (ref >60.00)
Glucose, Bld: 105 mg/dL — ABNORMAL HIGH (ref 70–99)
Potassium: 4 mEq/L (ref 3.5–5.1)
Sodium: 141 mEq/L (ref 135–145)
Total Bilirubin: 0.6 mg/dL (ref 0.2–1.2)
Total Protein: 7.1 g/dL (ref 6.0–8.3)

## 2019-03-17 LAB — LIPID PANEL
Cholesterol: 195 mg/dL (ref 0–200)
HDL: 70 mg/dL (ref >39.00)
LDL Cholesterol: 101 mg/dL — ABNORMAL HIGH (ref 0–99)
NonHDL: 124.99
Total CHOL/HDL Ratio: 3
Triglycerides: 122 mg/dL (ref 0.0–149.0)
VLDL: 24.4 mg/dL (ref 0.0–40.0)

## 2019-03-17 LAB — HEMOGLOBIN A1C: Hgb A1c MFr Bld: 5.2 % (ref 4.6–6.5)

## 2019-03-20 ENCOUNTER — Ambulatory Visit: Payer: Medicare Other | Attending: Internal Medicine

## 2019-03-20 ENCOUNTER — Ambulatory Visit: Payer: Medicare Other

## 2019-03-20 DIAGNOSIS — Z23 Encounter for immunization: Secondary | ICD-10-CM | POA: Insufficient documentation

## 2019-03-20 NOTE — Progress Notes (Signed)
   Covid-19 Vaccination Clinic  Name:  Keondra Haydu    MRN: [id number] DOB: 1940-03-14  03/20/2019  Ms. Watanabe was observed post Covid-19 immunization for 15 minutes without incidence. She was provided with Vaccine Information Sheet and instruction to access the V-Safe system.   Ms. Prindle was instructed to call 911 with any severe reactions post vaccine: Marland Kitchen Difficulty breathing  . Swelling of your face and throat  . A fast heartbeat  . A bad rash all over your body  . Dizziness and weakness    Immunizations Administered    Name Date Dose VIS Date Route   Pfizer COVID-19 Vaccine 03/20/2019 11:14 AM 0.3 mL 02/05/2019 Intramuscular   Manufacturer: [employer], [employer]   Lot: [id number]   NDC: [address]-7

## 2019-03-27 DIAGNOSIS — Z20828 Contact with and (suspected) exposure to other viral communicable diseases: Secondary | ICD-10-CM | POA: Diagnosis not present

## 2019-04-09 ENCOUNTER — Ambulatory Visit: Payer: Self-pay

## 2019-04-10 ENCOUNTER — Ambulatory Visit: Payer: Medicare Other | Attending: Internal Medicine

## 2019-04-10 DIAGNOSIS — Z23 Encounter for immunization: Secondary | ICD-10-CM

## 2019-04-10 NOTE — Progress Notes (Signed)
   Covid-19 Vaccination Clinic  Name:  Christina Guerrero    MRN: [id number] DOB: 02/10/1941  04/10/2019  [name] was observed post Covid-19 immunization for 15 minutes without incidence. She was provided with Vaccine Information Sheet and instruction to access the V-Safe system.   [name] was instructed to call 911 with any severe reactions post vaccine: Marland Kitchen Difficulty breathing  . Swelling of your face and throat  . A fast heartbeat  . A bad rash all over your body  . Dizziness and weakness    Immunizations Administered    Name Date Dose VIS Date Route   Pfizer COVID-19 Vaccine 04/10/2019 10:17 AM 0.3 mL 02/05/2019 Intramuscular   Manufacturer: [employer], [employer]   Lot: [id number]   NDC: [address]-1

## 2019-04-17 ENCOUNTER — Other Ambulatory Visit: Payer: Self-pay | Admitting: Family Medicine

## 2019-04-19 ENCOUNTER — Other Ambulatory Visit: Payer: Self-pay

## 2019-04-19 ENCOUNTER — Encounter (HOSPITAL_COMMUNITY): Payer: Self-pay | Admitting: Psychiatry

## 2019-04-19 ENCOUNTER — Ambulatory Visit (INDEPENDENT_AMBULATORY_CARE_PROVIDER_SITE_OTHER): Payer: Medicare Other | Admitting: Psychiatry

## 2019-04-19 DIAGNOSIS — G2581 Restless legs syndrome: Secondary | ICD-10-CM

## 2019-04-19 DIAGNOSIS — F331 Major depressive disorder, recurrent, moderate: Secondary | ICD-10-CM | POA: Diagnosis not present

## 2019-04-19 MED ORDER — VENLAFAXINE HCL ER 150 MG PO CP24: 150.0000 mg | ORAL_CAPSULE | Freq: Every day | ORAL | 1 refills | Status: DC

## 2019-04-19 MED ORDER — ROPINIROLE HCL 2 MG PO TABS: 2.0000 mg | ORAL_TABLET | Freq: Every day | ORAL | 1 refills | Status: DC

## 2019-04-19 MED ORDER — ROPINIROLE HCL 0.5 MG PO TABS: 0.5000 mg | ORAL_TABLET | Freq: Every day | ORAL | 1 refills | Status: DC

## 2019-04-19 NOTE — Progress Notes (Signed)
Virtual Visit via Telephone Note  I connected with Christina Guerrero on 04/19/19 at  3:40 PM EST by telephone and verified that I am speaking with the correct person using two identifiers.   I discussed the limitations, risks, security and privacy concerns of performing an evaluation and management service by telephone and the availability of in person appointments. I also discussed with the patient that there may be a patient responsible charge related to this service. The patient expressed understanding and agreed to proceed.   History of Present Illness: Patient was evaluated by phone session.  She is compliant with Effexor and Requip.  Lately she had blood work because of incontinence and cramping in her abdomen.  Her blood work was normal.  Otherwise she reported things are going well.  Her husband continues to decline gradually and she is 24/7 taking care.  Sometimes she get help from her daughter and a lady who comes when she need some time for herself.  She like to get a refill of Requip from [place] since she has difficulty getting refills from her PCP.  Patient denies any crying spells, feeling of hopelessness or worthlessness.  She denies any suicidal thoughts.  Recently there was a power outage because of the strong and she went to her son's house.  Patient like to keep her current medication since it is working.  Past Psychiatric History:Reviewed. H/O depression started in 1989.  Saw Dr. Wendall Mola. Took klonopin caused thinning of hair and Wellbutrin did not help.  No h/o suicidal attempt, inpatient treatment, paranoia, hallucination, aggressive behavior, mania or psychosis.    Recent Results (from the past 2160 hour(s))  Hemoglobin A1c     Status: None   Collection Time: 03/17/19 10:24 AM  Result Value Ref Range   Hgb A1c MFr Bld 5.2 4.6 - 6.5 %    Comment: Glycemic Control Guidelines for People with Diabetes:Non Diabetic:  <6%Goal of Therapy: <7%Additional Action Suggested:  >8%    Lipid panel     Status: Abnormal   Collection Time: 03/17/19 10:24 AM  Result Value Ref Range   Cholesterol 195 0 - 200 mg/dL    Comment: ATP III Classification       Desirable:  < 200 mg/dL               Borderline High:  200 - 239 mg/dL          High:  > = 852 mg/dL   Triglycerides 778.2 0.0 - 149.0 mg/dL    Comment: Normal:  <423 mg/dLBorderline High:  150 - 199 mg/dL   HDL 53.61 >44.31 mg/dL   VLDL 54.0 0.0 - 08.6 mg/dL   LDL Cholesterol 761 (H) 0 - 99 mg/dL   Total CHOL/HDL Ratio 3     Comment:                Men          Women1/2 Average Risk     3.4          3.3Average Risk          5.0          4.42X Average Risk          9.6          7.13X Average Risk          15.0          11.0  NonHDL 124.99     Comment: NOTE:  Non-HDL goal should be 30 mg/dL higher than patient's LDL goal (i.e. LDL goal of < 70 mg/dL, would have non-HDL goal of < 100 mg/dL)  Comprehensive metabolic panel     Status: Abnormal   Collection Time: 03/17/19 10:24 AM  Result Value Ref Range   Sodium 141 135 - 145 mEq/L   Potassium 4.0 3.5 - 5.1 mEq/L   Chloride 105 96 - 112 mEq/L   CO2 30 19 - 32 mEq/L   Glucose, Bld 105 (H) 70 - 99 mg/dL   BUN 15 6 - 23 mg/dL   Creatinine, Ser 1.61 0.40 - 1.20 mg/dL   Total Bilirubin 0.6 0.2 - 1.2 mg/dL   Alkaline Phosphatase 94 39 - 117 U/L   AST 17 0 - 37 U/L   ALT 15 0 - 35 U/L   Total Protein 7.1 6.0 - 8.3 g/dL   Albumin 4.6 3.5 - 5.2 g/dL   GFR 09.60 >45.40 mL/min   Calcium 9.6 8.4 - 10.5 mg/dL  CBC with Differential/Platelet     Status: None   Collection Time: 03/17/19 10:24 AM  Result Value Ref Range   WBC 6.4 4.0 - 10.5 K/uL   RBC 4.50 3.87 - 5.11 Mil/uL   Hemoglobin 14.8 12.0 - 15.0 g/dL   HCT 98.1 19.1 - 47.8 %   MCV 98.2 78.0 - 100.0 fl   MCHC 33.6 30.0 - 36.0 g/dL   RDW 29.5 62.1 - 30.8 %   Platelets 250.0 150.0 - 400.0 K/uL   Neutrophils Relative % 66.1 43.0 - 77.0 %   Lymphocytes Relative 26.0 12.0 - 46.0 %   Monocytes Relative  4.9 3.0 - 12.0 %   Eosinophils Relative 1.8 0.0 - 5.0 %   Basophils Relative 1.2 0.0 - 3.0 %   Neutro Abs 4.2 1.4 - 7.7 K/uL   Lymphs Abs 1.7 0.7 - 4.0 K/uL   Monocytes Absolute 0.3 0.1 - 1.0 K/uL   Eosinophils Absolute 0.1 0.0 - 0.7 K/uL   Basophils Absolute 0.1 0.0 - 0.1 K/uL     Psychiatric Specialty Exam: Physical Exam  Review of Systems  There were no vitals taken for this visit.There is no height or weight on file to calculate BMI.  General Appearance: NA  Eye Contact:  NA  Speech:  Clear and Coherent and Normal Rate  Volume:  Normal  Mood:  Euthymic  Affect:  NA  Thought Process:  Goal Directed  Orientation:  Full (Time, Place, and Person)  Thought Content:  WDL  Suicidal Thoughts:  No  Homicidal Thoughts:  No  Memory:  Immediate;   Good Recent;   Good Remote;   Good  Judgement:  Intact  Insight:  Present  Psychomotor Activity:  NA  Concentration:  Concentration: Good and Attention Span: Good  Recall:  Good  Fund of Knowledge:  Good  Language:  Good  Akathisia:  No  Handed:  Right  AIMS (if indicated):     Assets:  Communication Skills Desire for Improvement Housing Resilience Social Support  ADL's:  Intact  Cognition:  WNL  Sleep:   ok      Assessment and Plan: Major depressive disorder, recurrent.  Restless leg syndrome.  Patient like to have Requip from our office.  I will continue Requip 2 mg every night and 0.5 mg as needed to those nights when she cannot sleep very well with Requip 2 mg.  Continue Effexor Exar 150 mg daily.  Recommended to call us back if she is any question of any concern.  I reviewed blood work results.  Follow-up in 6 months.  Follow Up Instructions:    I discussed the assessment and treatment plan with the patient. The patient was provided an opportunity to ask questions and all were answered. The patient agreed with the plan and demonstrated an understanding of the instructions.   The patient was advised to call back or  seek an in-person evaluation if the symptoms worsen or if the condition fails to improve as anticipated.  I provided 20 minutes of non-face-to-face time during this encounter.   Kathlee Nations, MD

## 2019-07-22 ENCOUNTER — Ambulatory Visit (INDEPENDENT_AMBULATORY_CARE_PROVIDER_SITE_OTHER): Payer: Medicare Other | Admitting: Psychology

## 2019-07-22 DIAGNOSIS — F339 Major depressive disorder, recurrent, unspecified: Secondary | ICD-10-CM | POA: Diagnosis not present

## 2019-07-23 ENCOUNTER — Ambulatory Visit: Payer: Medicare Other | Admitting: Family Medicine

## 2019-08-04 ENCOUNTER — Ambulatory Visit: Payer: Medicare Other | Admitting: Psychology

## 2019-08-09 ENCOUNTER — Ambulatory Visit (INDEPENDENT_AMBULATORY_CARE_PROVIDER_SITE_OTHER): Payer: Medicare Other | Admitting: Psychology

## 2019-08-09 DIAGNOSIS — F339 Major depressive disorder, recurrent, unspecified: Secondary | ICD-10-CM

## 2019-08-12 ENCOUNTER — Other Ambulatory Visit: Payer: Self-pay

## 2019-08-13 ENCOUNTER — Ambulatory Visit (INDEPENDENT_AMBULATORY_CARE_PROVIDER_SITE_OTHER): Payer: Medicare Other | Admitting: Family Medicine

## 2019-08-13 ENCOUNTER — Encounter: Payer: Self-pay | Admitting: Family Medicine

## 2019-08-13 VITALS — BP 140/80 | HR 69 | Temp 97.1°F | Ht 65.5 in | Wt 153.2 lb

## 2019-08-13 DIAGNOSIS — K219 Gastro-esophageal reflux disease without esophagitis: Secondary | ICD-10-CM | POA: Diagnosis not present

## 2019-08-13 DIAGNOSIS — F339 Major depressive disorder, recurrent, unspecified: Secondary | ICD-10-CM | POA: Diagnosis not present

## 2019-08-13 DIAGNOSIS — Z0000 Encounter for general adult medical examination without abnormal findings: Secondary | ICD-10-CM

## 2019-08-13 DIAGNOSIS — F419 Anxiety disorder, unspecified: Secondary | ICD-10-CM

## 2019-08-13 DIAGNOSIS — G2581 Restless legs syndrome: Secondary | ICD-10-CM | POA: Diagnosis not present

## 2019-08-13 MED ORDER — ROPINIROLE HCL 0.5 MG PO TABS: 0.5000 mg | ORAL_TABLET | Freq: Every day | ORAL | 1 refills | Status: DC

## 2019-08-13 MED ORDER — ROPINIROLE HCL 2 MG PO TABS: 2.0000 mg | ORAL_TABLET | Freq: Every day | ORAL | 1 refills | Status: DC

## 2019-08-13 MED ORDER — LORAZEPAM 0.5 MG PO TABS: 0.2500 mg | ORAL_TABLET | Freq: Two times a day (BID) | ORAL | 1 refills | Status: DC | PRN

## 2019-08-13 NOTE — Progress Notes (Signed)
Patient: Christina Guerrero, Female    DOB: 07/06/1940, 79 y.o.   MRN: [id number] Visit Date: 08/15/2019  Today's Provider: Theodis Shove, MD   Chief Complaint  Patient presents with   Annual Exam   Subjective:   Initial preventative physical exam  Christina Guerrero  is a 79 y.o. female who presents today for her annual wellness exam.  She feels well. She reports exercising never. Just can't get out of house by herself to walk. She reports she is sleeping poorly.  Following with psychiatry for major depressive disorder and also restless legs.  Stopped all medications because just wasn't sure they were helping. Still taking requip. Taking the 0.5 during the day if bothering her and then the 2mg  at bedtime.   "Eating herself from within with anxiety". Really having hard time with this and helping husband with dementia. Has been hard year being in house. Leaving tomorrow to go to [place]. Hard to turn off brain at night. Waking up at 4am to go to bathroom and can't calm down.   Incontinent; bm are 'weird' and thinks all is stress related.   Just started with therapist which is helpful. Has only had a couple of sessions.   Hyperlipidemia: Rosuvastatin  GERD: Omeprazole 20 mg daily.  HPI Living arrangements - the patient lives with their spouse.   Hearing Screening   125Hz  250Hz  500Hz  1000Hz  2000Hz  3000Hz  4000Hz  6000Hz  8000Hz   Right ear:           Left ear:             Visual Acuity Screening   Right eye Left eye Both eyes  Without correction: 20/20 20/16 20/20   With correction:       Advance Directives/Living Will: she does have this; not sure if we have on file  Review of Systems  Constitutional: Negative for chills, fatigue and fever.  Respiratory: Negative for cough, chest tightness, shortness of breath and wheezing.   Cardiovascular: Negative for chest pain, palpitations and leg swelling.  Psychiatric/Behavioral: Negative for suicidal ideas. The patient is  nervous/anxious (a lot of caregiver burden with husband having dementia).     Social History   Socioeconomic History   Marital status: Married    Spouse name: Not on file   Number of children: Not on file   Years of education: Not on file   Highest education level: Not on file  Occupational History   Not on file  Tobacco Use   Smoking status: Former Smoker   Smokeless tobacco: Never Used   Tobacco comment: QUIT SMOKING  IN 1989  Vaping Use   Vaping Use: Never used  Substance and Sexual Activity   Alcohol use: Yes    Alcohol/week: 2.0 standard drinks    Types: 2 Glasses of wine per week    Comment: glass of wine once per day   Drug use: No   Sexual activity: Not Currently  Other Topics Concern   Not on file  Social History Narrative   Work or School: retired Situation: living with spouse      Spiritual Beliefs: none      Lifestyle: no regular exercise; diet is so so            Social Determinants of Strain:    Difficulty of Paying Living Expenses:   Food Insecurity:    Worried About in the Last Year:  Ran Out of Food in the Last Year:   Transportation Needs:    Freight forwarder (Medical):    Lack of Transportation (Non-Medical):   Physical Activity:    Days of Exercise per Week:    Minutes of Exercise per Session:   Stress:    Feeling of Stress :   Social Connections:    Frequency of Communication with Friends and Family:    Frequency of Social Gatherings with Friends and Family:    Attends Religious Services:    Active Member of Clubs or Organizations:    Attends Banker Meetings:    Marital Status:   Intimate Partner Violence:    Fear of Current or Ex-Partner:    Emotionally Abused:    Physically Abused:    Sexually Abused:     Patient Active Problem List   Diagnosis Date Noted   Episode of recurrent major  depressive disorder (HCC) 06/24/2016   S/P laparoscopic appendectomy 03/27/2015   GERD (gastroesophageal reflux disease) 05/03/2013   Restless leg 05/03/2013    Past Surgical History:  Procedure Laterality Date   APPENDECTOMY  02/2015   LAPROSCOPIC    COLONOSCOPY  2012   LAPAROSCOPIC APPENDECTOMY N/A 03/27/2015   Procedure: APPENDECTOMY LAPAROSCOPIC;  Surgeon: Emelia Loron, MD;  Location: [facility] OR;  Service: General;  Laterality: N/A;   TONSILLECTOMY AND ADENOIDECTOMY  1954    Her family history includes Alcohol abuse in her father; Alcoholism in her father; Depression in her mother; Hepatitis in her father; Hyperlipidemia in her mother; Hypertension in her mother; Kidney disease in her father; Mental illness in her father and mother; Stroke in her maternal grandfather; Stroke (age of onset: 63) in her mother; Sudden death in her father; Thyroid disease in her mother.     Previous Medications   OMEPRAZOLE (PRILOSEC) 20 MG CAPSULE    TAKE 1 CAPSULE(20 MG) BY MOUTH DAILY   ROSUVASTATIN (CRESTOR) 20 MG TABLET    TAKE 1 TABLET BY MOUTH DAILY    Patient Care Team: Wynn Banker, MD as PCP - General (Family Medicine)      Objective:   Vitals: BP 140/80 (BP Location: Right Arm, Patient Position: Sitting, Cuff Size: Normal)    Pulse 69    Temp (!) 97.1 F (36.2 C) (Temporal)    Ht 5' 5.5" (1.664 m)    Wt 153 lb 3.2 oz (69.5 kg)    SpO2 97%    BMI 25.11 kg/m   Physical Exam Constitutional:      General: She is not in acute distress.    Appearance: She is well-developed.  Cardiovascular:     Rate and Rhythm: Normal rate and regular rhythm.     Heart sounds: Normal heart sounds. No murmur heard.  No friction rub.  Pulmonary:     Effort: Pulmonary effort is normal. No respiratory distress.     Breath sounds: Normal breath sounds. No wheezing or rales.  Musculoskeletal:     Right lower leg: No edema.     Left lower leg: No edema.  Neurological:     Mental Status: She  is alert and oriented to person, place, and time.  Psychiatric:        Behavior: Behavior normal.       Hearing Screening   125Hz  250Hz  500Hz  1000Hz  2000Hz  3000Hz  4000Hz  6000Hz  8000Hz   Right ear:           Left ear:  Visual Acuity Screening   Right eye Left eye Both eyes  Without correction: 20/20 20/16 20/20   With correction:       Activities of Daily Living In your present state of health, do you have any difficulty performing the following activities: 08/15/2019  Hearing? N  Vision? N  Difficulty concentrating or making decisions? N  Walking or climbing stairs? N  Dressing or bathing? N  Doing errands, shopping? N  Some recent data might be hidden    Fall Risk Assessment Fall Risk  08/13/2019 01/20/2019 01/13/2018 12/09/2017 11/29/2016  Falls in the past year? 0 0 0 No Yes  Comment - Emmi Telephone Survey: data to providers prior to load Emmi Telephone Survey: data to providers prior to load - -  Number falls in past yr: 0 - - - 1  Injury with Fall? 0 - - - Yes     Patient reports there are safety devices in place in shower at home (these are set up for husband - physically declining; cognitive has really gone).  Depression Screen PHQ 2/9 Scores 08/13/2019 03/13/2019 12/09/2017 06/03/2017  PHQ - 2 Score 3 4 6 5   PHQ- 9 Score 13 12 14 12     Cognitive Testing - 6-CIT   Correct? Score   What year is it? yes 0 Yes = 0    No = 4  What month is it? yes 0 Yes = 0    No = 3  Remember:     [place], [place], [place]     What time is it? yes 0 Yes = 0    No = 3  Count backwards from 20 to 1 yes 0 Correct = 0    1 error = 2   More than 1 error = 4  Say the months of the year in reverse. yes 0 Correct = 0    1 error = 2   More than 1 error = 4  What address did I ask you to remember? yes 0 Correct = 0  1 error = 2    2 error = 4    3 error = 6    4 error = 8    All wrong = 10       TOTAL SCORE  0/28   Interpretation:  Normal  Normal (0-7) Abnormal (8-28)     Health Maintenance  Topic Date Due   Hepatitis C Screening  Never done   TETANUS/TDAP  03/17/2019   DEXA SCAN  12/22/2024 (Originally 11/07/2007)   INFLUENZA VACCINE  09/26/2019   COVID-19 Vaccine  Completed   PNA vac Low Risk Adult  Completed    Assessment & Plan:     Initial Preventative Physical Exam  Reviewed patient's Family Medical History Reviewed and updated list of patient's medical providers Assessment of cognitive impairment was done Assessed patient's functional ability Established a written schedule for health screening [place] Completed and Reviewed  Exercise Activities and Dietary recommendations Goals     Patient Stated     Continue in care group group !  Discussed helping spouse adjust to help in the home slowly        Immunization History  Administered Date(s) Administered   Fluad Quad(high Dose 65+) 01/04/2019   Influenza, High Dose Seasonal PF 12/23/2014, 10/31/2015, 11/29/2016, 12/09/2017   Influenza-Unspecified 01/05/2013   PFIZER SARS-COV-2 Vaccination 03/20/2019, 04/10/2019   Pneumococcal Conjugate-13 12/23/2014   Pneumococcal Polysaccharide-23 03/16/2009   Tdap 03/16/2009  Health Maintenance  Topic Date Due   Hepatitis C Screening  Never done   TETANUS/TDAP  03/17/2019   DEXA SCAN  12/22/2024 (Originally 11/07/2007)   INFLUENZA VACCINE  09/26/2019   COVID-19 Vaccine  Completed   PNA vac Low Risk Adult  Completed     Discussed health benefits of physical activity, and encouraged her to engage in regular exercise appropriate for her age and condition.    ------------------------------------------------------------------------------------------------------------  1. Encounter for Medicare annual wellness exam Encouraged her to consider shingles and Tdap vaccines at the pharmacy.  We discussed importance of daily exercise to help maintain good health and to help with stress reduction.  2.  Gastroesophageal reflux disease, unspecified whether esophagitis present Continue with omeprazole.  She has been unable to wean off of this in the past.  3. Episode of recurrent major depressive disorder, unspecified depression episode severity (HCC) Mood has been stable for her medication.  She does have increased anxiety.  We discussed Ativan as needed.  She understands risks of this medication, but she is not doing well with taking medication on a daily basis and feels that occasional use of medication for more severe moments of anxiety will be helpful.  4. Restless legs She does well with the Requip.  Takes 2 mg at bedtime and half during the day just if needed. - rOPINIRole (REQUIP) 0.5 MG tablet; Take 1 tablet (0.5 mg total) by mouth at bedtime.  Dispense: 90 tablet; Refill: 1 - rOPINIRole (REQUIP) 2 MG tablet; Take 1 tablet (2 mg total) by mouth at bedtime. Appointment required for further refills.  Dispense: 90 tablet; Refill: 1  7. Anxiety See above.  If she ends up needing on a more regular basis, we will need to discuss additional medication to help with anxiety. - LORazepam (ATIVAN) 0.5 MG tablet; Take 0.5-1 tablets (0.25-0.5 mg total) by mouth 2 (two) times daily as needed for anxiety or sleep.  Dispense: 30 tablet; Refill: 1  Return in about 6 months (around 02/12/2020) for Chronic condition visit. Theodis Shove, MD

## 2019-08-13 NOTE — Patient Instructions (Addendum)
Consider shingrix vaccine at pharmacy  Try to make time for exercise daily.

## 2019-08-23 ENCOUNTER — Ambulatory Visit: Payer: Medicare Other | Admitting: Psychology

## 2019-08-27 ENCOUNTER — Encounter: Payer: Self-pay | Admitting: Family Medicine

## 2019-08-31 ENCOUNTER — Encounter: Payer: Self-pay | Admitting: Family Medicine

## 2019-08-31 ENCOUNTER — Telehealth (INDEPENDENT_AMBULATORY_CARE_PROVIDER_SITE_OTHER): Payer: Medicare Other | Admitting: Family Medicine

## 2019-08-31 DIAGNOSIS — M25552 Pain in left hip: Secondary | ICD-10-CM

## 2019-08-31 DIAGNOSIS — M25559 Pain in unspecified hip: Secondary | ICD-10-CM

## 2019-08-31 NOTE — Progress Notes (Signed)
Virtual Visit via Video Note  I connected with Christina Guerrero  on 08/31/19 at  3:40 PM EDT by a video enabled telemedicine application and verified that I am speaking with the correct person using two identifiers.  Location patient: home, [place] Location provider:work or home office Persons participating in the virtual visit: patient, provider  I discussed the limitations of evaluation and management by telemedicine and the availability of in person appointments. The patient expressed understanding and agreed to proceed.   HPI:  Acute visit for hip issues: -started a few months ago - worsened with sleeping on an air mattress -symptoms: L hip pain, lateral hip, worse with lying on this side and with stairs and walking -denies weakness, numbness, radiation, inability to bear weight, malaise, known injury   ROS: See pertinent positives and negatives per HPI.  Past Medical History:  Diagnosis Date  . Arthritis   . Chicken pox   . Depression   . GERD (gastroesophageal reflux disease)   . High cholesterol   . Restless leg syndrome   . Sleep apnea    borderline  . Urine incontinence   . UTI (urinary tract infection)     Past Surgical History:  Procedure Laterality Date  . APPENDECTOMY  02/2015   LAPROSCOPIC   . COLONOSCOPY  2012  . LAPAROSCOPIC APPENDECTOMY N/A 03/27/2015   Procedure: APPENDECTOMY LAPAROSCOPIC;  Surgeon: Emelia Loron, MD;  Location: [facility] OR;  Service: General;  Laterality: N/A;  . TONSILLECTOMY AND ADENOIDECTOMY  1954    Family History  Problem Relation Age of Onset  . Hyperlipidemia Mother   . Hypertension Mother   . Mental illness Mother   . Stroke Mother 69  . Thyroid disease Mother   . Depression Mother   . Kidney disease Father   . Mental illness Father   . Alcoholism Father   . Hepatitis Father   . Sudden death Father   . Alcohol abuse Father   . Stroke Maternal Grandfather     SOCIAL HX: see hpi   Current Outpatient Medications:  .  NAPROXEN  PO, Take by mouth., Disp: , Rfl:  .  omeprazole (PRILOSEC) 20 MG capsule, TAKE 1 CAPSULE(20 MG) BY MOUTH DAILY, Disp: 90 capsule, Rfl: 1 .  rOPINIRole (REQUIP) 0.5 MG tablet, Take 1 tablet (0.5 mg total) by mouth at bedtime., Disp: 90 tablet, Rfl: 1 .  rOPINIRole (REQUIP) 2 MG tablet, Take 1 tablet (2 mg total) by mouth at bedtime. Appointment required for further refills., Disp: 90 tablet, Rfl: 1 .  rosuvastatin (CRESTOR) 20 MG tablet, TAKE 1 TABLET BY MOUTH DAILY, Disp: 90 tablet, Rfl: 1  EXAM:  VITALS per patient if applicable:  GENERAL: alert, oriented, appears well and in no acute distress  HEENT: atraumatic, conjunttiva clear, no obvious abnormalities on inspection of external nose and ears  NECK: normal movements of the head and neck  LUNGS: on inspection no signs of respiratory distress, breathing rate appears normal, no obvious gross SOB, gasping or wheezing  CV: no obvious cyanosis  MS: moves all visible extremities without noticeable abnormality, seems to get out of chair ok, points to lateral L hip over area of greater trochanter as area of concern and admits to TTP on pt self exam here  PSYCH/NEURO: pleasant and cooperative, no obvious depression or anxiety, speech and thought processing grossly intact  ASSESSMENT AND PLAN:  Discussed the following assessment and plan:  Left hip pain - Plan: Ambulatory referral to Sports Medicine  Lateral pain of hip -  Plan: Ambulatory referral to Sports Medicine  -we discussed possible serious and likely etiologies, options for evaluation and workup, limitations of telemedicine visit vs in person visit, treatment, treatment risks and precautions. Pt prefers to treat via telemedicine empirically rather then risking or undertaking an in person visit at this moment. Query greater trochanteric bursitis vs other. Advised ice 15-20 minutes 2-3 times daily, prn naproxen in regular dosing , avoidance of aggravating factors and referral to sports  medicine to r/o other and treat further with PT and or inj if needed. Sent referral. Pt agrees to call pcp office if does not hear about the referral this week. Patient agrees to seek prompt in person care if worsening, new symptoms arise, or if is not improving with treatment.   I discussed the assessment and treatment plan with the patient. The patient was provided an opportunity to ask questions and all were answered. The patient agreed with the plan and demonstrated an understanding of the instructions.   The patient was advised to call back or seek an in-person evaluation if the symptoms worsen or if the condition fails to improve as anticipated.   Christina Koyanagi, DO

## 2019-08-31 NOTE — Telephone Encounter (Signed)
Spoke with the pt and scheduled an appt for today at 3:40pm.

## 2019-09-03 ENCOUNTER — Ambulatory Visit: Payer: Medicare Other | Admitting: Psychology

## 2019-09-03 ENCOUNTER — Ambulatory Visit (INDEPENDENT_AMBULATORY_CARE_PROVIDER_SITE_OTHER): Payer: Medicare Other | Admitting: Family Medicine

## 2019-09-03 ENCOUNTER — Encounter: Payer: Self-pay | Admitting: Family Medicine

## 2019-09-03 ENCOUNTER — Other Ambulatory Visit: Payer: Self-pay

## 2019-09-03 VITALS — BP 136/72 | HR 58 | Ht 65.5 in | Wt 155.0 lb

## 2019-09-03 DIAGNOSIS — M7061 Trochanteric bursitis, right hip: Secondary | ICD-10-CM | POA: Diagnosis not present

## 2019-09-03 NOTE — Patient Instructions (Addendum)
Thank you for coming in today. Plan for Pt and home exercises.  Ok to try over the counter voltaren gel up to 4x daily  Start the home exercises we teach you today.   Recheck with me in 4-6 weeks especially if not better.

## 2019-09-03 NOTE — Progress Notes (Signed)
° ° °  Subjective:   I Christina Guerrero am serving as a Neurosurgeon for Dr. [name]  Patient is being seen for left hip pain. Patient states her pain is chronic. Posterior lateral pain. States she was told that it was bursitis. Pain radiates down the leg. Patient states her pain is sharp, dull, achy, sore and throbbing. Patient has tried ice and aleve for treatment. The pain is consistent all day and night. 5/10 at its worse. Getting out of the car and stairs makes her pain worse.    Pertinent review of Systems: No fevers or chills  Relevant historical information: Depression history of sleep apnea history appendectomy 2017   Objective:    Vitals:   09/03/19 0915  BP: 136/72  Pulse: (!) 58  SpO2: 98%   General: Well Developed, well nourished, and in no acute distress.   MSK: Left hip normal-appearing normal motion Mildly tender palpation greater trochanter. Hip abduction strength diminished 4/5. External rotation strength diminished 4/5 with pain. Mild antalgic gait.  Lab and Radiology Results  X-ray images from CT scan abdomen pelvis 2017 showed mild right hip DJD normal left hip Mages personally and independently reviewed   Impression and Recommendations:    Assessment and Plan: 79 y.o. female with left hip pain due to trochanteric bursitis or hip abductor/rotator tendinitis. Plan refer to physical therapy and trial of gabapentin. Home exercises taught in clinic today by ATC. Recheck back 4 to 6 weeks if not better..   Orders Placed This Encounter  Procedures   Ambulatory referral to Physical Therapy    Referral Priority:   Routine    Referral Type:   Physical Medicine    Referral Reason:   Specialty Services Required    Requested Specialty:   Physical Therapy   No orders of the defined types were placed in this encounter.   Discussed warning signs or symptoms. Please see discharge instructions. Patient expresses understanding.   The above documentation has been  reviewed and is accurate and complete [name], M.D.

## 2019-09-13 ENCOUNTER — Ambulatory Visit: Payer: Medicare Other | Admitting: Psychology

## 2019-10-04 ENCOUNTER — Other Ambulatory Visit: Payer: Self-pay

## 2019-10-05 ENCOUNTER — Other Ambulatory Visit: Payer: Self-pay

## 2019-10-05 ENCOUNTER — Ambulatory Visit (INDEPENDENT_AMBULATORY_CARE_PROVIDER_SITE_OTHER): Payer: Medicare Other | Admitting: Family Medicine

## 2019-10-05 ENCOUNTER — Encounter: Payer: Self-pay | Admitting: Family Medicine

## 2019-10-05 VITALS — BP 132/62 | HR 65 | Wt 160.2 lb

## 2019-10-05 DIAGNOSIS — R21 Rash and other nonspecific skin eruption: Secondary | ICD-10-CM

## 2019-10-05 MED ORDER — TRIAMCINOLONE ACETONIDE 0.1 % EX CREA
1.0000 | TOPICAL_CREAM | Freq: Two times a day (BID) | CUTANEOUS | 0 refills | Status: DC
Start: 2019-10-05 — End: 2020-12-11

## 2019-10-05 NOTE — Progress Notes (Signed)
   Subjective:    Patient ID: Christina Guerrero, female    DOB: 02/13/1941, 79 y.o.   MRN: [id number]  HPI Here for 2 weeks of an itchy rash on the face and the anterior neck. She has never had this before. She feels fine in general. No recent changes in medications or makeup, etc. She took a Zyrtec once and she applied OTC cortisone cream once, and each of these helped a little.    Review of Systems  Constitutional: Negative.   Respiratory: Negative.   Cardiovascular: Negative.   Skin: Positive for rash.       Objective:   Physical Exam Constitutional:      General: She is not in acute distress.    Appearance: Normal appearance.  Cardiovascular:     Rate and Rhythm: Normal rate and regular rhythm.     Pulses: Normal pulses.     Heart sounds: Normal heart sounds.  Pulmonary:     Effort: Pulmonary effort is normal.     Breath sounds: Normal breath sounds.  Skin:    Comments: There is a faint erythematous macular rash with peeling over the forehead, cheeks, and anterior neck.   Neurological:     Mental Status: She is alert.           Assessment & Plan:  Rash, likely allergic in nature. Sun exposure may have played a role as well. She will apply Triamcinolone cream BID until this clears.  Gershon Crane, MD

## 2019-10-08 ENCOUNTER — Ambulatory Visit: Payer: Medicare Other | Admitting: Family Medicine

## 2019-10-08 ENCOUNTER — Ambulatory Visit (INDEPENDENT_AMBULATORY_CARE_PROVIDER_SITE_OTHER): Payer: Medicare Other | Admitting: Family Medicine

## 2019-10-08 DIAGNOSIS — Z5329 Procedure and treatment not carried out because of patient's decision for other reasons: Secondary | ICD-10-CM

## 2019-10-08 NOTE — Progress Notes (Signed)
NO SHOW

## 2019-10-12 ENCOUNTER — Other Ambulatory Visit: Payer: Self-pay

## 2019-10-12 ENCOUNTER — Ambulatory Visit (INDEPENDENT_AMBULATORY_CARE_PROVIDER_SITE_OTHER): Payer: Medicare Other | Admitting: Psychiatry

## 2019-10-12 ENCOUNTER — Encounter (HOSPITAL_COMMUNITY): Payer: Self-pay | Admitting: Psychiatry

## 2019-10-12 DIAGNOSIS — G2581 Restless legs syndrome: Secondary | ICD-10-CM

## 2019-10-12 NOTE — Progress Notes (Signed)
Virtual Visit via Telephone Note  I connected with Christina Guerrero on 10/12/19 at  4:00 PM EDT by telephone and verified that I am speaking with the correct person using two identifiers.  Location: Patient: home Provider: home office   I discussed the limitations, risks, security and privacy concerns of performing an evaluation and management service by telephone and the availability of in person appointments. I also discussed with the patient that there may be a patient responsible charge related to this service. The patient expressed understanding and agreed to proceed.   History of Present Illness: Patient was evaluated by phone session.  She had stopped taking Effexor because she felt it is not helping her and she reported her depression is situational.  Her husband continues to get worse and her Parkinson and dementia is worsening.  She do get 3 morning home health aide but now since COVID cases are increasing she has no way to go anywhere.  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  Now she is considering memory care/nursing home for her husband.  Though it is a difficult decision but she feels that she is not helping her husband.  She admitted some time husband is combative but nothing she can do.  She is taking Requip which was recently filled by her PCP.  She like to stay off from Effexor for a while but promised that give [place] a call if she feels worsening of symptoms.  Her energy level is fair.  Her appetite is okay.  She denies any crying spells or any suicidal thoughts.   Past Psychiatric History:Reviewed. H/Odepression started in 1989. Saw Dr. Wendall Mola. Took klonopincaused thinning of hair and Wellbutrin did not help. No h/o suicidal attempt, inpatient treatment, paranoia, hallucination, aggressive behavior, mania or psychosis.    Psychiatric Specialty Exam: Physical Exam  Review of Systems  Weight 160 lb (72.6 kg).There  is no height or weight on file to calculate BMI.  General Appearance: NA  Eye Contact:  NA  Speech:  Slow  Volume:  Normal  Mood:  Dysphoric  Affect:  NA  Thought Process:  Goal Directed  Orientation:  Full (Time, Place, and Person)  Thought Content:  Rumination  Suicidal Thoughts:  No  Homicidal Thoughts:  No  Memory:  Immediate;   Good Recent;   Good Remote;   Good  Judgement:  Intact  Insight:  Present  Psychomotor Activity:  NA  Concentration:  Concentration: Good and Attention Span: Good  Recall:  Good  Fund of Knowledge:  Good  Language:  Good  Akathisia:  No  Handed:  Right  AIMS (if indicated):     Assets:  Communication Skills Housing Resilience Social Support  ADL's:  Intact  Cognition:  WNL  Sleep:         Assessment and Plan: Major depressive disorder, recurrent.  Restless leg syndrome.  Patient is not taking Effexor and like to keep her off from antidepressant for now.  She is considering moving her husband to facility whose dementia is getting worse.  I agree with the plan.  We will not prescribe Effexor.  However I did mention if she decided then we can discuss either restarting Effexor or try a different medication.  She agreed with the plan. we will not schedule appointment at this time until we receive a phone call from her.  Discussed 50 concern that anytime having active suicidal thoughts or homicidal [place] to call 911  with a local incident.  She was given Requip from her primary care physician.  Follow Up Instructions:    I discussed the assessment and treatment plan with the patient. The patient was provided an opportunity to ask questions and all were answered. The patient agreed with the plan and demonstrated an understanding of the instructions.   The patient was advised to call back or seek an in-person evaluation if the symptoms worsen or if the condition fails to improve as anticipated.  I provided 20 minutes of non-face-to-face time  during this encounter.   Cleotis Nipper, MD

## 2019-11-06 ENCOUNTER — Other Ambulatory Visit: Payer: Self-pay | Admitting: Family Medicine

## 2019-11-15 ENCOUNTER — Other Ambulatory Visit: Payer: Self-pay

## 2019-11-15 ENCOUNTER — Encounter: Payer: Self-pay | Admitting: Family Medicine

## 2019-11-15 ENCOUNTER — Ambulatory Visit: Payer: Self-pay

## 2019-11-15 ENCOUNTER — Ambulatory Visit (INDEPENDENT_AMBULATORY_CARE_PROVIDER_SITE_OTHER): Payer: Medicare Other | Admitting: Family Medicine

## 2019-11-15 VITALS — BP 148/80 | HR 56 | Ht 65.5 in | Wt 161.0 lb

## 2019-11-15 DIAGNOSIS — M25552 Pain in left hip: Secondary | ICD-10-CM

## 2019-11-15 NOTE — Progress Notes (Signed)
I, Christoper Fabian, LAT, ATC, am serving as scribe for Dr. [name].  Christina Guerrero is a 79 y.o. female who presents to [facility] today for f/u of L hip pain.  She was last seen by Dr. Denyse Amass on 09/03/19 and was shown a HEP and referred to PT but never attended PT.  Since her last visit, pt reports that her L hip pain has not changed since her last.  She reports some radiating pain from her L lateral hip into her lateral mid-thigh.  She was unable to do physical therapy because the time she was caring for her husband who is suffering from significant dementia.  She recently placed him in a nursing home and now has the ability to attend to physical therapy for herself if needed.  Diagnostic testing: L hip XR- 05/07/13   Pertinent review of systems: No fevers or chills  Relevant historical information: History depression   Exam:  BP (!) 148/80 (BP Location: Right Arm, Patient Position: Sitting, Cuff Size: Normal)    Pulse (!) 56    Ht 5' 5.5" (1.664 m)    Wt 161 lb (73 kg)    SpO2 97%    BMI 26.38 kg/m  General: Well Developed, well nourished, and in no acute distress.   MSK: Left hip normal-appearing normal motion.  Tender palpation greater trochanter. Hip abduction strength diminished 4/5 with pain.  External rotation strength is intact.    Lab and Radiology Results  Procedure: Real-time Ultrasound Guided Injection of left greater trochanter bursa Device: Philips Affiniti 50G Images permanently stored and available for review in PACS Verbal informed consent obtained.  Discussed risks and benefits of procedure. Warned about infection bleeding damage to structures skin hypopigmentation and fat atrophy among others. Patient expresses understanding and agreement Time-out conducted.   Noted no overlying erythema, induration, or other signs of local infection.   Skin prepped in a sterile fashion.   Local anesthesia: Topical Ethyl chloride.   With  sterile technique and under real time ultrasound guidance:  40 mg of Kenalog and 2 mL of Marcaine injected into lateral greater trochanter bursa near insertion of gluteus medius tendon. Fluid seen entering the bursa sac.   Completed without difficulty   Pain immediately resolved suggesting accurate placement of the medication.   Advised to call if fevers/chills, erythema, induration, drainage, or persistent bleeding.   Images permanently stored and available for review in the ultrasound unit.  Impression: Technically successful ultrasound guided injection.     Assessment and Plan: 79 y.o. female with left lateral hip pain due to trochanteric bursitis.  Not improving with relative rest and some home exercise program.  Plan for injection today.  Fortunately she now is able to do physical therapy and have referred her to physical therapy.  Recheck back in 6 weeks.  Return sooner if needed.  Precautions reviewed. This is now a chronic issue with exacerbation.   Orders Placed This Encounter  Procedures   [place] LIMITED JOINT SPACE STRUCTURES LOW LEFT(NO LINKED CHARGES)    Order Specific Question:   Reason for Exam (SYMPTOM  OR DIAGNOSIS REQUIRED)    Answer:   L hip pain    Order Specific Question:   Preferred imaging location?    Answer:   [facility] referral to Physical Therapy    Referral Priority:   Routine    Referral Type:   Physical Medicine    Referral Reason:  Specialty Services Required    Requested Specialty:   Physical Therapy   No orders of the defined types were placed in this encounter.    Discussed warning signs or symptoms. Please see discharge instructions. Patient expresses understanding.   The above documentation has been reviewed and is accurate and complete [name], M.D.

## 2019-11-15 NOTE — Patient Instructions (Addendum)
You had a L hip injection today.  Call or go to the ER if you develop a large red swollen joint with extreme pain or oozing puss.   Plan for PT.  Ok to continue voltren gel up to 4x daily.   Recheck in 6 weeks or so.

## 2019-11-25 ENCOUNTER — Other Ambulatory Visit: Payer: Self-pay

## 2019-11-25 ENCOUNTER — Encounter: Payer: Self-pay | Admitting: Physical Therapy

## 2019-11-25 ENCOUNTER — Ambulatory Visit (INDEPENDENT_AMBULATORY_CARE_PROVIDER_SITE_OTHER): Payer: Medicare Other | Admitting: Physical Therapy

## 2019-11-25 DIAGNOSIS — R29898 Other symptoms and signs involving the musculoskeletal system: Secondary | ICD-10-CM

## 2019-11-25 DIAGNOSIS — M6281 Muscle weakness (generalized): Secondary | ICD-10-CM

## 2019-11-25 DIAGNOSIS — M25552 Pain in left hip: Secondary | ICD-10-CM | POA: Diagnosis not present

## 2019-11-25 NOTE — Therapy (Signed)
[facility] [address] [place], [place], [address] Phone: [phone]   Fax:  [phone]  Physical Therapy Evaluation  Patient Details  Name: Christina Guerrero MRN: [id number] Date of Birth: [birth year] Referring Provider (PT): Rodolph Bong, MD   Encounter Date: 11/25/2019   PT End of Session - 11/25/19 1541    Visit Number 1    Number of Visits 12    Date for PT Re-Evaluation 01/06/20    Authorization Type Medicare/BCBS    Progress Note Due on Visit 10    PT Start Time 1346    PT Stop Time 1428    PT Time Calculation (min) 42 min    Activity Tolerance Patient tolerated treatment well    Behavior During Therapy [facility] for tasks assessed/performed           Past Medical History:  Diagnosis Date  . Arthritis   . Chicken pox   . Depression   . GERD (gastroesophageal reflux disease)   . High cholesterol   . Restless leg syndrome   . Sleep apnea    borderline  . Urine incontinence   . UTI (urinary tract infection)     Past Surgical History:  Procedure Laterality Date  . APPENDECTOMY  02/2015   LAPROSCOPIC   . COLONOSCOPY  2012  . LAPAROSCOPIC APPENDECTOMY N/A 03/27/2015   Procedure: APPENDECTOMY LAPAROSCOPIC;  Surgeon: Emelia Loron, MD;  Location: [facility] OR;  Service: General;  Laterality: N/A;  . TONSILLECTOMY AND ADENOIDECTOMY  1954    There were no vitals filed for this visit.    Subjective Assessment - 11/25/19 1351    Subjective Pt is a 79 y/o female who presents to OPPT for Lt hip pain x 4 months without known injury.  Pt had cortisone injection at last visit and had great relief initially but feels symptoms have started to return.  Pt reports difficulty walking and with sleeping on Lt side.    Pertinent History arthritis, depression, sleep apnea    Limitations Walking    How long can you walk comfortably? "I just don't walk."    Patient Stated Goals improve mobility, walk with less pain    Currently in Pain? Yes     Pain Score 2    up to 7/10; at best 2/10   Pain Location Hip    Pain Orientation Left    Pain Descriptors / Indicators Aching;Sharp;Sore;Dull    Pain Type Acute pain    Pain Radiating Towards down Lt thigh, also has some mild Lt LBP    Pain Onset More than a month ago    Pain Frequency Constant    Aggravating Factors  walking, sleeping on Lt side    Pain Relieving Factors cortisone injections, ice              [facility] PT Assessment - 11/25/19 1358      Assessment   Medical Diagnosis M25.552 (ICD-10-CM) - Lateral pain of left hip    Referring Provider (PT) Rodolph Bong, MD    Onset Date/Surgical Date --   4 months ago   Next MD Visit 12/27/19    Prior Therapy years ago - unrelated      Precautions   Precautions None      Restrictions   Weight Bearing Restrictions No      Balance Screen   Has the patient fallen in the past 6 months No    Has the patient had a decrease in activity level because of  a fear of falling?  No    Is the patient reluctant to leave their home because of a fear of falling?  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 - recently placed in Nursing Home      Prior Function   Level of Independence Independent    Vocation Retired    Heritage manager, reading      Cognition   Overall Cognitive Status Within Functional Limits for tasks assessed      ROM / Strength   AROM / PROM / Strength AROM;Strength      AROM   Overall AROM  Within functional limits for tasks performed      Strength   Strength Assessment Site Hip    Right/Left Hip Right;Left    Right Hip Flexion 4/5    Right Hip Extension 4/5    Right Hip External Rotation  4/5    Right Hip Internal  Rotation 4/5    Right Hip ABduction 4/5    Left Hip Flexion 3+/5    Left Hip Extension 3/5    Left Hip External Rotation 3/5    Left Hip Internal Rotation 4-/5    Left Hip ABduction 3/5      Flexibility   Soft Tissue Assessment /Muscle Length yes    Piriformis mild tightness on Lt      Palpation   Palpation comment trigger points in glute min and glute med on Lt - reproduction of symptoms with glute min trigger point      Special Tests    Special Tests Hip Special Tests    Hip Special Tests  Luisa Hart (FABER) Test;Hip Scouring      Luisa Hart (FABER) Test   Findings Negative    Comments mild symptoms on Lt      Hip Scouring   Findings Negative      Ambulation/Gait   Gait Comments amb independently without significant deviations                      Objective measurements completed on examination: See above findings.       [facility] Adult PT Treatment/Exercise - 11/25/19 1358      Exercises   Exercises Other Exercises    Other Exercises  see pt instructions - pt performed 1-2 reps of each exercise with min cues for technique      Manual Therapy   Manual therapy comments instructed in use of tennis ball for STM; discussed DN                  PT Education - 11/25/19 1541    Education Details HEP, DN    Person(s) Educated Patient    Methods Explanation;Demonstration;Handout    Comprehension Verbalized understanding;Returned demonstration;Need further instruction            PT Short Term Goals - 11/25/19 1547      PT SHORT TERM GOAL #1   Title independent with initial HEP    Status New    Target Date 12/16/19             PT Long  Term Goals - 11/25/19 1547      PT LONG TERM GOAL #1   Title independent with final HEP    Status New    Target Date 01/06/20      PT LONG TERM GOAL #2   Title demonstrate at least 4/4 Lt hip strength for improved function    Status New    Target Date 01/06/20      PT LONG TERM GOAL #3   Title report  ability to walk > 10 min with pain < 4/10 for improved function and mobility    Status New    Target Date 01/06/20      PT LONG TERM GOAL #4   Title report 75% improvement in sleep for improved pain and function    Status New    Target Date 01/06/20                  Plan - 11/25/19 1542    Clinical Impression Statement Pt is a 79 y/o female who presents to OPPT for Lt hip pain x 4 months.  She demonstrates decreased strength and active trigger points with decreased flexibility.  She will benefit from PT to address deficits listed.  Pt reports increased stress and situational depression at home which has limited her motivation for exercise and activity.  Feel if pt able to begin to work out of the cycle she will do well.  Discussed walking 5 min twice a day and gradually increasing activity as well as reconnecting with therapist for her depression.    Personal Factors and Comorbidities Social Background;Comorbidity 2;Fitness    Comorbidities arthritis, depression    Examination-Activity Limitations Sleep;Locomotion Level;Stairs;Stand    Examination-Participation Restrictions Cleaning;Community Activity;Yard Work    Conservation officer, historic buildings Evolving/Moderate complexity    Clinical Decision Making Moderate    Rehab Potential Good    PT Frequency 2x / week   1-2x/wk   PT Duration 6 weeks    PT Treatment/Interventions ADLs/Self Care Home Management;Cryotherapy;Electrical Stimulation;Iontophoresis 4mg /ml Dexamethasone;Moist Heat;Therapeutic exercise;Therapeutic activities;Functional mobility training;Stair training;Gait training;Ultrasound;Neuromuscular re-education;Patient/family education;Manual techniques;Taping;Dry needling    PT Next Visit Plan review HEP, DN to glutes, progress hip strength/general exercise    PT Home Exercise Plan Access Code:    Consulted and Agree with Plan of Care Patient           Patient will benefit from skilled therapeutic  intervention in order to improve the following deficits and impairments:  Increased fascial restricitons, Increased muscle spasms, Pain, Impaired flexibility, Decreased mobility, Decreased strength, Decreased activity tolerance, Decreased endurance  Visit Diagnosis: Pain in left hip - Plan: PT plan of care cert/re-cert  Other symptoms and signs involving the musculoskeletal system - Plan: PT plan of care cert/re-cert  Muscle weakness (generalized) - Plan: PT plan of care cert/re-cert     Problem List Patient Active Problem List   Diagnosis Date Noted  . Episode of recurrent major depressive disorder (HCC) 06/24/2016  . S/P laparoscopic appendectomy 03/27/2015  . GERD (gastroesophageal reflux disease) 05/03/2013  . Restless leg 05/03/2013      07/03/2013, PT, DPT 11/25/19 3:50 PM     [facility] [address] [place], [place], [place] Phone: [phone]   Fax:  [phone]  Name: Sheryl Saintil MRN: Harmon Dun Date of Birth: Jun 06, 1940

## 2019-11-25 NOTE — Patient Instructions (Signed)
Access Code: [email or web address] URL: [email or web address] Date: 11/25/2019 Prepared by: Moshe Cipro  Exercises Supine Piriformis Stretch with Foot on Ground - 2 x daily - 7 x weekly - 3 reps - 1 sets - 30 sec hold Sidelying Hip Abduction - 2 x daily - 7 x weekly - 1 sets - 10 reps Clamshell with Resistance - 2 x daily - 7 x weekly - 1 sets - 10 reps Supine Bridge - 2 x daily - 7 x weekly - 10 reps - 1 sets - 5 sec hold Standing Glute Med Mobilization with Small Ball on Wall - 3-4 x daily - 7 x weekly - 1 sets - 1-2 reps - 2-5 min hold  Patient Education Trigger Point Dry Needling

## 2019-11-29 ENCOUNTER — Telehealth: Payer: Self-pay | Admitting: Physical Therapy

## 2019-11-29 ENCOUNTER — Encounter: Payer: Medicare Other | Admitting: Physical Therapy

## 2019-11-29 NOTE — Telephone Encounter (Signed)
Spoke to pt who stated she forgot about appt today.  Reminded of next scheduled appt and to call if she needed to cancel.  Clarita Crane, PT, DPT 11/29/19 3:33 PM

## 2019-12-01 ENCOUNTER — Encounter: Payer: Self-pay | Admitting: Physical Therapy

## 2019-12-01 ENCOUNTER — Other Ambulatory Visit: Payer: Self-pay

## 2019-12-01 ENCOUNTER — Ambulatory Visit (INDEPENDENT_AMBULATORY_CARE_PROVIDER_SITE_OTHER): Payer: Medicare Other | Admitting: Physical Therapy

## 2019-12-01 DIAGNOSIS — M25552 Pain in left hip: Secondary | ICD-10-CM | POA: Diagnosis not present

## 2019-12-01 DIAGNOSIS — R29898 Other symptoms and signs involving the musculoskeletal system: Secondary | ICD-10-CM

## 2019-12-01 DIAGNOSIS — M6281 Muscle weakness (generalized): Secondary | ICD-10-CM

## 2019-12-01 NOTE — Therapy (Signed)
[facility] [address] [place], [place], [address] Phone: [phone]   Fax:  [phone]  Physical Therapy Treatment  Patient Details  Name: Christina Guerrero MRN: [id number] Date of Birth: 1940-06-16 Referring Provider (PT): Gregor Hams, MD   Encounter Date: 12/01/2019   PT End of Session - 12/01/19 1600    Visit Number 2    Number of Visits 12    Date for PT Re-Evaluation 01/06/20    Authorization Type Medicare/BCBS    Progress Note Due on Visit 10    PT Start Time 6606    PT Stop Time 1558    PT Time Calculation (min) 25 min    Activity Tolerance Patient tolerated treatment well    Behavior During Therapy [facility] for tasks assessed/performed           Past Medical History:  Diagnosis Date  . Arthritis   . Chicken pox   . Depression   . GERD (gastroesophageal reflux disease)   . High cholesterol   . Restless leg syndrome   . Sleep apnea    borderline  . Urine incontinence   . UTI (urinary tract infection)     Past Surgical History:  Procedure Laterality Date  . APPENDECTOMY  02/2015   LAPROSCOPIC   . COLONOSCOPY  2012  . LAPAROSCOPIC APPENDECTOMY N/A 03/27/2015   Procedure: APPENDECTOMY LAPAROSCOPIC;  Surgeon: Rolm Bookbinder, MD;  Location: [place];  Service: General;  Laterality: N/A;  . [place]    There were no vitals filed for this visit.   Subjective Assessment - 12/01/19 1534    Subjective hip is the same for now, pain minimal    Pertinent History arthritis, depression, sleep apnea    Limitations Walking    How long can you walk comfortably? "I just don't walk."    Patient Stated Goals improve mobility, walk with less pain    Currently in Pain? Yes    Pain Score 2     Pain Location Hip    Pain Orientation Left    Pain Descriptors / Indicators Aching    Pain Onset More than a month ago    Pain Frequency Constant    Aggravating Factors  walking, sleeping on Lt side    Pain  Relieving Factors cortisone injections                             [facility] Adult PT Treatment/Exercise - 12/01/19 1535      Exercises   Exercises Knee/Hip      Knee/Hip Exercises: Aerobic   Recumbent Bike L3 x 5 min      Manual Therapy   Manual Therapy Soft tissue mobilization    Manual therapy comments skilled palpation and monitoring of soft tissue during DN    Soft tissue mobilization Lt glute med/min with hypervolt            Trigger Point Dry Needling - 12/01/19 1600    Consent Given? Yes    Education Handout Provided Previously provided    Muscles Treated Back/Hip Gluteus minimus;Gluteus medius    Electrical Stimulation Performed with Dry Needling Yes    E-stim with Dry Needling Details to tolerance x 5 min    Gluteus Minimus Response Twitch response elicited    Gluteus Medius Response Twitch response elicited                  PT Short Term  Goals - 11/25/19 1547      PT SHORT TERM GOAL #1   Title independent with initial HEP    Status New    Target Date 12/16/19             PT Long Term Goals - 11/25/19 1547      PT LONG TERM GOAL #1   Title independent with final HEP    Status New    Target Date 01/06/20      PT LONG TERM GOAL #2   Title demonstrate at least 4/4 Lt hip strength for improved function    Status New    Target Date 01/06/20      PT LONG TERM GOAL #3   Title report ability to walk > 10 min with pain < 4/10 for improved function and mobility    Status New    Target Date 01/06/20      PT LONG TERM GOAL #4   Title report 75% improvement in sleep for improved pain and function    Status New    Target Date 01/06/20                 Plan - 12/01/19 1600    Clinical Impression Statement Pt arrived late to session, but tolerated DN well with positive response today.  No goals met as only 2nd visit, will continue to benefit from PT to maximize function.    Personal Factors and Comorbidities Social  Background;Comorbidity 2;Fitness    Comorbidities arthritis, depression    Examination-Activity Limitations Sleep;Locomotion Level;Stairs;Stand    Examination-Participation Restrictions Cleaning;Community Activity;Yard Work    Merchant [employer] officer Evolving/Moderate complexity    Rehab Potential Good    PT Frequency 2x / week   1-2x/wk   PT Duration 6 weeks    PT Treatment/Interventions ADLs/Self Care Home Management;Cryotherapy;Electrical Stimulation;Iontophoresis 38m/ml Dexamethasone;Moist Heat;Therapeutic exercise;Therapeutic activities;Functional mobility training;Stair training;Gait training;Ultrasound;Neuromuscular re-education;Patient/family education;Manual techniques;Taping;Dry needling    PT Next Visit Plan review HEP, assess response to DN to glutes, progress hip strength/general exercise    PT Home Exercise Plan Access Code: [email or web address]   Consulted and Agree with Plan of Care Patient           Patient will benefit from skilled therapeutic intervention in order to improve the following deficits and impairments:  Increased fascial restricitons, Increased muscle spasms, Pain, Impaired flexibility, Decreased mobility, Decreased strength, Decreased activity tolerance, Decreased endurance  Visit Diagnosis: Pain in left hip  Other symptoms and signs involving the musculoskeletal system  Muscle weakness (generalized)     Problem List Patient Active Problem List   Diagnosis Date Noted  . [place] of recurrent [place] disorder ([place] 06/24/2016  . S/P laparoscopic appendectomy 03/27/2015  . GERD (gastroesophageal reflux disease) 05/03/2013  . Restless leg 05/03/2013      SLaureen Abrahams PT, DPT 12/01/19 4:02 PM     [facility] [address] [address]Phone: [phone]  Fax:  [phone] Name: Christina FagerMRN: [id number] of Birth: 9Sep 14, 1942

## 2019-12-14 ENCOUNTER — Encounter: Payer: Medicare Other | Admitting: Physical Therapy

## 2019-12-14 ENCOUNTER — Telehealth: Payer: Self-pay | Admitting: Physical Therapy

## 2019-12-14 NOTE — Telephone Encounter (Signed)
LVM on cell (home phone not accepting messages) due to missed PT appt today.  Requested call back and reminded of next appt.  Clarita Crane, PT, DPT 12/14/19 1:28 PM

## 2019-12-15 ENCOUNTER — Ambulatory Visit: Payer: Medicare Other | Attending: Internal Medicine

## 2019-12-15 ENCOUNTER — Other Ambulatory Visit (HOSPITAL_COMMUNITY): Payer: Self-pay | Admitting: Internal Medicine

## 2019-12-15 ENCOUNTER — Other Ambulatory Visit: Payer: Self-pay

## 2019-12-15 DIAGNOSIS — Z23 Encounter for immunization: Secondary | ICD-10-CM

## 2019-12-15 NOTE — Progress Notes (Signed)
   Covid-19 Vaccination Clinic  Name:  Drishti Pepperman    MRN: [id number] DOB: [birth year]  12/15/2019  Ms. Hibbitts was observed post Covid-19 immunization for 15 minutes without incident. She was provided with Vaccine Information Sheet and instruction to access the V-Safe system.   Ms. Heyne was instructed to call 911 with any severe reactions post vaccine: Marland Kitchen Difficulty breathing  . Swelling of face and throat  . A fast heartbeat  . A bad rash all over body  . Dizziness and weakness

## 2019-12-16 ENCOUNTER — Ambulatory Visit (INDEPENDENT_AMBULATORY_CARE_PROVIDER_SITE_OTHER): Payer: Medicare Other | Admitting: Rehabilitative and Restorative Service Providers"

## 2019-12-16 ENCOUNTER — Encounter: Payer: Self-pay | Admitting: Rehabilitative and Restorative Service Providers"

## 2019-12-16 ENCOUNTER — Other Ambulatory Visit: Payer: Self-pay

## 2019-12-16 DIAGNOSIS — R29898 Other symptoms and signs involving the musculoskeletal system: Secondary | ICD-10-CM

## 2019-12-16 DIAGNOSIS — M6281 Muscle weakness (generalized): Secondary | ICD-10-CM

## 2019-12-16 DIAGNOSIS — M25552 Pain in left hip: Secondary | ICD-10-CM | POA: Diagnosis not present

## 2019-12-16 NOTE — Therapy (Signed)
[facility] [address] [place], [place], [address] Phone: [phone]   Fax:  [phone]  Physical Therapy Treatment  Patient Details  Name: Christina Guerrero MRN: [id number] Date of Birth: 09/19/1940 Referring Provider (PT): Rodolph Bong, MD   Encounter Date: 12/16/2019   PT End of Session - 12/16/19 1416    Visit Number 3    Number of Visits 12    Date for PT Re-Evaluation 01/06/20    Authorization Type Medicare/BCBS    Progress Note Due on Visit 10    PT Start Time 1423    PT Stop Time 1506    PT Time Calculation (min) 43 min    Activity Tolerance Patient tolerated treatment well    Behavior During Therapy [facility] for tasks assessed/performed           Past Medical History:  Diagnosis Date  . Arthritis   . Chicken pox   . Depression   . GERD (gastroesophageal reflux disease)   . High cholesterol   . Restless leg syndrome   . Sleep apnea    borderline  . Urine incontinence   . UTI (urinary tract infection)     Past Surgical History:  Procedure Laterality Date  . APPENDECTOMY  02/2015   LAPROSCOPIC   . COLONOSCOPY  2012  . LAPAROSCOPIC APPENDECTOMY N/A 03/27/2015   Procedure: APPENDECTOMY LAPAROSCOPIC;  Surgeon: Emelia Loron, MD;  Location: [facility] OR;  Service: General;  Laterality: N/A;  . TONSILLECTOMY AND ADENOIDECTOMY  1954    There were no vitals filed for this visit.   Subjective Assessment - 12/16/19 1433    Subjective Pt. stated pain was constant and similar, not sure of any improvement to this point.    Pertinent History arthritis, depression, sleep apnea    Limitations Walking    How long can you walk comfortably? "I just don't walk."    Patient Stated Goals improve mobility, walk with less pain    Currently in Pain? Yes    Pain Score 3     Pain Orientation Left;Lateral    Pain Descriptors / Indicators Aching    Pain Type Acute pain    Pain Onset More than a month ago    Pain Frequency Constant     Aggravating Factors  lying on side, insidious constant at times.                             [facility] Adult PT Treatment/Exercise - 12/16/19 0001      Exercises   Other Exercises  Review of HEP c cues for techniques/importance of use      Knee/Hip Exercises: Stretches   Other Knee/Hip Stretches single knee to opposite shoulder 15 sec x 5      Knee/Hip Exercises: Aerobic   Recumbent Bike Lvl 2 10 mins      Knee/Hip Exercises: Sidelying   Clams Lt LE 3 x 10      Modalities   Modalities Moist Heat      Moist Heat Therapy   Number Minutes Moist Heat 5 Minutes    Moist Heat Location Hip   Lt lateral hip     Manual Therapy   Manual therapy comments skilled palpation and monitoring of soft tissue during DN    Soft tissue mobilization compression to Lt glute med TrP            Trigger Point Dry Needling - 12/16/19 0001  Consent Given? Yes    Education Handout Provided Previously provided    Muscles Treated Back/Hip Gluteus medius   Lt   Gluteus Medius Response Twitch response elicited                PT Education - 12/16/19 1434    Education Details Review of after DN care, education on clinical presentation    Person(s) Educated Patient    Methods Explanation    Comprehension Verbalized understanding            PT Short Term Goals - 11/25/19 1547      PT SHORT TERM GOAL #1   Title independent with initial HEP    Status New    Target Date 12/16/19             PT Long Term Goals - 11/25/19 1547      PT LONG TERM GOAL #1   Title independent with final HEP    Status New    Target Date 01/06/20      PT LONG TERM GOAL #2   Title demonstrate at least 4/4 Lt hip strength for improved function    Status New    Target Date 01/06/20      PT LONG TERM GOAL #3   Title report ability to walk > 10 min with pain < 4/10 for improved function and mobility    Status New    Target Date 01/06/20      PT LONG TERM GOAL #4   Title report 75%  improvement in sleep for improved pain and function    Status New    Target Date 01/06/20                 Plan - 12/16/19 1439    Clinical Impression Statement Self reported poor HEP adherence to this point.  Evaluate of palpation today indicated localized concordant symptoms in Lt lateral hip still present from TrP.  Recommend continued skilled PT services to address as well as improved HEP.    Personal Factors and Comorbidities Social Background;Comorbidity 2;Fitness    Comorbidities arthritis, depression    Examination-Activity Limitations Sleep;Locomotion Level;Stairs;Stand    Examination-Participation Restrictions Cleaning;Community Activity;Yard Work    Conservation officer, historic buildings Evolving/Moderate complexity    Rehab Potential Good    PT Frequency 2x / week   1-2x/wk   PT Duration 6 weeks    PT Treatment/Interventions ADLs/Self Care Home Management;Cryotherapy;Electrical Stimulation;Iontophoresis 4mg /ml Dexamethasone;Moist Heat;Therapeutic exercise;Therapeutic activities;Functional mobility training;Stair training;Gait training;Ultrasound;Neuromuscular re-education;Patient/family education;Manual techniques;Taping;Dry needling    PT Next Visit Plan Continue emphasis on HEP, assess response to DN to glutes, progress hip strength/general exercise    PT Home Exercise Plan Access Code:    Consulted and Agree with Plan of Care Patient           Patient will benefit from skilled therapeutic intervention in order to improve the following deficits and impairments:  Increased fascial restricitons, Increased muscle spasms, Pain, Impaired flexibility, Decreased mobility, Decreased strength, Decreased activity tolerance, Decreased endurance  Visit Diagnosis: Pain in left hip  Other symptoms and signs involving the musculoskeletal system  Muscle weakness (generalized)     Problem List Patient Active Problem List   Diagnosis Date Noted  . Episode of recurrent  major depressive disorder (HCC) 06/24/2016  . S/P laparoscopic appendectomy 03/27/2015  . GERD (gastroesophageal reflux disease) 05/03/2013  . Restless leg 05/03/2013    07/03/2013, PT, DPT, OCS, ATC 12/16/19  2:53 PM    [facility] [address] [place], [place], [address] Phone: [phone]   Fax:  [phone]  Name: Terryn Rosenkranz MRN: [id number] Date of Birth: September 22, 1940

## 2019-12-21 ENCOUNTER — Ambulatory Visit (INDEPENDENT_AMBULATORY_CARE_PROVIDER_SITE_OTHER): Payer: Medicare Other | Admitting: Physical Therapy

## 2019-12-21 ENCOUNTER — Other Ambulatory Visit: Payer: Self-pay

## 2019-12-21 ENCOUNTER — Encounter: Payer: Self-pay | Admitting: Physical Therapy

## 2019-12-21 DIAGNOSIS — M25552 Pain in left hip: Secondary | ICD-10-CM | POA: Diagnosis not present

## 2019-12-21 DIAGNOSIS — M6281 Muscle weakness (generalized): Secondary | ICD-10-CM | POA: Diagnosis not present

## 2019-12-21 DIAGNOSIS — R29898 Other symptoms and signs involving the musculoskeletal system: Secondary | ICD-10-CM | POA: Diagnosis not present

## 2019-12-21 NOTE — Therapy (Signed)
[facility] [address] [place], [place], [address] Phone: [phone]   Fax:  [phone]  Physical Therapy Treatment  Patient Details  Name: Christina Guerrero MRN: [id number] Date of Birth: 09-29-1940 Referring Provider (PT): Gregor Hams, MD   Encounter Date: 12/21/2019   PT End of Session - 12/21/19 1412    Visit Number 4    Number of Visits 12    Date for PT Re-Evaluation 01/06/20    Authorization Type Medicare/BCBS    Progress Note Due on Visit 10    PT Start Time 1300    PT Stop Time 1340    PT Time Calculation (min) 40 min    Activity Tolerance Patient tolerated treatment well    Behavior During Therapy [facility] for tasks assessed/performed           Past Medical History:  Diagnosis Date  . Arthritis   . Chicken pox   . Depression   . GERD (gastroesophageal reflux disease)   . High cholesterol   . Restless leg syndrome   . Sleep apnea    borderline  . Urine incontinence   . UTI (urinary tract infection)     Past Surgical History:  Procedure Laterality Date  . APPENDECTOMY  02/2015   LAPROSCOPIC   . COLONOSCOPY  2012  . LAPAROSCOPIC APPENDECTOMY N/A 03/27/2015   Procedure: APPENDECTOMY LAPAROSCOPIC;  Surgeon: Rolm Bookbinder, MD;  Location: [place];  Service: General;  Laterality: N/A;  . [place]    There were no vitals filed for this visit.   Subjective Assessment - 12/21/19 1304    Subjective feels better at this time.  husband passed away 2 days ago - hasn't been able to exercise.    Pertinent History arthritis, depression, sleep apnea    Limitations Walking    How long can you walk comfortably? "I just don't walk."    Patient Stated Goals improve mobility, walk with less pain    Currently in Pain? Yes    Pain Score 2     Pain Location Hip    Pain Orientation Left;Lateral    Pain Descriptors / Indicators Aching    Pain Type Acute pain    Pain Onset More than a month ago     Pain Frequency Constant    Aggravating Factors  lying on side, constant at times    Pain Relieving Factors cortisone injections                             [facility] Adult PT Treatment/Exercise - 12/21/19 1307      Knee/Hip Exercises: Stretches   Piriformis Stretch Right;Left;3 reps;30 seconds    Other Knee/Hip Stretches single knee to chest 3x30 sec bil      Knee/Hip Exercises: Aerobic   Recumbent Bike L4 x 8 min      Knee/Hip Exercises: Seated   Sit to Sand 10 reps;without UE support      Knee/Hip Exercises: Supine   Bridges 10 reps    Bridges Limitations with isometric hip abduction - L4 band      Knee/Hip Exercises: Sidelying   Clams Lt LE 3 x 10    Other Sidelying Knee/Hip Exercises reverse clam 3x10; Lt                    PT Short Term Goals - 12/16/19 1452      PT SHORT TERM GOAL #1  Title independent with initial HEP    Status Not Met    Target Date 12/16/19             PT Long Term Goals - 11/25/19 1547      PT LONG TERM GOAL #1   Title independent with final HEP    Status New    Target Date 01/06/20      PT LONG TERM GOAL #2   Title demonstrate at least 4/4 Lt hip strength for improved function    Status New    Target Date 01/06/20      PT LONG TERM GOAL #3   Title report ability to walk > 10 min with pain < 4/10 for improved function and mobility    Status New    Target Date 01/06/20      PT LONG TERM GOAL #4   Title report 75% improvement in sleep for improved pain and function    Status New    Target Date 01/06/20                 Plan - 12/21/19 1412    Clinical Impression Statement Pt tolerated session well today, and despite limited compliance with HEP is demonstrating improvement in pain and increased function overall.  Pt would like to continue with PT to work on Lt hip and maximize function, and also with recent family tragedy (husband passed away) so may be limited in ability to attend appts.  Will  plan to treat as able and coordinate schedules with pt.    Personal Factors and Comorbidities Social Background;Comorbidity 2;Fitness    Comorbidities arthritis, depression    Examination-Activity Limitations Sleep;Locomotion Level;Stairs;Stand    Examination-Participation Restrictions Cleaning;Community Activity;Yard Work    Merchant navy officer Evolving/Moderate complexity    Rehab Potential Good    PT Frequency 2x / week   1-2x/wk   PT Duration 6 weeks    PT Treatment/Interventions ADLs/Self Care Home Management;Cryotherapy;Electrical Stimulation;Iontophoresis 65m/ml Dexamethasone;Moist Heat;Therapeutic exercise;Therapeutic activities;Functional mobility training;Stair training;Gait training;Ultrasound;Neuromuscular re-education;Patient/family education;Manual techniques;Taping;Dry needling    PT Next Visit Plan Continue emphasis on HEP, progress hip strength/general exercise; see how she did without DN    PT Home Exercise Plan Access Code: [email or web address]   Consulted and Agree with Plan of Care Patient           Patient will benefit from skilled therapeutic intervention in order to improve the following deficits and impairments:  Increased fascial restricitons, Increased muscle spasms, Pain, Impaired flexibility, Decreased mobility, Decreased strength, Decreased activity tolerance, Decreased endurance  Visit Diagnosis: Pain in left hip  Other symptoms and signs involving the musculoskeletal system  Muscle weakness (generalized)     Problem List Patient Active Problem List   Diagnosis Date Noted  . Episode of recurrent major depressive disorder ([place] 06/24/2016  . S/P laparoscopic appendectomy 03/27/2015  . GERD (gastroesophageal reflux disease) 05/03/2013  . Restless leg 05/03/2013      SLaureen Abrahams PT, DPT 12/21/19 2:14 PM    [facility] [address] [address]Phone: [phone]  Fax:   [phone] Name: CLanika ColgateMRN: [id number] of Birth: [birth year]

## 2019-12-23 ENCOUNTER — Encounter: Payer: Medicare Other | Admitting: Rehabilitative and Restorative Service Providers"

## 2019-12-27 ENCOUNTER — Ambulatory Visit (INDEPENDENT_AMBULATORY_CARE_PROVIDER_SITE_OTHER): Payer: Medicare Other

## 2019-12-27 ENCOUNTER — Other Ambulatory Visit: Payer: Self-pay

## 2019-12-27 ENCOUNTER — Encounter: Payer: Self-pay | Admitting: Family Medicine

## 2019-12-27 ENCOUNTER — Ambulatory Visit (INDEPENDENT_AMBULATORY_CARE_PROVIDER_SITE_OTHER): Payer: Medicare Other | Admitting: Family Medicine

## 2019-12-27 VITALS — BP 146/78 | HR 63 | Ht 65.5 in | Wt 157.4 lb

## 2019-12-27 DIAGNOSIS — M25552 Pain in left hip: Secondary | ICD-10-CM

## 2019-12-27 DIAGNOSIS — F4321 Adjustment disorder with depressed mood: Secondary | ICD-10-CM

## 2019-12-27 NOTE — Patient Instructions (Signed)
Thank you for coming in today.  Please get an Xray today before you leave  Continue the PT especially the home exercise.   Don't forget to take care of yourself too.  Pay attention to depression.   Recheck with me in about 1 month especially if not better.

## 2019-12-27 NOTE — Progress Notes (Signed)
   Philbert Riser acted as Neurosurgeon.   Christina Guerrero is a 79 y.o. female who presents to [facility] at [facility] today for f/u  L lateral hip pain due to trochanteric bursitis.  Pt was last seen by Dr. Denyse Amass on 11/15/19, given an injection, Voltren gel 4xday, and was referred to outpatient PT of which she has completed 4 visits.  Since her last visit, pt reports she is feeling much better with PT. PT complains of having a very hard mattress. She purchased a memory foam, however feels that it is too soft.    Of note Marketa's husband died last week after a several week illness.  Diagnostic testing: L hip XR- 05/07/13  Pertinent review of systems: No fevers or chills  Radiating pn: slight radiating pn on lateral thigh.   Relevant historical information: History depression   Exam:  BP (!) 146/78 (BP Location: Right Arm, Patient Position: Sitting)   Pulse 63   Ht 5' 5.5" (1.664 m)   Wt 157 lb 6.4 oz (71.4 kg)   SpO2 96%   BMI 25.79 kg/m  General: Well Developed, well nourished, and in no acute distress.   MSK: Left hip normal gait    Lab and Radiology Results X-ray images left hip obtained today personally and independently interpreted Mild left hip DJD.  No fractures or significant other abnormalities visible. Await formal radiology review    Assessment and Plan: 79 y.o. female with left lateral hip pain due to trochanteric bursitis.  Improved following injection 6 weeks ago and with 4 visits of physical therapy.  Per physical therapy notes Shamia has not been doing her home exercises very much.  She has been dealing with her very ill husband who recently died which explains her general noncompliance with home exercise program.  Spent time today discussing her current state of health and specifically her hip in addition to her grief.  Recommend setting a set time during the day where she does her exercises as a way to help her be a little more  consistent.  Plan to continue physical therapy.  Get x-ray today and recheck back in 4 weeks especially if not better.  Discussed that a new mattress probably is not going to help all that much but if she wants to consider a new mattress hybrid style may be more comfortable for her.   PDMP not reviewed this encounter. Orders Placed This Encounter  Procedures  . DG Hip Unilat W OR W/O Pelvis 2-3 Views Left    Standing Status:   Future    Number of Occurrences:   1    Standing Expiration Date:   12/26/2020    Order Specific Question:   Reason for Exam (SYMPTOM  OR DIAGNOSIS REQUIRED)    Answer:   eval left hip pain    Order Specific Question:   Preferred imaging location?    Answer:   [facility]   No orders of the defined types were placed in this encounter.    Discussed warning signs or symptoms. Please see discharge instructions. Patient expresses understanding.   The above documentation has been reviewed and is accurate and complete [name], M.D.

## 2019-12-28 ENCOUNTER — Encounter: Payer: Self-pay | Admitting: Rehabilitative and Restorative Service Providers"

## 2019-12-28 ENCOUNTER — Ambulatory Visit (INDEPENDENT_AMBULATORY_CARE_PROVIDER_SITE_OTHER): Payer: Medicare Other | Admitting: Rehabilitative and Restorative Service Providers"

## 2019-12-28 DIAGNOSIS — R29898 Other symptoms and signs involving the musculoskeletal system: Secondary | ICD-10-CM | POA: Diagnosis not present

## 2019-12-28 DIAGNOSIS — M25552 Pain in left hip: Secondary | ICD-10-CM | POA: Diagnosis not present

## 2019-12-28 DIAGNOSIS — M6281 Muscle weakness (generalized): Secondary | ICD-10-CM

## 2019-12-28 NOTE — Progress Notes (Signed)
MRI left hip shows mild to medium arthritis.  No fractures.

## 2019-12-28 NOTE — Therapy (Signed)
[facility] [address] [place], [place], [address] Phone: [phone]   Fax:  [phone]  Physical Therapy Treatment  Patient Details  Name: Christina Guerrero MRN: [id number] Date of Birth: December 23, 1940 Referring Provider (PT): Gregor Hams, MD   Encounter Date: 12/28/2019   PT End of Session - 12/28/19 1056    Visit Number 5    Number of Visits 12    Date for PT Re-Evaluation 01/06/20    Authorization Type Medicare/BCBS    Progress Note Due on Visit 10    PT Start Time 1057    PT Stop Time 1136    PT Time Calculation (min) 39 min    Activity Tolerance Patient tolerated treatment well    Behavior During Therapy [facility] for tasks assessed/performed           Past Medical History:  Diagnosis Date  . Arthritis   . Chicken pox   . Depression   . GERD (gastroesophageal reflux disease)   . High cholesterol   . Restless leg syndrome   . Sleep apnea    borderline  . Urine incontinence   . UTI (urinary tract infection)     Past Surgical History:  Procedure Laterality Date  . APPENDECTOMY  02/2015   LAPROSCOPIC   . COLONOSCOPY  2012  . LAPAROSCOPIC APPENDECTOMY N/A 03/27/2015   Procedure: APPENDECTOMY LAPAROSCOPIC;  Surgeon: Rolm Bookbinder, MD;  Location: [place];  Service: General;  Laterality: N/A;  . [place]    There were no vitals filed for this visit.   Subjective Assessment - 12/28/19 1107    Subjective Pt. indicated feeling like she was doing well enough pain wise to not having needling performed today.  Pt. reported mild symptoms overall.  Pt. stated seeing MD and reported plan to improve HEP and also to improve exercise routine for general health.    Pertinent History arthritis, depression, sleep apnea    Limitations Walking    How long can you walk comfortably? "I just don't walk."    Patient Stated Goals improve mobility, walk with less pain    Currently in Pain? Yes    Pain Score --    reported mlid   Pain Location Hip    Pain Orientation Left    Pain Descriptors / Indicators Aching    Pain Type Acute pain    Pain Onset More than a month ago    Pain Frequency Intermittent    Aggravating Factors  nothing specific reported    Pain Relieving Factors exercises, dn helped                             [facility] Adult PT Treatment/Exercise - 12/28/19 0001      Exercises   Other Exercises  HEp review      Knee/Hip Exercises: Stretches   Piriformis Stretch 30 seconds;3 reps;Both      Knee/Hip Exercises: Aerobic   Nustep Lvl 6 12 mins      Knee/Hip Exercises: Supine   Bridges 2 sets;15 reps;Both    Bridges Limitations hip abd band hold blue      Knee/Hip Exercises: Sidelying   Hip ABduction 2 sets;10 reps;Left    Other Sidelying Knee/Hip Exercises reverse clam 3x10; Lt                  PT Education - 12/28/19 1109    Education Details Review of HEP  Person(s) Educated Patient    Methods Explanation    Comprehension Verbalized understanding;Returned demonstration            PT Short Term Goals - 12/16/19 1452      PT SHORT TERM GOAL #1   Title independent with initial HEP    Status Not Met    Target Date 12/16/19             PT Long Term Goals - 12/28/19 1109      PT LONG TERM GOAL #1   Title independent with final HEP    Status On-going    Target Date 01/06/20      PT LONG TERM GOAL #2   Title demonstrate at least 4/5 Lt hip strength for improved function    Status On-going    Target Date 01/06/20      PT LONG TERM GOAL #3   Title report ability to walk > 10 min with pain < 4/10 for improved function and mobility    Status Achieved      PT LONG TERM GOAL #4   Title report 75% improvement in sleep for improved pain and function    Status Achieved                 Plan - 12/28/19 1111    Clinical Impression Statement Pt. to benefit from continued guidance in HEP performance as well as encouragement and  support for increased exercise activity load for improved presentation overall.    Personal Factors and Comorbidities Social Background;Comorbidity 2;Fitness    Comorbidities arthritis, depression    Examination-Activity Limitations Sleep;Locomotion Level;Stairs;Stand    Examination-Participation Restrictions Cleaning;Community Activity;Yard Work    [employer]chant [employer] officer Evolving/Moderate complexity    Rehab Potential Good    PT Frequency 2x / week   1-2x/wk   PT Duration 6 weeks    PT Treatment/Interventions ADLs/Self Care Home Management;Cryotherapy;Electrical Stimulation;Iontophoresis 6m/ml Dexamethasone;Moist Heat;Therapeutic exercise;Therapeutic activities;Functional mobility training;Stair training;Gait training;Ultrasound;Neuromuscular re-education;Patient/family education;Manual techniques;Taping;Dry needling    PT Next Visit Plan Continue emphasis on HEP, progress hip strength/general exercise    PT Home Exercise Plan Access Code: [email or web address]   Consulted and Agree with Plan of Care Patient           Patient will benefit from skilled therapeutic intervention in order to improve the following deficits and impairments:  Increased fascial restricitons, Increased muscle spasms, Pain, Impaired flexibility, Decreased mobility, Decreased strength, Decreased activity tolerance, Decreased endurance  Visit Diagnosis: Pain in left hip  Other symptoms and signs involving the musculoskeletal system  Muscle weakness (generalized)     Problem List Patient Active Problem List   Diagnosis Date Noted  . Episode of recurrent major depressive disorder ([place] 06/24/2016  . S/P laparoscopic appendectomy 03/27/2015  . GERD (gastroesophageal reflux disease) 05/03/2013  . Restless leg 05/03/2013    MScot Jun PT, DPT, OCS, ATC 12/28/19  11:35 AM    [facility] [address] [address]Phone: [phone]  Fax:   [phone] Name: Christina WeaverMRN: [id number] of Birth: [birth year]

## 2019-12-29 ENCOUNTER — Telehealth (INDEPENDENT_AMBULATORY_CARE_PROVIDER_SITE_OTHER): Payer: Medicare Other | Admitting: Psychiatry

## 2019-12-29 ENCOUNTER — Other Ambulatory Visit: Payer: Self-pay

## 2019-12-29 ENCOUNTER — Encounter (HOSPITAL_COMMUNITY): Payer: Self-pay | Admitting: Psychiatry

## 2019-12-29 VITALS — Wt 157.0 lb

## 2019-12-29 DIAGNOSIS — F4321 Adjustment disorder with depressed mood: Secondary | ICD-10-CM

## 2019-12-29 DIAGNOSIS — F331 Major depressive disorder, recurrent, moderate: Secondary | ICD-10-CM | POA: Diagnosis not present

## 2019-12-29 MED ORDER — FLUOXETINE HCL 10 MG PO CAPS: ORAL_CAPSULE | ORAL | 1 refills | Status: DC

## 2019-12-29 NOTE — Progress Notes (Signed)
Virtual Visit via Telephone Note  I connected with Christina Guerrero on 12/29/19 at 10:40 AM EDT by telephone and verified that I am speaking with the correct person using two identifiers.  Location: Patient: Home Provider: Home Office   I discussed the limitations, risks, security and privacy concerns of performing an evaluation and management service by telephone and the availability of in person appointments. I also discussed with the patient that there may be a patient responsible charge related to this service. The patient expressed understanding and agreed to proceed.   History of Present Illness: Patient is evaluated by phone session.  She called [place] because she noticed slipping into depression.  Her husband died on 2024/11/14in nursing facility.  Patient told her husband declined rapidly and his dementia got worse and then he developed UTI and week and a half ago passed away.  Patient told she has a hard time dealing with the loss and her depression is coming back.  She admitted crying spells, racing thoughts, feeling nervous and anxious.  She has no motivation to do things.  She had a very good supportive family.  Her daughter came twice to visit her from [place] and her son who lives in [place] has been in touch with her regularly.  Her son asked her to come to [place] on this weekend to watch a soccer game but she is very nervous about driving.  Patient told usually she will go to soccer but she feel that there is no motivation and desire to do anything.  She had lost few pounds since the last weeks.  Patient told she was so busy taking care of her husband but now she has so much time in her hand and she does not know what to do.  Patient was offered therapy from the hospice but patient did not accept but now she is thinking about it.  Patient denies any suicidal thoughts or homicidal thought but admitted having a lot of ruminative thoughts, crying spells and anxiety.  She is taking  Requip which is helping her restless leg.  Patient denies any paranoia.  She lives by herself with 2 cats.  Her daughter-in-law, son and daughter are very involved with her and they keep checking on her.  She is open to restart antidepressant that she has discontinued many months ago.  She was on Effexor but like to try something different.  Past Psychiatric History:Reviewed. H/Odepression started in 1989. SawDr. Wendall Mola. Took klonopincaused thinning of hair and Wellbutrin did not help. Took Effexor for a long time until he stopped on her own.  No h/osuicidal attempt, inpatient treatment, paranoia, hallucination, aggressive behavior, mania or psychosis.    Psychiatric Specialty Exam: Physical Exam  Review of Systems  Weight 157 lb (71.2 kg).There is no height or weight on file to calculate BMI.  General Appearance: NA  Eye Contact:  NA  Speech:  Slow  Volume:  Decreased  Mood:  Depressed  Affect:  NA  Thought Process:  Goal Directed  Orientation:  Full (Time, Place, and Person)  Thought Content:  Rumination  Suicidal Thoughts:  No  Homicidal Thoughts:  No  Memory:  Immediate;   Good Recent;   Fair Remote;   Fair  Judgement:  Intact  Insight:  Fair  Psychomotor Activity:  NA  Concentration:  Concentration: Fair and Attention Span: Fair  Recall:  Good  Fund of Knowledge:  Good  Language:  Good  Akathisia:  No  Handed:  Right  AIMS (if indicated):     Assets:  Communication Skills Desire for Improvement Housing Resilience Social Support  ADL's:  Intact  Cognition:  WNL  Sleep:   poor      Assessment and Plan: Major depressive disorder, recurrent.  Grief.  Discussed recent loss of her husband who she is a primary care person.  I recommend she should consider grief counseling through hospice which was offered.  Patient promised that she will give her call.  After some discussion she agreed to try Prozac which she has never tried before.  We will try Prozac  10 mg daily for 1 week and then 20 mg daily.  I discussed possible GI side effects from the SSRIs.  Discussed to call us back if she feels any worsening of symptoms.  We will follow-up in 4 to 6 weeks.  Patient does not want to go back to Effexor which she took for many years.  Follow Up Instructions:    I discussed the assessment and treatment plan with the patient. The patient was provided an opportunity to ask questions and all were answered. The patient agreed with the plan and demonstrated an understanding of the instructions.   The patient was advised to call back or seek an in-person evaluation if the symptoms worsen or if the condition fails to improve as anticipated.  I provided 15 minutes of non-face-to-face time during this encounter.   Cleotis Nipper, MD

## 2019-12-30 ENCOUNTER — Other Ambulatory Visit: Payer: Self-pay

## 2019-12-30 ENCOUNTER — Ambulatory Visit (INDEPENDENT_AMBULATORY_CARE_PROVIDER_SITE_OTHER): Payer: Medicare Other | Admitting: Rehabilitative and Restorative Service Providers"

## 2019-12-30 ENCOUNTER — Encounter: Payer: Self-pay | Admitting: Rehabilitative and Restorative Service Providers"

## 2019-12-30 DIAGNOSIS — R29898 Other symptoms and signs involving the musculoskeletal system: Secondary | ICD-10-CM

## 2019-12-30 DIAGNOSIS — M6281 Muscle weakness (generalized): Secondary | ICD-10-CM | POA: Diagnosis not present

## 2019-12-30 DIAGNOSIS — M25552 Pain in left hip: Secondary | ICD-10-CM

## 2019-12-30 NOTE — Therapy (Signed)
[facility] Therapy [address] [place], [place], [address] Phone: [phone]   Fax:  [phone]  Physical Therapy Treatment  Patient Details  Name: Christina Guerrero MRN: [id number] Date of Birth: Jul 10, 1940 Referring Provider (PT): Gregor Hams, MD   Encounter Date: 12/30/2019   PT End of Session - 12/30/19 1303    Visit Number 6    Number of Visits 12    Date for PT Re-Evaluation 01/06/20    Authorization Type Medicare/BCBS    Progress Note Due on Visit 10    PT Start Time 1302    PT Stop Time 1341    PT Time Calculation (min) 39 min    Activity Tolerance Patient tolerated treatment well    Behavior During Therapy [facility] for tasks assessed/performed           Past Medical History:  Diagnosis Date  . Arthritis   . Chicken pox   . Depression   . GERD (gastroesophageal reflux disease)   . High cholesterol   . Restless leg syndrome   . Sleep apnea    borderline  . Urine incontinence   . UTI (urinary tract infection)     Past Surgical History:  Procedure Laterality Date  . APPENDECTOMY  02/2015   LAPROSCOPIC   . COLONOSCOPY  2012  . LAPAROSCOPIC APPENDECTOMY N/A 03/27/2015   Procedure: APPENDECTOMY LAPAROSCOPIC;  Surgeon: Rolm Bookbinder, MD;  Location: [place];  Service: General;  Laterality: N/A;  . [place]    There were no vitals filed for this visit.   Subjective Assessment - 12/30/19 1305    Subjective Pt. indicated she was told her hip xray showed arthritis.  Pt. indicated some symptoms mild but not enough for dry needling treatment.  Pt. stated overall doing ok today.    Pertinent History arthritis, depression, sleep apnea    Limitations Walking    How long can you walk comfortably? "I just don't walk."    Patient Stated Goals improve mobility, walk with less pain    Currently in Pain? Yes    Pain Score --   mild   Pain Location Hip    Pain Orientation Left    Pain Descriptors /  Indicators Aching    Pain Onset More than a month ago    Pain Frequency Occasional    Aggravating Factors  nothing specific.                             [place] Adult PT Treatment/Exercise - 12/30/19 0001      Self-Care   Self-Care Other Self-Care Comments    Other Self-Care Comments  Education on HEP use, pain neuroscience details including sensitization, imaging education (evidence of abnormal findings without symptoms in individuals)      Knee/Hip Exercises: Aerobic   Nustep Lvl 6 12 mins      Knee/Hip Exercises: Standing   Other Standing Knee Exercises hip abd, ext 20 x each bilateral c SLS focus      Knee/Hip Exercises: Seated   Sit to Sand 20 reps;without UE support   18 inch table                 PT Education - 12/30/19 1325    Education Details Education regarding imaging, pain neuroscience.    Person(s) Educated Patient    Methods Explanation    Comprehension Verbalized understanding  PT Short Term Goals - 12/16/19 1452      PT SHORT TERM GOAL #1   Title independent with initial HEP    Status Not Met    Target Date 12/16/19             PT Long Term Goals - 12/28/19 1109      PT LONG TERM GOAL #1   Title independent with final HEP    Status On-going    Target Date 01/06/20      PT LONG TERM GOAL #2   Title demonstrate at least 4/5 Lt hip strength for improved function    Status On-going    Target Date 01/06/20      PT LONG TERM GOAL #3   Title report ability to walk > 10 min with pain < 4/10 for improved function and mobility    Status Achieved      PT LONG TERM GOAL #4   Title report 75% improvement in sleep for improved pain and function    Status Achieved                 Plan - 12/30/19 1340    Clinical Impression Statement Pt. continued to benefit from educational conversations regarding current presentation as well as strengthening and balance intervention.  Recommend continued skilled PT  services to facilitate transitioning to HEP as appropriate.    Personal Factors and Comorbidities Social Background;Comorbidity 2;Fitness    Comorbidities arthritis, depression    Examination-Activity Limitations Sleep;Locomotion Level;Stairs;Stand    Examination-Participation Restrictions Cleaning;Community Activity;Yard Work    [employer]chant [employer] officer Evolving/Moderate complexity    Rehab Potential Good    PT Frequency 2x / week   1-2x/wk   PT Duration 6 weeks    PT Treatment/Interventions ADLs/Self Care Home Management;Cryotherapy;Electrical Stimulation;Iontophoresis 25m/ml Dexamethasone;Moist Heat;Therapeutic exercise;Therapeutic activities;Functional mobility training;Stair training;Gait training;Ultrasound;Neuromuscular re-education;Patient/family education;Manual techniques;Taping;Dry needling    PT Next Visit Plan Recert for extension of POC date indicated upon return.    PT Home Exercise Plan Access Code: [email or web address]   Consulted and Agree with Plan of Care Patient           Patient will benefit from skilled therapeutic intervention in order to improve the following deficits and impairments:  Increased fascial restricitons, Increased muscle spasms, Pain, Impaired flexibility, Decreased mobility, Decreased strength, Decreased activity tolerance, Decreased endurance  Visit Diagnosis: Pain in left hip  Other symptoms and signs involving the musculoskeletal system  Muscle weakness (generalized)     Problem List Patient Active Problem List   Diagnosis Date Noted  . Episode of recurrent major depressive disorder ([place] 06/24/2016  . S/P laparoscopic appendectomy 03/27/2015  . GERD (gastroesophageal reflux disease) 05/03/2013  . Restless leg 05/03/2013    MScot Jun PT, DPT, OCS, ATC 12/30/19  1:42 PM    [facility] [address] [place] [address]Phone: [phone]  Fax:  [phone] Name: CDenae ZuluetaMRN: [id number] of Birth: 9Sep 01, 1942

## 2020-01-03 ENCOUNTER — Other Ambulatory Visit: Payer: Self-pay

## 2020-01-05 ENCOUNTER — Encounter: Payer: Self-pay | Admitting: Family Medicine

## 2020-01-05 ENCOUNTER — Ambulatory Visit (INDEPENDENT_AMBULATORY_CARE_PROVIDER_SITE_OTHER): Payer: Medicare Other | Admitting: Family Medicine

## 2020-01-05 ENCOUNTER — Other Ambulatory Visit: Payer: Self-pay

## 2020-01-05 ENCOUNTER — Other Ambulatory Visit: Payer: Self-pay | Admitting: Family Medicine

## 2020-01-05 VITALS — BP 150/80 | HR 72 | Temp 98.3°F | Ht 65.5 in | Wt 157.3 lb

## 2020-01-05 DIAGNOSIS — R32 Unspecified urinary incontinence: Secondary | ICD-10-CM | POA: Diagnosis not present

## 2020-01-05 DIAGNOSIS — R030 Elevated blood-pressure reading, without diagnosis of hypertension: Secondary | ICD-10-CM

## 2020-01-05 DIAGNOSIS — Z23 Encounter for immunization: Secondary | ICD-10-CM | POA: Diagnosis not present

## 2020-01-05 LAB — POC URINALSYSI DIPSTICK (AUTOMATED)
Bilirubin, UA: NEGATIVE
Blood, UA: NEGATIVE
Glucose, UA: NEGATIVE
Ketones, UA: NEGATIVE
Nitrite, UA: NEGATIVE
Protein, UA: NEGATIVE
Spec Grav, UA: 1.025 (ref 1.010–1.025)
Urobilinogen, UA: 0.2 E.U./dL
pH, UA: 5.5 (ref 5.0–8.0)

## 2020-01-05 MED ORDER — OXYBUTYNIN CHLORIDE 5 MG PO TABS: 5.0000 mg | ORAL_TABLET | Freq: Three times a day (TID) | ORAL | 1 refills | Status: DC | PRN

## 2020-01-05 NOTE — Progress Notes (Signed)
Christina Guerrero DOB: 09/11/1940 Encounter date: 01/05/2020  This is a 79 y.o. female who presents with Chief Complaint  Patient presents with   Urinary Incontinence    xyears    History of present illness: Husband passed 2.5 weeks ago. Things have been stressful with his care for years and he hasn't been himself for years, but passing was unexpected.   She is more concerned with urinary issues since husband just passed after having UTI.   She has had urinary issues for a couple of years. Didn't want to be on medications in past; but has tried exercises and nothing helping. Has leaking with stress, urge, and at night. Sometimes whole bladder can empty; other times just leak. No discomfort with urination. No abd pain, no back pain. Hasn't seen urology or gynecology. Does feel like she empties ok. But knows she may not.   Bowel movements are a little soft, but otherwise ok.   Last uti was 06/2017.    Allergies  Allergen Reactions   Other     propolene glycol   Current Meds  Medication Sig   FLUoxetine (PROZAC) 10 MG capsule Take one capsule daily for one week and than two capsule daily   NAPROXEN PO Take by mouth.   omeprazole (PRILOSEC) 20 MG capsule TAKE 1 CAPSULE(20 MG) BY MOUTH DAILY   rOPINIRole (REQUIP) 0.5 MG tablet Take 1 tablet (0.5 mg total) by mouth at bedtime.   rOPINIRole (REQUIP) 2 MG tablet Take 1 tablet (2 mg total) by mouth at bedtime. Appointment required for further refills.   rosuvastatin (CRESTOR) 20 MG tablet TAKE 1 TABLET BY MOUTH DAILY   triamcinolone cream (KENALOG) 0.1 % Apply 1 application topically 2 (two) times daily.    Review of Systems  Constitutional: Negative for chills, fatigue and fever.  Respiratory: Negative for cough, chest tightness, shortness of breath and wheezing.   Cardiovascular: Negative for chest pain, palpitations and leg swelling.  Genitourinary: Positive for frequency and urgency. Negative for difficulty urinating  and dysuria.    Objective:  BP (!) 150/80 (BP Location: Left Arm, Patient Position: Sitting, Cuff Size: Large)    Pulse 72    Temp 98.3 F (36.8 C) (Oral)    Ht 5' 5.5" (1.664 m)    Wt 157 lb 4.8 oz (71.4 kg)    BMI 25.78 kg/m   Weight: 157 lb 4.8 oz (71.4 kg)   BP Readings from Last 3 Encounters:  01/05/20 (!) 150/80  12/27/19 (!) 146/78  11/15/19 (!) 148/80   Wt Readings from Last 3 Encounters:  01/05/20 157 lb 4.8 oz (71.4 kg)  12/27/19 157 lb 6.4 oz (71.4 kg)  11/15/19 161 lb (73 kg)    Physical Exam Constitutional:      General: She is not in acute distress.    Appearance: She is well-developed.  Cardiovascular:     Rate and Rhythm: Normal rate and regular rhythm.     Heart sounds: Normal heart sounds. No murmur heard.  No friction rub.  Pulmonary:     Effort: Pulmonary effort is normal. No respiratory distress.     Breath sounds: Normal breath sounds. No wheezing or rales.  Abdominal:     Palpations: Abdomen is soft.     Tenderness: There is no abdominal tenderness. There is no right CVA tenderness, left CVA tenderness or guarding.  Musculoskeletal:     Right lower leg: No edema.     Left lower leg: No edema.  Neurological:  Mental Status: She is alert and oriented to person, place, and time.  Psychiatric:        Behavior: Behavior normal.     Assessment/Plan   1. Urinary incontinence, unspecified type UA normal; we are going to try oxybutynin to start with titration. Discussed new medication(s) today with patient. Discussed potential side effects and patient verbalized understanding.  - POC Urinalysis Dipstick - POCT Urinalysis Dipstick (Automated)  2. Elevated blood pressure reading Husband has just recently passed; bp elevated today. I will have her check at home and return in 1 month for recheck in office. I will have her let me know if elevated at home.     Return in about 1 month (around 02/04/2020).    Theodis Shove, MD

## 2020-01-05 NOTE — Patient Instructions (Addendum)
I would suggest starting with the oxybutynin by taking 1/2 tablet (which will be 2.5mg ) twice daily - morning and evening. If no improvement in symptoms and no side effects, you can increase to whole tablet twice daily. I would suggest staying on that dose for a week before further increasing to 1.5 tabs in morning and evening. Let me know if you are still not having some improvement in symptoms at that time.   Check blood pressures at home; let me know if regularly over 140 systolic or 90 diastolic regularly. Bring in home cuff to follow up visit.

## 2020-01-14 ENCOUNTER — Ambulatory Visit (INDEPENDENT_AMBULATORY_CARE_PROVIDER_SITE_OTHER): Payer: Medicare Other | Admitting: Rehabilitative and Restorative Service Providers"

## 2020-01-14 ENCOUNTER — Encounter: Payer: Self-pay | Admitting: Rehabilitative and Restorative Service Providers"

## 2020-01-14 ENCOUNTER — Other Ambulatory Visit: Payer: Self-pay

## 2020-01-14 DIAGNOSIS — M6281 Muscle weakness (generalized): Secondary | ICD-10-CM | POA: Diagnosis not present

## 2020-01-14 DIAGNOSIS — R29898 Other symptoms and signs involving the musculoskeletal system: Secondary | ICD-10-CM

## 2020-01-14 DIAGNOSIS — M25552 Pain in left hip: Secondary | ICD-10-CM

## 2020-01-14 NOTE — Therapy (Signed)
[facility] [address] [place], [place], [address] Phone: [phone]   Fax:  [phone]  Physical Therapy Treatment/Discharge  Patient Details  Name: Christina Guerrero MRN: [id number] Date of Birth: 1940/10/07 Referring Provider (PT): Gregor Hams, MD   Encounter Date: 01/14/2020   PT End of Session - 01/14/20 1143    Visit Number 7    Number of Visits 12    Date for PT Re-Evaluation --    Authorization Type Medicare/BCBS    Progress Note Due on Visit 10    PT Start Time 1120    PT Stop Time 1200    PT Time Calculation (min) 40 min    Activity Tolerance Patient tolerated treatment well    Behavior During Therapy [facility] for tasks assessed/performed           Past Medical History:  Diagnosis Date  . Arthritis   . Chicken pox   . Depression   . GERD (gastroesophageal reflux disease)   . High cholesterol   . Restless leg syndrome   . Sleep apnea    borderline  . Urine incontinence   . UTI (urinary tract infection)     Past Surgical History:  Procedure Laterality Date  . APPENDECTOMY  02/2015   LAPROSCOPIC   . COLONOSCOPY  2012  . LAPAROSCOPIC APPENDECTOMY N/A 03/27/2015   Procedure: APPENDECTOMY LAPAROSCOPIC;  Surgeon: Rolm Bookbinder, MD;  Location: [place];  Service: General;  Laterality: N/A;  . [place]    There were no vitals filed for this visit.   Subjective Assessment - 01/14/20 1133    Subjective Pt. indicated she hasn't done as much at home, stating she felt the grief had set in.  Pt. indicated mild symptoms at times.    Pertinent History arthritis, depression, sleep apnea    Limitations Walking    How long can you walk comfortably? "I just don't walk."    Patient Stated Goals improve mobility, walk with less pain    Currently in Pain? Yes    Pain Score --   mild   Pain Location Hip    Pain Orientation Left    Pain Type Acute pain    Pain Onset More than a month ago    Pain  Frequency Occasional    Aggravating Factors  no specific aggravating factor reported              [facility] PT Assessment - 01/14/20 0001      Assessment   Medical Diagnosis M25.552 (ICD-10-CM) - Lateral pain of left hip    Referring Provider (PT) Gregor Hams, MD      Strength   Right Hip Flexion 5/5    Right Hip Extension 5/5    Right Hip External Rotation  5/5    Right Hip Internal Rotation 5/5    Left Hip Flexion 5/5    Left Hip Extension 5/5    Left Hip External Rotation 5/5    Left Hip Internal Rotation 5/5                         [facility] Adult PT Treatment/Exercise - 01/14/20 0001      Self-Care   Other Self-Care Comments  Extended time spent in conversation and education on importance of use of HEP, education about endorphin and chemical release benefits for mood and happiness c exercise performance as strategy for mental wellbeing in addition to physical gains.  Encouraged  Pt. to share HEP plan with anyone she may trust to help promote accountability (Pt. indicated not having one at this time).        Knee/Hip Exercises: Aerobic   Nustep Lvl 6 15 mins                  PT Education - 01/14/20 1212    Education Details HEP, self care instruction as described    Person(s) Educated Patient    Methods Explanation    Comprehension Verbalized understanding            PT Short Term Goals - 12/16/19 1452      PT SHORT TERM GOAL #1   Title independent with initial HEP    Status Not Met    Target Date 12/16/19             PT Long Term Goals - 01/14/20 1211      PT LONG TERM GOAL #1   Title independent with final HEP    Baseline 01/14/2020: possesses knowledge    Status Partially Met      PT LONG TERM GOAL #2   Title demonstrate at least 4/5 Lt hip strength for improved function    Status Achieved      PT LONG TERM GOAL #3   Title report ability to walk > 10 min with pain < 4/10 for improved function and mobility    Status Achieved       PT LONG TERM GOAL #4   Title report 75% improvement in sleep for improved pain and function    Status Achieved                 Plan - 01/14/20 1207    Clinical Impression Statement Pt. has attended 6 visits overall during course of treatment, reporting improvement in pain symptoms and function.  Function presentation is appropriate for d/c at this time.  Pt. has continued to report difficulty c recent loss of spouse which can contribute to lack of motivation for use of HEP and other exericse based activity.  Discussions with patient have continued to emphasize use of HEP to help manage and prevent symptoms as well as to use exercise as way to promote endorphin release.  Pt. encouraged to call or email as needed for questions and quick check in as well as to return c referral for treatment as she needs.    Personal Factors and Comorbidities Social Background;Comorbidity 2;Fitness    Comorbidities arthritis, depression    Examination-Activity Limitations Sleep;Locomotion Level;Stairs;Stand    Examination-Participation Restrictions Cleaning;Community Activity;Yard Work    Stability/Clinical Decision Making Evolving/Moderate complexity    Rehab Potential Good    PT Frequency --   cert extension for day only.   PT Duration --    PT Treatment/Interventions ADLs/Self Care Home Management;Cryotherapy;Electrical Stimulation;Iontophoresis 64m/ml Dexamethasone;Moist Heat;Therapeutic exercise;Therapeutic activities;Functional mobility training;Stair training;Gait training;Ultrasound;Neuromuscular re-education;Patient/family education;Manual techniques;Taping;Dry needling    PT Next Visit Plan D/C to HEP.    PT Home Exercise Plan Access Code: [email or web address]   Recommended Other Services Continued grief consulations.    Consulted and Agree with Plan of Care Patient           Patient will benefit from skilled therapeutic intervention in order to improve the following deficits and impairments:   Increased fascial restricitons, Increased muscle spasms, Pain, Impaired flexibility, Decreased mobility, Decreased strength, Decreased activity tolerance, Decreased endurance  Visit Diagnosis: Pain in left hip  Other symptoms and signs involving the musculoskeletal  system  Muscle weakness (generalized)     Problem List Patient Active Problem List   Diagnosis Date Noted  . Episode of recurrent major depressive disorder ([place]) 06/24/2016  . S/P laparoscopic appendectomy 03/27/2015  . GERD (gastroesophageal reflux disease) 05/03/2013  . Restless leg 05/03/2013    PHYSICAL THERAPY DISCHARGE SUMMARY  Visits from Start of Care: 6  Current functional level related to goals / functional outcomes: See note   Remaining deficits: See note   Education / Equipment: HEP Plan: Patient agrees to discharge.  Patient goals were partially met. Patient is being discharged due to being pleased with the current functional level.  ?????    Scot Jun, PT, DPT, OCS, ATC 01/14/20  12:15 PM    [facility] [address] [place], [place], [address] Phone: [phone]   Fax:  [phone]  Name: Annika Selke MRN: [id number] Date of Birth: 1940/10/08

## 2020-01-18 ENCOUNTER — Encounter: Payer: Medicare Other | Admitting: Physical Therapy

## 2020-01-25 ENCOUNTER — Encounter: Payer: Medicare Other | Admitting: Physical Therapy

## 2020-01-25 NOTE — Progress Notes (Signed)
   I, Philbert Riser, LAT, ATC acting as a scribe for [name], MD.  Christina Guerrero is a 79 y.o. female who presents to [facility] today for f/u L lateral hip pain due to trochanteric bursitis.  Pt was last seen by Dr. Denyse Amass on 12/27/19 and was advised to dedicate time each day for HEP and to cont PT of which she's completed 7 visits. Today, pt reports she is non-compliant w/ HEP. Pt felt PT visits were helpful. Today, pt locates pain to L lateral hip. No numbness/tingling noted. Currently seeing a psychiatrist for depression/grief.  Starting on Prozac just recently.  Diagnostic testing: 12/27/19 L hip XR                     05/07/13 L hip XR  Pertinent review of systems: No fevers or chills  Relevant historical information: History depression.  Current grief over the loss of her husband.   Exam:  BP 128/74   Pulse 76   Ht 5' 5.5" (1.664 m)   Wt 158 lb 9.6 oz (71.9 kg)   SpO2 97%   BMI 25.99 kg/m  General: Well Developed, well nourished, and in no acute distress.   MSK: Left hip normal-appearing tender palpation mildly greater trochanter.  Normal hip motion.  Hip abduction external rotation strength diminished 4/5.    Assessment and Plan: 79 y.o. female with trochanteric bursitis/hip abductor tendinopathy.  Still symptomatic.  Patient has not improved very likely due to nonadherence to home exercise program.  She notes that she lacks desire energy and motivation to do things and has had a hard time doing the home exercise program.  We discussed options.  Plan for a bit of time to allow her to work on home exercise program while improving her anhedonia.  If not better would proceed with either repeat steroid injection or even MRI.  After discussion of these options she would like to just try the home exercises more.  Of note I believe her grief and depression are causing anhedonia which limits her ability to care for her hip.  She is following up  with her psychiatrist tomorrow.  Agree with Prozac.  Will send note to psychiatric provider.   PDMP not reviewed this encounter. Orders Placed This Encounter  Procedures  . [place] LIMITED JOINT SPACE STRUCTURES LOW LEFT(NO LINKED CHARGES)    Standing Status:   Future    Number of Occurrences:   1    Standing Expiration Date:   07/26/2020    Order Specific Question:   Reason for Exam (SYMPTOM  OR DIAGNOSIS REQUIRED)    Answer:   chronic left hip pain    Order Specific Question:   Preferred imaging location?    Answer:   [facility]   No orders of the defined types were placed in this encounter.    Discussed warning signs or symptoms. Please see discharge instructions. Patient expresses understanding.   The above documentation has been reviewed and is accurate and complete [name], M.D.

## 2020-01-26 ENCOUNTER — Ambulatory Visit: Payer: Self-pay

## 2020-01-26 ENCOUNTER — Ambulatory Visit (INDEPENDENT_AMBULATORY_CARE_PROVIDER_SITE_OTHER): Payer: Medicare Other | Admitting: Family Medicine

## 2020-01-26 ENCOUNTER — Other Ambulatory Visit: Payer: Self-pay

## 2020-01-26 VITALS — BP 128/74 | HR 76 | Ht 65.5 in | Wt 158.6 lb

## 2020-01-26 DIAGNOSIS — M25552 Pain in left hip: Secondary | ICD-10-CM | POA: Diagnosis not present

## 2020-01-26 DIAGNOSIS — G8929 Other chronic pain: Secondary | ICD-10-CM

## 2020-01-26 DIAGNOSIS — F339 Major depressive disorder, recurrent, unspecified: Secondary | ICD-10-CM | POA: Diagnosis not present

## 2020-01-26 NOTE — Patient Instructions (Signed)
Thank you for coming in today.  I think you are probably right about not doing the home exercises is the cause of not getting better.   Talk to your psychiatrist about Anhedonia.   Recheck in about 6 weeks.

## 2020-01-27 ENCOUNTER — Encounter (HOSPITAL_COMMUNITY): Payer: Self-pay | Admitting: Psychiatry

## 2020-01-27 ENCOUNTER — Telehealth (INDEPENDENT_AMBULATORY_CARE_PROVIDER_SITE_OTHER): Payer: Medicare Other | Admitting: Psychiatry

## 2020-01-27 VITALS — Wt 158.0 lb

## 2020-01-27 DIAGNOSIS — F4321 Adjustment disorder with depressed mood: Secondary | ICD-10-CM

## 2020-01-27 DIAGNOSIS — F331 Major depressive disorder, recurrent, moderate: Secondary | ICD-10-CM | POA: Diagnosis not present

## 2020-01-27 MED ORDER — FLUOXETINE HCL 10 MG PO CAPS: ORAL_CAPSULE | ORAL | 1 refills | Status: DC

## 2020-01-27 NOTE — Progress Notes (Signed)
Virtual Visit via Telephone Note  I connected with Christina Guerrero on 01/27/20 at 10:40 AM EST by telephone and verified that I am speaking with the correct person using two identifiers.  Location: Patient: home Provider: home office   I discussed the limitations, risks, security and privacy concerns of performing an evaluation and management service by telephone and the availability of in person appointments. I also discussed with the patient that there may be a patient responsible charge related to this service. The patient expressed understanding and agreed to proceed.   History of Present Illness: Patient is evaluated by phone session.  She is on the phone by herself.  We started her on Prozac 6 weeks ago after she was feeling very sad depressed and having crying spells when going through grief.  Her husband died in 01/05/2023 as his health condition started to get worse rapidly and within 2-week he passed away.  Patient had a hard time dealing with the loss.  She reported her depression coming back.  She was not motivated and she decided to go back on antidepressant.  She has taken Effexor in the past but she wanted to try different medication.  She is taking Prozac 20 mg daily.  So far she has not reported any side effects but is still have low motivation and desire to do anything.  Her sleep is somewhat better and she has no more crying spells but is still sometimes she feel emptiness.  We have recommended grief counseling but patient has got started but is still considering it.  She lives by herself with 2 cats.  On Thanksgiving her son came and it helps.  She is hoping to visit [place] to see her daughter.  She is very close to her daughter.  She denies any hallucination, suicidal thoughts but is still have a lot of ruminative and negative thoughts.  Recently she is seen by her physician for incontinent of urine and she was prescribed medicine but she has not started yet.  She is somewhat  reluctant to take too many medication.  Her appetite is okay.  Her weight is stable.  She denies any agitation, anger, mood swings.   Past Psychiatric History: H/Odepression started in 1989. SawDr. Wendall Mola. Took klonopincaused thinning of hair and Wellbutrin did not help. Took Effexor for a long time but stopped on her own.  No h/osuicidal attempt, inpatient treatment, paranoia, hallucination, aggressive behavior, mania or psychosis.    Psychiatric Specialty Exam: Physical Exam  Review of Systems  Weight 158 lb (71.7 kg).There is no height or weight on file to calculate BMI.  General Appearance: NA  Eye Contact:  NA  Speech:  Slow  Volume:  Decreased  Mood:  Dysphoric  Affect:  NA  Thought Process:  Descriptions of Associations: Intact  Orientation:  Full (Time, Place, and Person)  Thought Content:  Rumination  Suicidal Thoughts:  No  Homicidal Thoughts:  No  Memory:  Immediate;   Good Recent;   Fair Remote;   Fair  Judgement:  Intact  Insight:  Present  Psychomotor Activity:  NA  Concentration:  Concentration: Good and Attention Span: Good  Recall:  Good  Fund of Knowledge:  Good  Language:  Good  Akathisia:  No  Handed:  Right  AIMS (if indicated):     Assets:  Communication Skills Desire for Improvement Housing Resilience Social Support  ADL's:  Intact  Cognition:  WNL  Sleep:   7 hrs  Assessment and Plan: Major depressive disorder, recurrent.  Grief.  Patient is taking Prozac 20 mg and so far no side effects but is still have ruminative and negative thoughts.  Her crying spells is better.  She still have lack of motivation.  I recommend to try Prozac 30 mg to help her residual mood lability.  I also recommend to try over-the-counter melatonin if she has difficulty sleeping.  However her sleep is somewhat improved from the past.  She is taking Requip from her PCP to help her restless leg syndrome.  I again encouraged to consider grief  counseling and patient promised that she will consider it.  Recommended to call us back if she has any question or any concern.  Patient is going to [place] to visit her daughter on Christmas.  Discussed medication side effects and benefits.  We will follow-up in 6 weeks to see if increased dose of Prozac helps.  Otherwise we will consider switching to a different medication.  Follow Up Instructions:    I discussed the assessment and treatment plan with the patient. The patient was provided an opportunity to ask questions and all were answered. The patient agreed with the plan and demonstrated an understanding of the instructions.   The patient was advised to call back or seek an in-person evaluation if the symptoms worsen or if the condition fails to improve as anticipated.  I provided 19 minutes of non-face-to-face time during this encounter.   Cleotis Nipper, MD

## 2020-01-31 ENCOUNTER — Telehealth (HOSPITAL_COMMUNITY): Payer: Self-pay | Admitting: *Deleted

## 2020-01-31 NOTE — Telephone Encounter (Signed)
Prior authorization approved for Prozac 10 mg TID.  Authorized until 13/31/2022. Member ID [id number]

## 2020-02-01 ENCOUNTER — Encounter: Payer: Medicare Other | Admitting: Physical Therapy

## 2020-02-04 ENCOUNTER — Other Ambulatory Visit: Payer: Self-pay | Admitting: Family Medicine

## 2020-02-04 ENCOUNTER — Other Ambulatory Visit: Payer: Self-pay

## 2020-02-04 ENCOUNTER — Encounter: Payer: Self-pay | Admitting: Family Medicine

## 2020-02-04 ENCOUNTER — Ambulatory Visit (INDEPENDENT_AMBULATORY_CARE_PROVIDER_SITE_OTHER): Payer: Medicare Other | Admitting: Family Medicine

## 2020-02-04 VITALS — BP 138/60 | HR 54 | Temp 98.2°F | Ht 65.5 in | Wt 158.5 lb

## 2020-02-04 DIAGNOSIS — R030 Elevated blood-pressure reading, without diagnosis of hypertension: Secondary | ICD-10-CM | POA: Diagnosis not present

## 2020-02-04 DIAGNOSIS — R32 Unspecified urinary incontinence: Secondary | ICD-10-CM | POA: Diagnosis not present

## 2020-02-04 DIAGNOSIS — K219 Gastro-esophageal reflux disease without esophagitis: Secondary | ICD-10-CM

## 2020-02-04 DIAGNOSIS — F4321 Adjustment disorder with depressed mood: Secondary | ICD-10-CM | POA: Diagnosis not present

## 2020-02-04 NOTE — Progress Notes (Signed)
Christina Guerrero DOB: 02-03-1941 Encounter date: 02/04/2020  This is a 79 y.o. female who presents with Chief Complaint  Patient presents with  . Follow-up    History of present illness:  Last visit was 1 month ago.  She had just lost her husband prior to that visit. Sleeping ok. Just doesn't want to do anything. Not losing weight, but appetite isn't great. Not great support system. When bladder cooperates sleeps through night.   Numbers have been better with blood pressure.   We discussed urinary incontinence at that time and started oxybutynin. She states that this has been great. Just one pill has fixed issue.   We also are concerned about blood pressure elevation, but unclear if this was related to grief reaction or true elevation:   Allergies  Allergen Reactions  . Other     propolene glycol   Current Meds  Medication Sig  . FLUoxetine (PROZAC) 10 MG capsule Take three capsule daily  . NAPROXEN PO Take by mouth.  Marland Kitchen omeprazole (PRILOSEC) 20 MG capsule TAKE 1 CAPSULE(20 MG) BY MOUTH DAILY  . oxybutynin (DITROPAN) 5 MG tablet TAKE 1 TABLET(5 MG) BY MOUTH EVERY 8 HOURS AS NEEDED FOR BLADDER SPASMS  . rOPINIRole (REQUIP) 0.5 MG tablet Take 1 tablet (0.5 mg total) by mouth at bedtime.  Marland Kitchen rOPINIRole (REQUIP) 2 MG tablet Take 1 tablet (2 mg total) by mouth at bedtime. Appointment required for further refills.  . rosuvastatin (CRESTOR) 20 MG tablet TAKE 1 TABLET BY MOUTH DAILY  . triamcinolone cream (KENALOG) 0.1 % Apply 1 application topically 2 (two) times daily.    Review of Systems  Constitutional: Negative for chills, fatigue and fever.  Respiratory: Negative for cough, chest tightness, shortness of breath and wheezing.   Cardiovascular: Negative for chest pain, palpitations and leg swelling.    Objective:  BP 138/60 (BP Location: Left Arm, Patient Position: Sitting, Cuff Size: Large)   Pulse (!) 54   Temp 98.2 F (36.8 C) (Oral)   Ht 5' 5.5" (1.664 m)   Wt 158  lb 8 oz (71.9 kg)   BMI 25.97 kg/m   Weight: 158 lb 8 oz (71.9 kg)   BP Readings from Last 3 Encounters:  02/04/20 138/60  01/26/20 128/74  01/05/20 (!) 150/80   Wt Readings from Last 3 Encounters:  02/04/20 158 lb 8 oz (71.9 kg)  01/26/20 158 lb 9.6 oz (71.9 kg)  01/05/20 157 lb 4.8 oz (71.4 kg)    Physical Exam Constitutional:      General: She is not in acute distress.    Appearance: She is well-developed.  Cardiovascular:     Rate and Rhythm: Normal rate and regular rhythm.     Heart sounds: Normal heart sounds. No murmur heard. No friction rub.  Pulmonary:     Effort: Pulmonary effort is normal. No respiratory distress.     Breath sounds: Normal breath sounds. No wheezing or rales.  Musculoskeletal:     Right lower leg: No edema.     Left lower leg: No edema.  Neurological:     Mental Status: She is alert and oriented to person, place, and time.  Psychiatric:        Behavior: Behavior normal.     Assessment/Plan  1. Elevated blood pressure reading Improved today in office; good at last visit check.  She is not on any medication for blood pressure control.  2. Urinary incontinence, unspecified type Doing much better with oxybutynin.   3. Grief Encouraged  counseling through hospice. Continue with psychiatry - just increased prozac to 30mg  daily.  I encouraged her to reach out with any concerns.  I am hopeful that she will feel better with the recent Prozac dose increase.  Reach out if not having improvement in sleep and mood overall.  I have asked her to update me through my chart in about a month to let me know how things are going.  I am happy to intervene as relates to grief response/mood if needed in the interim.   Return in about 3 months (around 05/04/2020) for Chronic condition visit (if you are doing/feeling well we can space to 6 months).  30 minutes spent with patient discussing options for help with treatment as relates to grief response/mood/insomnia,  chart review, charting, exam.   07/04/2020, MD

## 2020-02-04 NOTE — Patient Instructions (Addendum)
Call hospice to ask about grief counseling.   Update me through mychart in a month to let me know how things are going. If you need anything prior to this let me know.

## 2020-02-21 DIAGNOSIS — Z03818 Encounter for observation for suspected exposure to other biological agents ruled out: Secondary | ICD-10-CM | POA: Diagnosis not present

## 2020-03-07 NOTE — Progress Notes (Deleted)
   I, Philbert Riser, LAT, ATC acting as a scribe for Christina Graham, MD. Christina Guerrero is a 81 y.o. female who presents to [facility] at [facility] today for f/u chronic L hip pain due to trochanteric bursitis/hip abductor tendinopathy. Pt was last seen by Dr. Denyse Amass on 01/26/20 and was advised to adhere to HEP and continue psychiatric care and Prozac. Today, pt reports //. Pt locates pain to //.   Dx imaging: 12/27/19 L hip XR  05/07/13 L hip XR  Pertinent review of systems: ***  Relevant historical information: ***   Exam:  There were no vitals taken for this visit. General: Well Developed, well nourished, and in no acute distress.   MSK: ***    Lab and Radiology Results No results found for this or any previous visit (from the past 72 hour(s)). No results found.     Assessment and Plan: 80 y.o. female with ***   PDMP not reviewed this encounter. No orders of the defined types were placed in this encounter.  No orders of the defined types were placed in this encounter.    Discussed warning signs or symptoms. Please see discharge instructions. Patient expresses understanding.   ***

## 2020-03-08 ENCOUNTER — Ambulatory Visit: Payer: Medicare Other | Admitting: Family Medicine

## 2020-03-16 ENCOUNTER — Other Ambulatory Visit: Payer: Self-pay

## 2020-03-16 ENCOUNTER — Encounter (HOSPITAL_COMMUNITY): Payer: Self-pay | Admitting: Psychiatry

## 2020-03-16 ENCOUNTER — Telehealth (INDEPENDENT_AMBULATORY_CARE_PROVIDER_SITE_OTHER): Payer: Medicare Other | Admitting: Psychiatry

## 2020-03-16 DIAGNOSIS — F331 Major depressive disorder, recurrent, moderate: Secondary | ICD-10-CM | POA: Diagnosis not present

## 2020-03-16 MED ORDER — FLUOXETINE HCL 10 MG PO CAPS: ORAL_CAPSULE | ORAL | 2 refills | Status: DC

## 2020-03-16 NOTE — Progress Notes (Signed)
Virtual Visit via Telephone Note  I connected with Christina Guerrero on 03/16/20 at 10:40 AM EST by telephone and verified that I am speaking with the correct person using two identifiers.  Location: Patient: home Provider: home office   I discussed the limitations, risks, security and privacy concerns of performing an evaluation and management service by telephone and the availability of in person appointments. I also discussed with the patient that there may be a patient responsible charge related to this service. The patient expressed understanding and agreed to proceed.   History of Present Illness: Patient is evaluated by phone session.  She is on the phone by herself.  She is now taking Prozac 30 mg and noticed improvement.  She had a trip to [place] on Christmas to visit her daughter but next day her daughter positive with COVID and she was disappointed because they could not do together which were planned.  She feels her sleep is improved and does not require melatonin.  She does not have any crying spells and her ruminative thoughts are less intense and less frequent.  So far she is tolerating Prozac 30 mg and has no tremors shakes or any EPS.  She started painting and she is painting for her granddaughter whose birthday is coming next month.  Patient lives with her 2 cats.  She denies any crying spells.  Her appetite is good.  Her weight is unchanged from the past.  She wants to keep the current medication.  She still thinks about her deceased husband but overall she feels things are coming back to normal.  She is taking Requip prescribed by her PCP.  Past Psychiatric History: H/Odepression started in 1989. SawDr. Wendall Mola. Took klonopincaused thinning of hair and Wellbutrin did not help. Took Effexor for a long time but stopped on her own. No h/osuicidal attempt, inpatient treatment, paranoia, hallucination, aggressive behavior, mania or psychosis.    Psychiatric  Specialty Exam: Physical Exam  Review of Systems  Weight 158 lb (71.7 kg).There is no height or weight on file to calculate BMI.  General Appearance: NA  Eye Contact:  NA  Speech:  Clear and Coherent  Volume:  Normal  Mood:  Euthymic  Affect:  NA  Thought Process:  Goal Directed  Orientation:  Full (Time, Place, and Person)  Thought Content:  Logical  Suicidal Thoughts:  No  Homicidal Thoughts:  No  Memory:  Immediate;   Good Recent;   Good Remote;   Good  Judgement:  Intact  Insight:  Present  Psychomotor Activity:  NA  Concentration:  Concentration: Good and Attention Span: Good  Recall:  Good  Fund of Knowledge:  Good  Language:  Good  Akathisia:  No  Handed:  Right  AIMS (if indicated):     Assets:  Communication Skills Desire for Improvement Housing Resilience Social Support  ADL's:  Intact  Cognition:  WNL  Sleep:   better      Assessment and Plan: Major depressive disorder, recurrent.  Grief.  Patient doing better with Prozac 30 mg and so far she has no side effects.  She does not want to change or increase the dose since things are slowly and gradually going back to normal.  She started painting.  Discussed medication side effects and benefits.  Recommended to call us back if she has any question or any concern.  We will continue Prozac 30 mg daily.  Follow-up in 3 months.  Follow Up Instructions:    I discussed  the assessment and treatment plan with the patient. The patient was provided an opportunity to ask questions and all were answered. The patient agreed with the plan and demonstrated an understanding of the instructions.   The patient was advised to call back or seek an in-person evaluation if the symptoms worsen or if the condition fails to improve as anticipated.  I provided 16 minutes of non-face-to-face time during this encounter.   Cleotis Nipper, MD

## 2020-03-29 ENCOUNTER — Other Ambulatory Visit (HOSPITAL_COMMUNITY): Payer: Self-pay | Admitting: Psychiatry

## 2020-03-29 DIAGNOSIS — F331 Major depressive disorder, recurrent, moderate: Secondary | ICD-10-CM

## 2020-05-04 ENCOUNTER — Other Ambulatory Visit: Payer: Self-pay | Admitting: Family Medicine

## 2020-05-04 DIAGNOSIS — G2581 Restless legs syndrome: Secondary | ICD-10-CM

## 2020-05-24 ENCOUNTER — Other Ambulatory Visit: Payer: Self-pay | Admitting: Family Medicine

## 2020-06-14 ENCOUNTER — Telehealth (INDEPENDENT_AMBULATORY_CARE_PROVIDER_SITE_OTHER): Payer: Medicare Other | Admitting: Psychiatry

## 2020-06-14 ENCOUNTER — Other Ambulatory Visit: Payer: Self-pay

## 2020-06-14 ENCOUNTER — Encounter (HOSPITAL_COMMUNITY): Payer: Self-pay | Admitting: Psychiatry

## 2020-06-14 DIAGNOSIS — F331 Major depressive disorder, recurrent, moderate: Secondary | ICD-10-CM | POA: Diagnosis not present

## 2020-06-14 MED ORDER — FLUOXETINE HCL 10 MG PO CAPS
ORAL_CAPSULE | ORAL | 2 refills | Status: DC
Start: 2020-06-14 — End: 2020-09-14

## 2020-06-14 NOTE — Progress Notes (Signed)
Virtual Visit via Telephone Note  I connected with Christina Guerrero on 06/14/20 at 10:40 AM EDT by telephone and verified that I am speaking with the correct person using two identifiers.  Location: Patient: home Provider: home office   I discussed the limitations, risks, security and privacy concerns of performing an evaluation and management service by telephone and the availability of in person appointments. I also discussed with the patient that there may be a patient responsible charge related to this service. The patient expressed understanding and agreed to proceed.   History of Present Illness: Patient is evaluated by phone session.  Recently she hurt her foot and now using crutches and taking Naprosyn.  Patient told she fell but now feeling better.  She is hoping once she is able to walk about the help of crutches she may start gardening.  Overall she described her mood is better.  She denies any crying spells, feeling of hopelessness or worthlessness.  Sometimes she feel isolated and withdrawn but no suicidal thoughts.  She started reading detective stories that she always liked.  She sleeps at least 10 hours which she feels she is required and she feels fresh next day.  Her appetite is okay.  Her weight is unchanged from the past.  She does think about her deceased husband but no more negative and ruminative thoughts.  She is taking Prozac 30 mg and so far she has no tremors, shakes or any EPS.  She is also taking Requip prescribed by her PCP.  Patient like to keep the current medication.   Past Psychiatric History: H/Odepression started in 1989. SawDr. Wendall Mola. Took klonopincaused thinning of hair and Wellbutrin did not help. Took Effexor for a long timebutstopped on her own. No h/osuicidal attempt, inpatient treatment, paranoia, hallucination, aggressive behavior, mania or psychosis.   Psychiatric Specialty Exam: Physical Exam  Review of Systems  Weight 158 lb  (71.7 kg).There is no height or weight on file to calculate BMI.  General Appearance: NA  Eye Contact:  NA  Speech:  Clear and Coherent  Volume:  Normal  Mood:  Euthymic  Affect:  NA  Thought Process:  Goal Directed  Orientation:  Full (Time, Place, and Person)  Thought Content:  WDL  Suicidal Thoughts:  No  Homicidal Thoughts:  No  Memory:  Immediate;   Good Recent;   Good Remote;   Good  Judgement:  Good  Insight:  Present  Psychomotor Activity:  NA  Concentration:  Concentration: Good and Attention Span: Good  Recall:  Good  Fund of Knowledge:  Good  Language:  Good  Akathisia:  No  Handed:  Right  AIMS (if indicated):     Assets:  Communication Skills Desire for Improvement Housing Resilience Social Support  ADL's:  Intact  Cognition:  WNL  Sleep:   10 hrs      Assessment and Plan: Major depressive disorder, recurrent.  Patient is stable on Prozac 30 mg.  Recently she is using crutches because she hurt her foot but hoping to start gardening once she is able to walk.  Encourage reading, listening music that she enjoys.  She is not interested in therapy.  Continue Prozac 30 mg daily.  Discussed medication side effects and benefits.  Recommended to call us back if she has any question of any concern.  Follow-up in 3 months.  Follow Up Instructions:    I discussed the assessment and treatment plan with the patient. The patient was provided an opportunity to  ask questions and all were answered. The patient agreed with the plan and demonstrated an understanding of the instructions.   The patient was advised to call back or seek an in-person evaluation if the symptoms worsen or if the condition fails to improve as anticipated.  I provided 13 minutes of non-face-to-face time during this encounter.   Cleotis Nipper, MD

## 2020-07-03 ENCOUNTER — Telehealth (HOSPITAL_COMMUNITY): Payer: Self-pay | Admitting: *Deleted

## 2020-07-03 NOTE — Telephone Encounter (Signed)
Opened in error

## 2020-07-07 ENCOUNTER — Other Ambulatory Visit: Payer: Self-pay | Admitting: Family Medicine

## 2020-07-07 ENCOUNTER — Ambulatory Visit (INDEPENDENT_AMBULATORY_CARE_PROVIDER_SITE_OTHER): Payer: Medicare Other | Admitting: Family Medicine

## 2020-07-07 ENCOUNTER — Other Ambulatory Visit: Payer: Self-pay

## 2020-07-07 ENCOUNTER — Encounter: Payer: Self-pay | Admitting: Family Medicine

## 2020-07-07 ENCOUNTER — Ambulatory Visit (INDEPENDENT_AMBULATORY_CARE_PROVIDER_SITE_OTHER): Payer: Medicare Other

## 2020-07-07 VITALS — BP 132/68 | HR 68 | Temp 97.9°F | Ht 65.5 in | Wt 152.6 lb

## 2020-07-07 DIAGNOSIS — M25572 Pain in left ankle and joints of left foot: Secondary | ICD-10-CM | POA: Diagnosis not present

## 2020-07-07 DIAGNOSIS — M7989 Other specified soft tissue disorders: Secondary | ICD-10-CM | POA: Diagnosis not present

## 2020-07-07 NOTE — Patient Instructions (Signed)
Ankle Exercises Ask your health care provider which exercises are safe for you. Do exercises exactly as told by your health care provider and adjust them as directed. It is normal to feel mild stretching, pulling, tightness, or mild discomfort as you do these exercises. Stop right away if you feel sudden pain or your pain gets worse. Do not begin these exercises until told by your health care provider. Stretching and range-of-motion exercises These exercises warm up your muscles and joints and improve the movement and flexibility of your ankle. These exercises may also help to relieve pain. Dorsiflexion/plantar flexion 1. Sit with your __________ knee straight or bent. Do not rest your foot on anything. 2. Flex your __________ ankle to tilt the top of your foot toward your shin. This is called dorsiflexion. 3. Hold this position for __________ seconds. 4. Point your toes downward to tilt the top of your foot away from your shin. This is called plantar flexion. 5. Hold this position for __________ seconds. Repeat __________ times. Complete this exercise __________ times a day.   Ankle alphabet 1. Sit with your __________ foot supported at your lower leg. ? Do not rest your foot on anything. ? Make sure your foot has room to move freely. 2. Think of your __________ foot as a paintbrush: ? Move your foot to trace each letter of the alphabet in the air. Keep your hip and knee still while you trace the letters. Trace every letter from A to Z. ? Make the letters as large as you can without causing or increasing any discomfort. Repeat __________ times. Complete this exercise __________ times a day.   Passive ankle dorsiflexion This is an exercise in which something or someone moves your ankle for you. You do not move it yourself. 1. Sit on a chair that is placed on a non-carpeted surface. 2. Place your __________ foot on the floor, directly under your __________ knee. Extend your __________ leg for  support. 3. Keeping your heel down, slide your __________ foot back toward the chair until you feel a stretch at your ankle or calf. If you do not feel a stretch, slide your buttocks forward to the edge of the chair while keeping your heel down. 4. Hold this stretch for __________ seconds. Repeat __________ times. Complete this exercise __________ times a day. Strengthening exercises These exercises build strength and endurance in your ankle. Endurance is the ability to use your muscles for a long time, even after they get tired. Dorsiflexors These are muscles that lift your foot up. 1. Secure a rubber exercise band or tube to an object, such as a table leg, that will stay still when the band is pulled. Secure the other end around your __________ foot. 2. Sit on the floor, facing the object with your __________ leg extended. The band or tube should be slightly tense when your foot is relaxed. 3. Slowly flex your __________ ankle and toes to bring your foot toward your shin. 4. Hold this position for __________ seconds. 5. Slowly return your foot to the starting position, controlling the band as you do that. Repeat __________ times. Complete this exercise __________ times a day.   Plantar flexors These are muscles that push your foot down. 1. Sit on the floor with your __________ leg extended. 2. Loop a rubber exercise band or tube around the ball of your __________ foot. The ball of your foot is on the walking surface, right under your toes. The band or tube should be   slightly tense when your foot is relaxed. 3. Slowly point your toes downward, pushing them away from you. 4. Hold this position for __________ seconds. 5. Slowly release the tension in the band or tube, controlling smoothly until your foot is back in the starting position. Repeat __________ times. Complete this exercise __________ times a day.   Towel curls 1. Sit in a chair on a non-carpeted surface, and put your feet on the  floor. 2. Place a towel in front of your feet. If told by your health care provider, add a __________ pound weight to the end of the towel. 3. Keeping your heel on the floor, put your __________ foot on the towel. 4. Pull the towel toward you by grabbing the towel with your toes and curling them under. Keep your heel on the floor. 5. Let your toes relax. 6. Grab the towel again. Keep pulling the towel until it is completely underneath your foot. Repeat __________ times. Complete this exercise __________ times a day.   Standing plantar flexion This is an exercise in which you use your toes to lift your body's weight while standing. 1. Stand with your feet shoulder-width apart. 2. Keep your weight spread evenly over the width of your feet while you rise up on your toes. Use a wall or table to steady yourself if needed, but try not to use it for support. 3. If this exercise is too easy, try these options: ? Shift your weight toward your __________ leg until you feel challenged. ? If told by your health care provider, lift your uninjured leg off the floor. 4. Hold this position for __________ seconds. Repeat __________ times. Complete this exercise __________ times a day.   Tandem walking 1. Stand with one foot directly in front of the other. 2. Slowly raise your back foot up, lifting your heel before your toes, and place it directly in front of your other foot. 3. Continue to walk in this heel-to-toe way for __________ or for as long as told by your health care provider. Have a countertop or wall nearby to use if needed to keep your balance, but try not to hold onto anything for support. Repeat __________ times. Complete this exercise __________ times a day. This information is not intended to replace advice given to you by your health care provider. Make sure you discuss any questions you have with your health care provider. Document Revised: 11/08/2017 Document Reviewed: 11/10/2017 Elsevier  Patient Education  2021 [employer]  Ankle Exercises Ask your health care provider which exercises are safe for you. Do exercises exactly as told by your health care provider and adjust them as directed. It is normal to feel mild stretching, pulling, tightness, or mild discomfort as you do these exercises. Stop right away if you feel sudden pain or your pain gets worse. Do not begin these exercises until told by your health care provider. Stretching and range-of-motion exercises These exercises warm up your muscles and joints and improve the movement and flexibility of your ankle. These exercises may also help to relieve pain. Dorsiflexion/plantar flexion 6. Sit with your __________ knee straight or bent. Do not rest your foot on anything. 7. Flex your __________ ankle to tilt the top of your foot toward your shin. This is called dorsiflexion. 8. Hold this position for __________ seconds. 9. Point your toes downward to tilt the top of your foot away from your shin. This is called plantar flexion. 10. Hold this position for __________ seconds. Repeat  __________ times. Complete this exercise __________ times a day.   Ankle alphabet 3. Sit with your __________ foot supported at your lower leg. ? Do not rest your foot on anything. ? Make sure your foot has room to move freely. 4. Think of your __________ foot as a paintbrush: ? Move your foot to trace each letter of the alphabet in the air. Keep your hip and knee still while you trace the letters. Trace every letter from A to Z. ? Make the letters as large as you can without causing or increasing any discomfort. Repeat __________ times. Complete this exercise __________ times a day.   Passive ankle dorsiflexion This is an exercise in which something or someone moves your ankle for you. You do not move it yourself. 5. Sit on a chair that is placed on a non-carpeted surface. 6. Place your __________ foot on the floor, directly under your  __________ knee. Extend your __________ leg for support. 7. Keeping your heel down, slide your __________ foot back toward the chair until you feel a stretch at your ankle or calf. If you do not feel a stretch, slide your buttocks forward to the edge of the chair while keeping your heel down. 8. Hold this stretch for __________ seconds. Repeat __________ times. Complete this exercise __________ times a day. Strengthening exercises These exercises build strength and endurance in your ankle. Endurance is the ability to use your muscles for a long time, even after they get tired. Dorsiflexors These are muscles that lift your foot up. 6. Secure a rubber exercise band or tube to an object, such as a table leg, that will stay still when the band is pulled. Secure the other end around your __________ foot. 7. Sit on the floor, facing the object with your __________ leg extended. The band or tube should be slightly tense when your foot is relaxed. 8. Slowly flex your __________ ankle and toes to bring your foot toward your shin. 9. Hold this position for __________ seconds. 10. Slowly return your foot to the starting position, controlling the band as you do that. Repeat __________ times. Complete this exercise __________ times a day.   Plantar flexors These are muscles that push your foot down. 6. Sit on the floor with your __________ leg extended. 7. Loop a rubber exercise band or tube around the ball of your __________ foot. The ball of your foot is on the walking surface, right under your toes. The band or tube should be slightly tense when your foot is relaxed. 8. Slowly point your toes downward, pushing them away from you. 9. Hold this position for __________ seconds. 10. Slowly release the tension in the band or tube, controlling smoothly until your foot is back in the starting position. Repeat __________ times. Complete this exercise __________ times a day.   Towel curls 7. Sit in a chair on a  non-carpeted surface, and put your feet on the floor. 8. Place a towel in front of your feet. If told by your health care provider, add a __________ pound weight to the end of the towel. 9. Keeping your heel on the floor, put your __________ foot on the towel. 10. Pull the towel toward you by grabbing the towel with your toes and curling them under. Keep your heel on the floor. 11. Let your toes relax. 12. Grab the towel again. Keep pulling the towel until it is completely underneath your foot. Repeat __________ times. Complete this exercise __________ times a day.  Standing plantar flexion This is an exercise in which you use your toes to lift your body's weight while standing. 5. Stand with your feet shoulder-width apart. 6. Keep your weight spread evenly over the width of your feet while you rise up on your toes. Use a wall or table to steady yourself if needed, but try not to use it for support. 7. If this exercise is too easy, try these options: ? Shift your weight toward your __________ leg until you feel challenged. ? If told by your health care provider, lift your uninjured leg off the floor. 8. Hold this position for __________ seconds. Repeat __________ times. Complete this exercise __________ times a day.   Tandem walking 4. Stand with one foot directly in front of the other. 5. Slowly raise your back foot up, lifting your heel before your toes, and place it directly in front of your other foot. 6. Continue to walk in this heel-to-toe way for __________ or for as long as told by your health care provider. Have a countertop or wall nearby to use if needed to keep your balance, but try not to hold onto anything for support. Repeat __________ times. Complete this exercise __________ times a day. This information is not intended to replace advice given to you by your health care provider. Make sure you discuss any questions you have with your health care provider. Document Revised:  11/08/2017 Document Reviewed: 11/10/2017 Elsevier Patient Education  2021 [employer].

## 2020-07-07 NOTE — Progress Notes (Signed)
Christina Guerrero DOB: May 16, 1940 Encounter date: 07/07/2020  This is a 80 y.o. female who presents with Chief Complaint  Patient presents with  . Ankle Injury    Patient complains of left ankle pain and swelling x1 month, patient states she was sitting with legs crossed, got up and foot was asleep  . Rash    Patient wanted to let PCP know she feels she may have had shingles in December 2021 due to rash noted in the center of her chest, states she did not have an evaluation by a physician    History of present illness: Left ankle is better - but she is tired of kids keeping on her about it. Still sensitive to touch and still with some bruising. Had a lot of swelling and bruising when it happened. Did it on easter. Inversion injury. Gets tired with standing. Not pain, just needs to rest. Swelling has improved, but still there. Keeps elevated. Hurts most in midfoot when she did it.   One evening in December - started scratching on chest - in minutes thought she had hives. Didn't pay attention until it didn't go away. Was across whole chest. Little blisters, raised bumps. Not pustules. Painful to touch. Scratching helped but then made it worse. Used powder. No fevers, chills.   Allergies  Allergen Reactions  . Other     propolene glycol   Current Meds  Medication Sig  . COVID-19 mRNA vaccine, Pfizer, 30 MCG/0.3ML injection INJECT AS DIRECTED  . FLUoxetine (PROZAC) 10 MG capsule Take three capsule daily  . NAPROXEN PO Take by mouth.  Marland Kitchen omeprazole (PRILOSEC) 20 MG capsule TAKE 1 CAPSULE(20 MG) BY MOUTH DAILY  . oxybutynin (DITROPAN) 5 MG tablet TAKE 1 TABLET(5 MG) BY MOUTH EVERY 8 HOURS AS NEEDED FOR BLADDER SPASMS  . rOPINIRole (REQUIP) 0.5 MG tablet TAKE 1 TABLET(0.5 MG) BY MOUTH AT BEDTIME  . rOPINIRole (REQUIP) 2 MG tablet Take 1 tablet (2 mg total) by mouth at bedtime. Appointment required for further refills.  . rosuvastatin (CRESTOR) 20 MG tablet TAKE 1 TABLET BY MOUTH DAILY  .  triamcinolone cream (KENALOG) 0.1 % Apply 1 application topically 2 (two) times daily.    Review of Systems  Constitutional: Negative for chills, fatigue and fever.  Respiratory: Negative for cough, chest tightness, shortness of breath and wheezing.   Cardiovascular: Negative for chest pain, palpitations and leg swelling.  Musculoskeletal: Positive for arthralgias (left ankle; see hpi) and joint swelling (left ankle; see hpi).  Skin: Positive for color change (ankle bruising/foot bruising) and rash (has now resolved).    Objective:  BP 132/68 (BP Location: Left Arm, Patient Position: Sitting, Cuff Size: Normal)   Pulse 68   Temp 97.9 F (36.6 C) (Oral)   Ht 5' 5.5" (1.664 m)   Wt 152 lb 9.6 oz (69.2 kg)   SpO2 96%   BMI 25.01 kg/m   Weight: 152 lb 9.6 oz (69.2 kg)   BP Readings from Last 3 Encounters:  07/07/20 132/68  02/04/20 138/60  01/26/20 128/74   Wt Readings from Last 3 Encounters:  07/07/20 152 lb 9.6 oz (69.2 kg)  02/04/20 158 lb 8 oz (71.9 kg)  01/26/20 158 lb 9.6 oz (71.9 kg)    Physical Exam Constitutional:      General: She is not in acute distress.    Appearance: She is well-developed.  Cardiovascular:     Rate and Rhythm: Normal rate and regular rhythm.     Heart sounds: Normal heart  sounds. No murmur heard. No friction rub.  Pulmonary:     Effort: Pulmonary effort is normal. No respiratory distress.     Breath sounds: Normal breath sounds. No wheezing or rales.  Musculoskeletal:     Right lower leg: No edema.     Left lower leg: No edema.     Comments: There is edema of the left ankle, most noted around and below the lateral malleolus.  There is tenderness of the lateral malleolus as well as residual ecchymosis.  She also has ecchymosis noted on the dorsal side of the toes third through fifth toes.  There is no tenderness with palpation of the toes or midfoot.  There is no apparent laxity of the ankle that is appreciable.  There is resolving ecchymosis  up to mid tibia anteriorly.  She does have discomfort with full flexion or extension of ankle.  She does have pain with inversion of ankle.  Neurological:     Mental Status: She is alert and oriented to person, place, and time.  Psychiatric:        Behavior: Behavior normal.     Assessment/Plan  1. Acute left ankle pain Start with xray; can recommend brace/support pending this result. She was given exercises for strengthening/stretching of ankle. - DG Ankle Complete Left; Future    Return if symptoms worsen or fail to improve.    Theodis Shove, MD

## 2020-07-10 ENCOUNTER — Telehealth (HOSPITAL_COMMUNITY): Payer: Self-pay

## 2020-07-10 NOTE — Telephone Encounter (Signed)
She is getting Requip from her PCP since last year.

## 2020-07-10 NOTE — Telephone Encounter (Signed)
Medication refill request - Refill request for pt's prescribed Ropinirole 2 mg, 1 at bedtime, #90 received from pt's [employer]. Is this now being filled by her primary care.

## 2020-08-05 ENCOUNTER — Other Ambulatory Visit: Payer: Self-pay | Admitting: Family Medicine

## 2020-08-05 DIAGNOSIS — K219 Gastro-esophageal reflux disease without esophagitis: Secondary | ICD-10-CM

## 2020-08-09 ENCOUNTER — Telehealth: Payer: Self-pay | Admitting: Family Medicine

## 2020-08-09 NOTE — Telephone Encounter (Signed)
Left message for patient to call back and schedule Medicare Annual Wellness Visit (AWV) either virtually or in office.   Last AWV 08/13/19 please schedule at anytime with [facility] Nurse Health Advisor 1 or 2   This should be a 45 minute visit.  

## 2020-08-28 ENCOUNTER — Other Ambulatory Visit: Payer: Self-pay | Admitting: Family Medicine

## 2020-08-28 DIAGNOSIS — G2581 Restless legs syndrome: Secondary | ICD-10-CM

## 2020-09-11 ENCOUNTER — Telehealth: Payer: Self-pay | Admitting: Family Medicine

## 2020-09-11 NOTE — Telephone Encounter (Signed)
Left message for patient to call back and schedule Medicare Annual Wellness Visit (AWV) either virtually or in office.   Last AWV 08/13/19 please schedule at anytime with [facility] Nurse Health Advisor 1 or 2   This should be a 45 minute visit.  

## 2020-09-14 ENCOUNTER — Encounter (HOSPITAL_COMMUNITY): Payer: Self-pay | Admitting: Psychiatry

## 2020-09-14 ENCOUNTER — Telehealth (INDEPENDENT_AMBULATORY_CARE_PROVIDER_SITE_OTHER): Payer: Medicare Other | Admitting: Psychiatry

## 2020-09-14 ENCOUNTER — Other Ambulatory Visit: Payer: Self-pay

## 2020-09-14 ENCOUNTER — Other Ambulatory Visit: Payer: Self-pay | Admitting: Family Medicine

## 2020-09-14 DIAGNOSIS — F331 Major depressive disorder, recurrent, moderate: Secondary | ICD-10-CM

## 2020-09-14 DIAGNOSIS — G2581 Restless legs syndrome: Secondary | ICD-10-CM

## 2020-09-14 MED ORDER — FLUOXETINE HCL 10 MG PO CAPS: ORAL_CAPSULE | ORAL | 2 refills | Status: DC

## 2020-09-14 MED ORDER — ROPINIROLE HCL 2 MG PO TABS: 2.0000 mg | ORAL_TABLET | Freq: Every day | ORAL | 1 refills | Status: DC

## 2020-09-14 NOTE — Progress Notes (Signed)
Virtual Visit via Telephone Note  I connected with Christina Guerrero on 09/14/20 at 10:20 AM EDT by telephone and verified that I am speaking with the correct person using two identifiers.  Location: Patient: Home Provider: Office   I discussed the limitations, risks, security and privacy concerns of performing an evaluation and management service by telephone and the availability of in person appointments. I also discussed with the patient that there may be a patient responsible charge related to this service. The patient expressed understanding and agreed to proceed.   History of Present Illness: Patient is evaluated by phone session.  She fractured her tibia back in May but now much better and walking without any support.  Overall she feels her depression is good however lately she has issues getting her Requip 2 mg from the physician.  She has to pay out of pocket.  Her Requip is given by her PCP.  She denies any crying spells or any feeling of hopelessness or worthlessness.  Her appetite is okay.  Energy level is okay.  She excited about going to [place] to visit her daughter and also she will attend her husband's fraternity brother's reunion.  She has no tremors, shakes or any EPS.  She denies any negative or ruminative thoughts.  She does not feel she need any therapist that things are going well and she is adjusting much better.  She feels her symptoms are manageable with the help of medication.     Past Psychiatric History:  H/O depression started in 1989.  Saw Dr. Wendall Mola. Took klonopin caused thinning of hair and Wellbutrin did not help.  Took Effexor for a long time but stopped on her own.  No h/o suicidal attempt, inpatient treatment, paranoia, hallucination, aggressive behavior, mania or psychosis.      Psychiatric Specialty Exam: Physical Exam  Review of Systems  Weight 152 lb (68.9 kg).There is no height or weight on file to calculate BMI.  General Appearance: NA  Eye  Contact:  NA  Speech:  Clear and Coherent  Volume:  Normal  Mood:  Euthymic  Affect:  NA  Thought Process:  Goal Directed  Orientation:  Full (Time, Place, and Person)  Thought Content:  WDL  Suicidal Thoughts:  No  Homicidal Thoughts:  No  Memory:  Immediate;   Good Recent;   Good Remote;   Good  Judgement:  Good  Insight:  Present  Psychomotor Activity:  NA  Concentration:  Concentration: Good and Attention Span: Good  Recall:  Good  Fund of Knowledge:  Good  Language:  Good  Akathisia:  No  Handed:  Right  AIMS (if indicated):     Assets:  Communication Skills Desire for Improvement Housing Resilience Social Support  ADL's:  Intact  Cognition:  WNL  Sleep:   ok      Assessment and Plan: Major depressive disorder, recurrent.  Patient is stable on Prozac 30 mg daily.  She is not interested in therapy.  She reported no side effects from the medication.  She liked as to have sent a message to her PCP so she can get the Requip 2 mg.  Her Requip is given by her PCP.  Recommended to call us back if she has any question or any concern.  Follow-up in 3 months.  We will forward our note and send a message to her PCP for Requip.  Follow Up Instructions:    I discussed the assessment and treatment plan with the patient. The  patient was provided an opportunity to ask questions and all were answered. The patient agreed with the plan and demonstrated an understanding of the instructions.   The patient was advised to call back or seek an in-person evaluation if the symptoms worsen or if the condition fails to improve as anticipated.  I provided 15 minutes of non-face-to-face time during this encounter.   Cleotis Nipper, MD

## 2020-09-21 ENCOUNTER — Encounter: Payer: Self-pay | Admitting: Family Medicine

## 2020-09-21 NOTE — Telephone Encounter (Signed)
Noted  

## 2020-10-12 ENCOUNTER — Other Ambulatory Visit: Payer: Self-pay | Admitting: Family Medicine

## 2020-10-23 ENCOUNTER — Other Ambulatory Visit: Payer: Self-pay | Admitting: Family Medicine

## 2020-10-25 ENCOUNTER — Ambulatory Visit (INDEPENDENT_AMBULATORY_CARE_PROVIDER_SITE_OTHER): Payer: Medicare Other

## 2020-10-25 ENCOUNTER — Other Ambulatory Visit: Payer: Self-pay

## 2020-10-25 VITALS — BP 135/70 | HR 65 | Temp 98.2°F | Ht 60.0 in | Wt 150.0 lb

## 2020-10-25 DIAGNOSIS — Z780 Asymptomatic menopausal state: Secondary | ICD-10-CM

## 2020-10-25 DIAGNOSIS — Z0000 Encounter for general adult medical examination without abnormal findings: Secondary | ICD-10-CM | POA: Diagnosis not present

## 2020-10-25 NOTE — Patient Instructions (Signed)
Christina Guerrero , Thank you for taking time to come for your Medicare Wellness Visit. I appreciate your ongoing commitment to your health goals. Please review the following plan we discussed and let me know if I can assist you in the future.   Screening recommendations/referrals: Colonoscopy: no longer required  Mammogram: no longer required  Bone Density: referral 10/15/2020 Recommended yearly ophthalmology/optometry visit for glaucoma screening and checkup Recommended yearly dental visit for hygiene and checkup  Vaccinations: Influenza vaccine: due in fall 2022  Pneumococcal vaccine: completed series  Tdap vaccine: due upon injury  Shingles vaccine: will obtain local pharmacy     Advanced directives: copies in chart   Conditions/risks identified: none   Next appointment: 12/11/2020  1000am  [facility]    Preventive Care 65 Years and Older, Female Preventive care refers to lifestyle choices and visits with your health care provider that can promote health and wellness. What does preventive care include? A yearly physical exam. This is also called an annual well check. Dental exams once or twice a year. Routine eye exams. Ask your health care provider how often you should have your eyes checked. Personal lifestyle choices, including: Daily care of your teeth and gums. Regular physical activity. Eating a healthy diet. Avoiding tobacco and drug use. Limiting alcohol use. Practicing safe sex. Taking low-dose aspirin every day. Taking vitamin and mineral supplements as recommended by your health care provider. What happens during an annual well check? The services and screenings done by your health care provider during your annual well check will depend on your age, overall health, lifestyle risk factors, and family history of disease. Counseling  Your health care provider may ask you questions about your: Alcohol use. Tobacco use. Drug use. Emotional well-being. Home and  relationship well-being. Sexual activity. Eating habits. History of falls. Memory and ability to understand (cognition). Work and work Astronomer. Reproductive health. Screening  You may have the following tests or measurements: Height, weight, and BMI. Blood pressure. Lipid and cholesterol levels. These may be checked every 5 years, or more frequently if you are over 63 years old. Skin check. Lung cancer screening. You may have this screening every year starting at age 66 if you have a 30-pack-year history of smoking and currently smoke or have quit within the past 15 years. Fecal occult blood test (FOBT) of the stool. You may have this test every year starting at age 90. Flexible sigmoidoscopy or colonoscopy. You may have a sigmoidoscopy every 5 years or a colonoscopy every 10 years starting at age 4. Hepatitis C blood test. Hepatitis B blood test. Sexually transmitted disease (STD) testing. Diabetes screening. This is done by checking your blood sugar (glucose) after you have not eaten for a while (fasting). You may have this done every 1-3 years. Bone density scan. This is done to screen for osteoporosis. You may have this done starting at age 77. Mammogram. This may be done every 1-2 years. Talk to your health care provider about how often you should have regular mammograms. Talk with your health care provider about your test results, treatment options, and if necessary, the need for more tests. Vaccines  Your health care provider may recommend certain vaccines, such as: Influenza vaccine. This is recommended every year. Tetanus, diphtheria, and acellular pertussis (Tdap, Td) vaccine. You may need a Td booster every 10 years. Zoster vaccine. You may need this after age 61. Pneumococcal 13-valent conjugate (PCV13) vaccine. One dose is recommended after age 33. Pneumococcal polysaccharide (PPSV23) vaccine.  One dose is recommended after age 88. Talk to your health care provider  about which screenings and vaccines you need and how often you need them. This information is not intended to replace advice given to you by your health care provider. Make sure you discuss any questions you have with your health care provider. Document Released: 03/10/2015 Document Revised: 11/01/2015 Document Reviewed: 12/13/2014 Elsevier Interactive Patient Education  2017 [place] Prevention in the Home Falls can cause injuries. They can happen to people of all ages. There are many things you can do to make your home safe and to help prevent falls. What can I do on the outside of my home? Regularly fix the edges of walkways and driveways and fix any cracks. Remove anything that might make you trip as you walk through a door, such as a raised step or threshold. Trim any bushes or trees on the path to your home. Use bright outdoor lighting. Clear any walking paths of anything that might make someone trip, such as rocks or tools. Regularly check to see if handrails are loose or broken. Make sure that both sides of any steps have handrails. Any raised decks and porches should have guardrails on the edges. Have any leaves, snow, or ice cleared regularly. Use sand or salt on walking paths during winter. Clean up any spills in your garage right away. This includes oil or grease spills. What can I do in the bathroom? Use night lights. Install grab bars by the toilet and in the tub and shower. Do not use towel bars as grab bars. Use non-skid mats or decals in the tub or shower. If you need to sit down in the shower, use a plastic, non-slip stool. Keep the floor dry. Clean up any water that spills on the floor as soon as it happens. Remove soap buildup in the tub or shower regularly. Attach bath mats securely with double-sided non-slip rug tape. Do not have throw rugs and other things on the floor that can make you trip. What can I do in the bedroom? Use night lights. Make sure  that you have a light by your bed that is easy to reach. Do not use any sheets or blankets that are too big for your bed. They should not hang down onto the floor. Have a firm chair that has side arms. You can use this for support while you get dressed. Do not have throw rugs and other things on the floor that can make you trip. What can I do in the kitchen? Clean up any spills right away. Avoid walking on wet floors. Keep items that you use a lot in easy-to-reach places. If you need to reach something above you, use a strong step stool that has a grab bar. Keep electrical cords out of the way. Do not use floor polish or wax that makes floors slippery. If you must use wax, use non-skid floor wax. Do not have throw rugs and other things on the floor that can make you trip. What can I do with my stairs? Do not leave any items on the stairs. Make sure that there are handrails on both sides of the stairs and use them. Fix handrails that are broken or loose. Make sure that handrails are as long as the stairways. Check any carpeting to make sure that it is firmly attached to the stairs. Fix any carpet that is loose or worn. Avoid having throw rugs at the top or bottom of  the stairs. If you do have throw rugs, attach them to the floor with carpet tape. Make sure that you have a light switch at the top of the stairs and the bottom of the stairs. If you do not have them, ask someone to add them for you. What else can I do to help prevent falls? Wear shoes that: Do not have high heels. Have rubber bottoms. Are comfortable and fit you well. Are closed at the toe. Do not wear sandals. If you use a stepladder: Make sure that it is fully opened. Do not climb a closed stepladder. Make sure that both sides of the stepladder are locked into place. Ask someone to hold it for you, if possible. Clearly mark and make sure that you can see: Any grab bars or handrails. First and last steps. Where the edge of  each step is. Use tools that help you move around (mobility aids) if they are needed. These include: Canes. Walkers. Scooters. Crutches. Turn on the lights when you go into a dark area. Replace any light bulbs as soon as they burn out. Set up your furniture so you have a clear path. Avoid moving your furniture around. If any of your floors are uneven, fix them. If there are any pets around you, be aware of where they are. Review your medicines with your doctor. Some medicines can make you feel dizzy. This can increase your chance of falling. Ask your doctor what other things that you can do to help prevent falls. This information is not intended to replace advice given to you by your health care provider. Make sure you discuss any questions you have with your health care provider. Document Released: 12/08/2008 Document Revised: 07/20/2015 Document Reviewed: 03/18/2014 Elsevier Interactive Patient Education  2017 [employer].

## 2020-10-25 NOTE — Progress Notes (Signed)
Subjective:   Christina Guerrero is a 80 y.o. female who presents for Medicare Annual (Subsequent) preventive examination.  Review of Systems    N/a       Objective:    There were no vitals filed for this visit. There is no height or weight on file to calculate BMI.  Advanced Directives 11/25/2019 12/09/2017 12/09/2017 03/27/2015 12/23/2014  Does Patient Have a Medical Advance Directive? Yes Yes Yes Yes Yes  Type of Estate agent of [place];Living will - - Living will;Healthcare Power of [employer] of [place];Living will  Does patient want to make changes to medical advance directive? - - - - No - Patient declined  Copy of Healthcare Power of Attorney in Chart? - - - - No - copy requested  Some encounter information is confidential and restricted. Go to Review Flowsheets activity to see all data.    Current Medications (verified) Outpatient Encounter Medications as of 10/25/2020  Medication Sig   COVID-19 mRNA vaccine, Pfizer, 30 MCG/0.3ML injection INJECT AS DIRECTED   FLUoxetine (PROZAC) 10 MG capsule Take three capsule daily   NAPROXEN PO Take by mouth.   omeprazole (PRILOSEC) 20 MG capsule TAKE 1 CAPSULE(20 MG) BY MOUTH DAILY   oxybutynin (DITROPAN) 5 MG tablet TAKE 1 TABLET(5 MG) BY MOUTH EVERY 8 HOURS AS NEEDED FOR BLADDER SPASMS   rOPINIRole (REQUIP) 0.5 MG tablet TAKE 1 TABLET(0.5 MG) BY MOUTH AT BEDTIME   rOPINIRole (REQUIP) 2 MG tablet Take 1 tablet (2 mg total) by mouth at bedtime.   rosuvastatin (CRESTOR) 20 MG tablet TAKE 1 TABLET BY MOUTH DAILY   triamcinolone cream (KENALOG) 0.1 % Apply 1 application topically 2 (two) times daily.   No facility-administered encounter medications on file as of 10/25/2020.    Allergies (verified) Other   History: Past Medical History:  Diagnosis Date   Arthritis    Chicken pox    Depression    GERD (gastroesophageal reflux disease)    High cholesterol    Restless leg syndrome    Sleep  apnea    borderline   Urine incontinence    UTI (urinary tract infection)    Past Surgical History:  Procedure Laterality Date   APPENDECTOMY  02/2015   LAPROSCOPIC    COLONOSCOPY  2012   LAPAROSCOPIC APPENDECTOMY N/A 03/27/2015   Procedure: APPENDECTOMY LAPAROSCOPIC;  Surgeon: Emelia Loron, MD;  Location: [facility] OR;  Service: General;  Laterality: N/A;   TONSILLECTOMY AND ADENOIDECTOMY  1954   Family History  Problem Relation Age of Onset   Hyperlipidemia Mother    Hypertension Mother    Mental illness Mother    Stroke Mother 60   Thyroid disease Mother    Depression Mother    Kidney disease Father    Mental illness Father    Alcoholism Father    Hepatitis Father    Sudden death Father    Alcohol abuse Father    Stroke Maternal Grandfather    Social History   Socioeconomic History   Marital status: Widowed    Spouse name: Not on file   Number of children: Not on file   Years of education: Not on file   Highest education level: Not on file  Occupational History   Not on file  Tobacco Use   Smoking status: Former   Smokeless tobacco: Never   Tobacco comments:    QUIT SMOKING  IN 1989  Vaping Use   Vaping Use: Never used  Substance and Sexual Activity  Alcohol use: Yes    Alcohol/week: 2.0 standard drinks    Types: 2 Glasses of wine per week    Comment: glass of wine once per day   Drug use: No   Sexual activity: Not Currently  Other Topics Concern   Not on file  Social History Narrative   Work or School: retired Environmental health practitioner      Home Situation: living with spouse      Spiritual Beliefs: none      Lifestyle: no regular exercise; diet is so so            Social Determinants of Corporate investment banker Strain: Not on file  Food Insecurity: Not on file  Transportation Needs: Not on file  Physical Activity: Not on file  Stress: Not on file  Social Connections: Not on file    Tobacco Counseling Counseling given: Not  Answered Tobacco comments: QUIT SMOKING  IN 1989   Clinical Intake:                 Diabetic?no         Activities of Daily Living No flowsheet data found.  Patient Care Team: Wynn Banker, MD as PCP - General (Family Medicine) Lolly Mustache, Phillips Grout, MD as Consulting Physician (Psychiatry)  Indicate any recent Medical Services you may have received from other than [facility] providers in the past year (date may be approximate).     Assessment:   This is a routine wellness examination for Christina Guerrero.  Hearing/Vision screen No results found.  Dietary issues and exercise activities discussed:     Goals Addressed   None    Depression Screen PHQ 2/9 Scores 08/13/2019 03/13/2019 12/09/2017 06/03/2017 11/29/2016 11/29/2016 12/25/2015  PHQ - 2 Score 3 4 6 5 3 3 3   PHQ- 9 Score 13 12 14 12 14  - -    Fall Risk Fall Risk  08/13/2019 01/20/2019 01/13/2018 12/09/2017 11/29/2016  Falls in the past year? 0 0 0 No Yes  Comment - Emmi Telephone Survey: data to providers prior to load Emmi Telephone Survey: data to providers prior to load - -  Number falls in past yr: 0 - - - 1  Injury with Fall? 0 - - - Yes    FALL RISK PREVENTION PERTAINING TO THE HOME:  Any stairs in or around the home? No  If so, are there any without handrails? No  Home free of loose throw rugs in walkways, pet beds, electrical cords, etc? Yes  Adequate lighting in your home to reduce risk of falls? Yes   ASSISTIVE DEVICES UTILIZED TO PREVENT FALLS:  Life alert? No  Use of a cane, walker or w/c? No  Grab bars in the bathroom? Yes  Shower chair or bench in shower? Yes  Elevated toilet seat or a handicapped toilet? No   TIMED UP AND GO:  Was the test performed? Yes .  Length of time to ambulate 10 feet: 7 sec.   Gait steady and fast without use of assistive device  Cognitive Function: Normal cognitive status assessed by direct observation by this Nurse Health Advisor. No abnormalities found.    MMSE - Mini Mental State Exam 12/09/2017  Not completed: (No Data)        Immunizations Immunization History  Administered Date(s) Administered   Fluad Quad(high Dose 65+) 01/04/2019, 01/05/2020   Influenza, High Dose Seasonal PF 12/23/2014, 10/31/2015, 11/29/2016, 12/09/2017   Influenza-Unspecified 01/05/2013   PFIZER(Purple Top)SARS-COV-2 Vaccination 03/20/2019, 04/10/2019, 12/15/2019  Pneumococcal Conjugate-13 12/23/2014   Pneumococcal Polysaccharide-23 03/16/2009   Tdap 03/16/2009    TDAP status: Up to date  Flu Vaccine status: Up to date  Pneumococcal vaccine status: Up to date  Covid-19 vaccine status: Completed vaccines  Qualifies for Shingles Vaccine? Yes   Zostavax completed Yes   Shingrix Completed?: No.    Education has been provided regarding the importance of this vaccine. Patient has been advised to call insurance company to determine out of pocket expense if they have not yet received this vaccine. Advised may also receive vaccine at local pharmacy or Health Dept. Verbalized acceptance and understanding.  Screening Tests Health Maintenance  Topic Date Due   Hepatitis C Screening  Never done   Zoster Vaccines- Shingrix (1 of 2) Never done   TETANUS/TDAP  03/17/2019   COVID-19 Vaccine (4 - Booster for Pfizer series) 04/16/2020   INFLUENZA VACCINE  09/25/2020   DEXA SCAN  12/22/2024 (Originally 11/07/2007)   PNA vac Low Risk Adult  Completed   HPV VACCINES  Aged Out    Health Maintenance  Health Maintenance Due  Topic Date Due   Hepatitis C Screening  Never done   Zoster Vaccines- Shingrix (1 of 2) Never done   TETANUS/TDAP  03/17/2019   COVID-19 Vaccine (4 - Booster for Pfizer series) 04/16/2020   INFLUENZA VACCINE  09/25/2020    Colorectal cancer screening: No longer required.   Mammogram status: No longer required due to age.  Bone Density status: Ordered 10/25/2020. Pt provided with contact info and advised to call to schedule appt.  Lung  Cancer Screening: (Low Dose CT Chest recommended if Age 14-80 years, 30 pack-year currently smoking OR have quit w/in 15years.) does not qualify.   Lung Cancer Screening Referral: n/a  Additional Screening:  Hepatitis C Screening: does qualify;   Vision Screening: Recommended annual ophthalmology exams for early detection of glaucoma and other disorders of the eye. Is the patient up to date with their annual eye exam?  No  Who is the provider or what is the name of the office in which the patient attends annual eye exams? Dr.Snipe If pt is not established with a provider, would they like to be referred to a provider to establish care? No .   Dental Screening: Recommended annual dental exams for proper oral hygiene  Community Resource Referral / Chronic Care Management: CRR required this visit?  No   CCM required this visit?  No      Plan:     I have personally reviewed and noted the following in the patient's chart:   Medical and social history Use of alcohol, tobacco or illicit drugs  Current medications and supplements including opioid prescriptions.  Functional ability and status Nutritional status Physical activity Advanced directives List of other physicians Hospitalizations, surgeries, and ER visits in previous 12 months Vitals Screenings to include cognitive, depression, and falls Referrals and appointments  In addition, I have reviewed and discussed with patient certain preventive protocols, quality metrics, and best practice recommendations. A written personalized care plan for preventive services as well as general preventive health recommendations were provided to patient.     March Rummage, LPN   08/11/735   Nurse Notes: none

## 2020-12-11 ENCOUNTER — Encounter: Payer: Self-pay | Admitting: Family Medicine

## 2020-12-11 ENCOUNTER — Other Ambulatory Visit: Payer: Self-pay

## 2020-12-11 ENCOUNTER — Ambulatory Visit (INDEPENDENT_AMBULATORY_CARE_PROVIDER_SITE_OTHER): Payer: Medicare Other | Admitting: Family Medicine

## 2020-12-11 VITALS — BP 122/78 | HR 67 | Temp 98.5°F | Ht 65.5 in | Wt 149.2 lb

## 2020-12-11 DIAGNOSIS — F339 Major depressive disorder, recurrent, unspecified: Secondary | ICD-10-CM

## 2020-12-11 DIAGNOSIS — E559 Vitamin D deficiency, unspecified: Secondary | ICD-10-CM

## 2020-12-11 DIAGNOSIS — R251 Tremor, unspecified: Secondary | ICD-10-CM

## 2020-12-11 DIAGNOSIS — Z23 Encounter for immunization: Secondary | ICD-10-CM | POA: Diagnosis not present

## 2020-12-11 DIAGNOSIS — E538 Deficiency of other specified B group vitamins: Secondary | ICD-10-CM

## 2020-12-11 DIAGNOSIS — R739 Hyperglycemia, unspecified: Secondary | ICD-10-CM | POA: Diagnosis not present

## 2020-12-11 DIAGNOSIS — R5383 Other fatigue: Secondary | ICD-10-CM

## 2020-12-11 DIAGNOSIS — K219 Gastro-esophageal reflux disease without esophagitis: Secondary | ICD-10-CM | POA: Diagnosis not present

## 2020-12-11 DIAGNOSIS — E785 Hyperlipidemia, unspecified: Secondary | ICD-10-CM

## 2020-12-11 DIAGNOSIS — G2581 Restless legs syndrome: Secondary | ICD-10-CM

## 2020-12-11 LAB — CBC WITH DIFFERENTIAL/PLATELET
Basophils Absolute: 0.1 10*3/uL (ref 0.0–0.1)
Basophils Relative: 0.9 % (ref 0.0–3.0)
Eosinophils Absolute: 0.1 10*3/uL (ref 0.0–0.7)
Eosinophils Relative: 1.7 % (ref 0.0–5.0)
HCT: 43.8 % (ref 36.0–46.0)
Hemoglobin: 14.6 g/dL (ref 12.0–15.0)
Lymphocytes Relative: 22.8 % (ref 12.0–46.0)
Lymphs Abs: 1.3 10*3/uL (ref 0.7–4.0)
MCHC: 33.2 g/dL (ref 30.0–36.0)
MCV: 97 fl (ref 78.0–100.0)
Monocytes Absolute: 0.2 10*3/uL (ref 0.1–1.0)
Monocytes Relative: 4.3 % (ref 3.0–12.0)
Neutro Abs: 4 10*3/uL (ref 1.4–7.7)
Neutrophils Relative %: 70.3 % (ref 43.0–77.0)
Platelets: 208 10*3/uL (ref 150.0–400.0)
RBC: 4.52 Mil/uL (ref 3.87–5.11)
RDW: 13.2 % (ref 11.5–15.5)
WBC: 5.7 10*3/uL (ref 4.0–10.5)

## 2020-12-11 LAB — LIPID PANEL
Cholesterol: 177 mg/dL (ref 0–200)
HDL: 60.4 mg/dL (ref >39.00)
LDL Cholesterol: 95 mg/dL (ref 0–99)
NonHDL: 116.54
Total CHOL/HDL Ratio: 3
Triglycerides: 110 mg/dL (ref 0.0–149.0)
VLDL: 22 mg/dL (ref 0.0–40.0)

## 2020-12-11 LAB — FOLATE: Folate: 4 ng/mL — ABNORMAL LOW (ref >5.9)

## 2020-12-11 LAB — COMPREHENSIVE METABOLIC PANEL
ALT: 12 U/L (ref 0–35)
AST: 20 U/L (ref 0–37)
Albumin: 4.6 g/dL (ref 3.5–5.2)
Alkaline Phosphatase: 83 U/L (ref 39–117)
BUN: 12 mg/dL (ref 6–23)
CO2: 29 mEq/L (ref 19–32)
Calcium: 9.5 mg/dL (ref 8.4–10.5)
Chloride: 103 mEq/L (ref 96–112)
Creatinine, Ser: 0.92 mg/dL (ref 0.40–1.20)
GFR: 59.81 mL/min — ABNORMAL LOW (ref >60.00)
Glucose, Bld: 88 mg/dL (ref 70–99)
Potassium: 3.8 mEq/L (ref 3.5–5.1)
Sodium: 141 mEq/L (ref 135–145)
Total Bilirubin: 0.7 mg/dL (ref 0.2–1.2)
Total Protein: 7.1 g/dL (ref 6.0–8.3)

## 2020-12-11 LAB — TSH: TSH: 3.1 u[IU]/mL (ref 0.35–5.50)

## 2020-12-11 LAB — VITAMIN D 25 HYDROXY (VIT D DEFICIENCY, FRACTURES): VITD: 22.32 ng/mL — ABNORMAL LOW (ref 30.00–100.00)

## 2020-12-11 LAB — VITAMIN B12: Vitamin B-12: 129 pg/mL — ABNORMAL LOW (ref 211–911)

## 2020-12-11 NOTE — Progress Notes (Signed)
Christina Guerrero DOB: 04/03/1940 Encounter date: 12/11/2020  This is a 80 y.o. female who presents with Chief Complaint  Patient presents with   Annual Exam    History of present illness:  Seeing Dr. Lolly Mustache on Tuesday; prior visits have been every 6 months. Can't get stuff done in house. Cats and children keep her going. Kept thinking things would get better for her, but they haven't. Has had very hard time since losing husband.   Wants hand looked at - tremor left hand. Noted it months ago. Does feel like it is there all the time. Not in right hand. She is right handed. Does note more with use. No headaches. Not exercising regularly.   Restless legs: used to take the 0.5mg  lower dose for when she wanted to take a nap. Not napping now; takes the 2mg  at bedtime.   Oxybutynin is doing well for her - just taking 1 daily.  Eating sporadically. Buys salads, some fast food. Not cooking like she used to.   GERD controlled with omeprazole. Sx if she misses a dose.    Allergies  Allergen Reactions   Other     propolene glycol   Current Meds  Medication Sig   COVID-19 mRNA vaccine, Pfizer, 30 MCG/0.3ML injection INJECT AS DIRECTED   FLUoxetine (PROZAC) 10 MG capsule Take three capsule daily   NAPROXEN PO Take by mouth.   omeprazole (PRILOSEC) 20 MG capsule TAKE 1 CAPSULE(20 MG) BY MOUTH DAILY   oxybutynin (DITROPAN) 5 MG tablet TAKE 1 TABLET(5 MG) BY MOUTH EVERY 8 HOURS AS NEEDED FOR BLADDER SPASMS   rOPINIRole (REQUIP) 2 MG tablet Take 1 tablet (2 mg total) by mouth at bedtime.   rosuvastatin (CRESTOR) 20 MG tablet TAKE 1 TABLET BY MOUTH DAILY   [DISCONTINUED] rOPINIRole (REQUIP) 0.5 MG tablet TAKE 1 TABLET(0.5 MG) BY MOUTH AT BEDTIME    Review of Systems  Constitutional:  Negative for activity change, appetite change, chills, fatigue, fever and unexpected weight change.  HENT:  Negative for congestion, ear pain, hearing loss, sinus pressure, sinus pain, sore throat and trouble  swallowing.   Eyes:  Negative for pain and visual disturbance.  Respiratory:  Negative for cough, chest tightness, shortness of breath and wheezing.   Cardiovascular:  Negative for chest pain, palpitations and leg swelling.  Gastrointestinal:  Negative for abdominal pain, blood in stool, constipation, diarrhea, nausea and vomiting.  Genitourinary:  Negative for difficulty urinating and menstrual problem.  Musculoskeletal:  Negative for arthralgias and back pain.  Skin:  Negative for rash.  Neurological:  Negative for dizziness, weakness, numbness and headaches.  Hematological:  Negative for adenopathy. Does not bruise/bleed easily.  Psychiatric/Behavioral:  Negative for sleep disturbance and suicidal ideas. The patient is not nervous/anxious.    Objective:  BP 122/78 (BP Location: Left Arm, Patient Position: Sitting, Cuff Size: Normal)   Pulse 67   Temp 98.5 F (36.9 C) (Oral)   Ht 5' 5.5" (1.664 m)   Wt 149 lb 3.2 oz (67.7 kg)   SpO2 95%   BMI 24.45 kg/m   Weight: 149 lb 3.2 oz (67.7 kg)   BP Readings from Last 3 Encounters:  12/11/20 122/78  10/25/20 135/70  07/07/20 132/68   Wt Readings from Last 3 Encounters:  12/11/20 149 lb 3.2 oz (67.7 kg)  10/25/20 150 lb (68 kg)  07/07/20 152 lb 9.6 oz (69.2 kg)    Physical Exam Constitutional:      General: She is not in acute distress.  Appearance: She is well-developed.  HENT:     Head: Normocephalic and atraumatic.     Right Ear: External ear normal.     Left Ear: External ear normal.     Mouth/Throat:     Pharynx: No oropharyngeal exudate.  Eyes:     Conjunctiva/sclera: Conjunctivae normal.     Pupils: Pupils are equal, round, and reactive to light.  Neck:     Thyroid: No thyromegaly.  Cardiovascular:     Rate and Rhythm: Normal rate and regular rhythm.     Heart sounds: Normal heart sounds. No murmur heard.   No friction rub. No gallop.  Pulmonary:     Effort: Pulmonary effort is normal.     Breath sounds:  Normal breath sounds.  Abdominal:     General: Bowel sounds are normal. There is no distension.     Palpations: Abdomen is soft. There is no mass.     Tenderness: There is no abdominal tenderness. There is no guarding.     Hernia: No hernia is present.  Musculoskeletal:        General: No tenderness or deformity. Normal range of motion.     Cervical back: Normal range of motion and neck supple.  Lymphadenopathy:     Cervical: No cervical adenopathy.  Skin:    General: Skin is warm and dry.     Findings: No rash.  Neurological:     Mental Status: She is alert and oriented to person, place, and time.     Motor: Tremor (mild tremor left hand; subtle tremor right hand; both with some increase with activity) present. No weakness, atrophy, abnormal muscle tone or pronator drift.     Coordination: Romberg sign negative. Coordination normal.     Gait: Tandem walk abnormal (states that she has always had trouble with this).     Deep Tendon Reflexes: Reflexes normal.     Reflex Scores:      Tricep reflexes are 2+ on the right side and 2+ on the left side.      Bicep reflexes are 2+ on the right side and 2+ on the left side.      Brachioradialis reflexes are 2+ on the right side and 2+ on the left side.      Patellar reflexes are 2+ on the right side and 2+ on the left side. Psychiatric:        Speech: Speech normal.        Behavior: Behavior normal.        Thought Content: Thought content normal.   PHQ9 SCORE ONLY 12/11/2020 10/25/2020 10/25/2020  PHQ-9 Total Score 16 0 0    Assessment/Plan  1. Episode of recurrent major depressive disorder, unspecified depression episode severity (HCC) She is currently on 30 mg of Prozac daily.  She does follow regularly with psychiatry.  Note sent over to psychiatry to let him know that she is not doing as well as she would like.  She is interested in some medication change, but since appointment is within this week, I will leave any changes up to her  psychiatrist.  2. Gastroesophageal reflux disease, unspecified whether esophagitis present Continue with omeprazole 20 mg daily.  She has been unable to wean off of medication due to symptom return with this.  3. Restless leg Requip does help. Controls sx.  4. Tremor I feel that this is benign, but her husband was diagnosed with benign tremor for years before figuring out that it was parkinsons, which is  contributing to her worry level. I think reasonable to see neurology and discuss in more detail/follow with them.  - CBC with Differential/Platelet; Future - Comprehensive metabolic panel; Future - TSH; Future - Vitamin B12; Future - Ambulatory referral to Neurology - Vitamin B12 - TSH - Comprehensive metabolic panel - CBC with Differential/Platelet  5. Other fatigue Are going to check some additional blood work today.  Just making sure there is nothing else keeping her from achieving her normal activity level.  6. Vitamin D deficiency - VITAMIN D 25 Hydroxy (Vit-D Deficiency, Fractures); Future - VITAMIN D 25 Hydroxy (Vit-D Deficiency, Fractures)  7. B12 deficiency - Vitamin B12; Future - Folate; Future - Folate - Vitamin B12  8. Hyperglycemia Has been diet controlled.  We will continue to encourage healthy eating and regular exercise.  This is been a challenge for her with her ongoing depression.  9. Hyperlipidemia, unspecified hyperlipidemia type On Crestor 20 mg daily. - Lipid panel; Future - Lipid panel  10. Need for immunization against influenza - Flu Vaccine QUAD High Dose(Fluad)  Return in about 3 months (around 03/13/2021) for Chronic condition visit.     Theodis Shove, MD 42 minutes spent in chart review, exam, charting.

## 2020-12-13 ENCOUNTER — Other Ambulatory Visit: Payer: Self-pay

## 2020-12-13 ENCOUNTER — Telehealth (HOSPITAL_BASED_OUTPATIENT_CLINIC_OR_DEPARTMENT_OTHER): Payer: Medicare Other | Admitting: Psychiatry

## 2020-12-13 ENCOUNTER — Encounter (HOSPITAL_COMMUNITY): Payer: Self-pay | Admitting: Psychiatry

## 2020-12-13 VITALS — Wt 149.0 lb

## 2020-12-13 DIAGNOSIS — F331 Major depressive disorder, recurrent, moderate: Secondary | ICD-10-CM | POA: Diagnosis not present

## 2020-12-13 DIAGNOSIS — F4321 Adjustment disorder with depressed mood: Secondary | ICD-10-CM

## 2020-12-13 MED ORDER — ESCITALOPRAM OXALATE 10 MG PO TABS
ORAL_TABLET | ORAL | 0 refills | Status: DC
Start: 2020-12-13 — End: 2021-01-04

## 2020-12-13 NOTE — Progress Notes (Signed)
Virtual Visit via Telephone Note  I connected with Christina Guerrero on 12/13/20 at 10:00 AM EDT by telephone and verified that I am speaking with the correct person using two identifiers.  Location: Patient: Home Provider: Home Office   I discussed the limitations, risks, security and privacy concerns of performing an evaluation and management service by telephone and the availability of in person appointments. I also discussed with the patient that there may be a patient responsible charge related to this service. The patient expressed understanding and agreed to proceed.   History of Present Illness: Patient is evaluated by phone session.  She admitted lately feeling more sad, depressed with lack of motivation to do things.  She endorsed does not care about herself and she used to go for a walk which she has not done in a while.  She only go outside when necessary.  She is not watching television which she used to enjoy.  She had a trip to [place] to visit her daughter and now she is seriously thinking to move [place] close to her daughter.  Patient has a son who lives here but she is more close to her daughter.  She denies any suicidal thoughts, feeling of hopelessness or worthlessness but wants to get better and to have her energy improved.  She is sleeping too much.  She admitted lately not eating properly because she has poor appetite.  She noticed since her husband died it is going to be 1 year this coming Monday slowly and gradually she has been more isolated and withdrawn.  We have recommended grief counseling but patient did not consider that now she may consider if that helps.  She also noticed increased tremors and now her PCP recommended to see neurologist.  She had blood work including B12, vitamin D folic acid, CBC TSH and CMP.  Her vitamin B12 and vitamin D and folate were low.  She denies any paranoia, hallucination but admitted sometime ruminative and negative thoughts.  She is taking  Prozac 30 mg daily but sometimes she feels it did not help as much.  She denies any crying spells.  She denies any mood swing, anger, highs and lows.  Past Psychiatric History:  H/O depression started in 1989.  Saw Dr. Wendall Mola. Took klonopin caused thinning of hair and Wellbutrin did not help.  Took Effexor for a long time but stopped on her own.  No h/o suicidal attempt, inpatient treatment, paranoia, hallucination, aggressive behavior, mania or psychosis.     Recent Results (from the past 2160 hour(s))  Folate     Status: Abnormal   Collection Time: 12/11/20 10:51 AM  Result Value Ref Range   Folate 4.0 (L) >5.9 ng/mL  VITAMIN D 25 Hydroxy (Vit-D Deficiency, Fractures)     Status: Abnormal   Collection Time: 12/11/20 10:51 AM  Result Value Ref Range   VITD 22.32 (L) 30.00 - 100.00 ng/mL  Vitamin B12     Status: Abnormal   Collection Time: 12/11/20 10:51 AM  Result Value Ref Range   Vitamin B-12 129 (L) 211 - 911 pg/mL  TSH     Status: None   Collection Time: 12/11/20 10:51 AM  Result Value Ref Range   TSH 3.10 0.35 - 5.50 uIU/mL  Lipid panel     Status: None   Collection Time: 12/11/20 10:51 AM  Result Value Ref Range   Cholesterol 177 0 - 200 mg/dL    Comment: ATP III Classification  Desirable:  < 200 mg/dL               Borderline High:  200 - 239 mg/dL          High:  > = 875 mg/dL   Triglycerides 643.3 0.0 - 149.0 mg/dL    Comment: Normal:  <295 mg/dLBorderline High:  150 - 199 mg/dL   HDL 18.84 >16.60 mg/dL   VLDL 63.0 0.0 - 16.0 mg/dL   LDL Cholesterol 95 0 - 99 mg/dL   Total CHOL/HDL Ratio 3     Comment:                Men          Women1/2 Average Risk     3.4          3.3Average Risk          5.0          4.42X Average Risk          9.6          7.13X Average Risk          15.0          11.0                       NonHDL 116.54     Comment: NOTE:  Non-HDL goal should be 30 mg/dL higher than patient's LDL goal (i.e. LDL goal of < 70 mg/dL, would have non-HDL  goal of < 100 mg/dL)  Comprehensive metabolic panel     Status: Abnormal   Collection Time: 12/11/20 10:51 AM  Result Value Ref Range   Sodium 141 135 - 145 mEq/L   Potassium 3.8 3.5 - 5.1 mEq/L   Chloride 103 96 - 112 mEq/L   CO2 29 19 - 32 mEq/L   Glucose, Bld 88 70 - 99 mg/dL   BUN 12 6 - 23 mg/dL   Creatinine, Ser 1.09 0.40 - 1.20 mg/dL   Total Bilirubin 0.7 0.2 - 1.2 mg/dL   Alkaline Phosphatase 83 39 - 117 U/L   AST 20 0 - 37 U/L   ALT 12 0 - 35 U/L   Total Protein 7.1 6.0 - 8.3 g/dL   Albumin 4.6 3.5 - 5.2 g/dL   GFR 32.35 (L) >57.32 mL/min    Comment: Calculated using the CKD-EPI Creatinine Equation (2021)   Calcium 9.5 8.4 - 10.5 mg/dL  CBC with Differential/Platelet     Status: None   Collection Time: 12/11/20 10:51 AM  Result Value Ref Range   WBC 5.7 4.0 - 10.5 K/uL   RBC 4.52 3.87 - 5.11 Mil/uL   Hemoglobin 14.6 12.0 - 15.0 g/dL   HCT 20.2 54.2 - 70.6 %   MCV 97.0 78.0 - 100.0 fl   MCHC 33.2 30.0 - 36.0 g/dL   RDW 23.7 62.8 - 31.5 %   Platelets 208.0 150.0 - 400.0 K/uL   Neutrophils Relative % 70.3 43.0 - 77.0 %   Lymphocytes Relative 22.8 12.0 - 46.0 %   Monocytes Relative 4.3 3.0 - 12.0 %   Eosinophils Relative 1.7 0.0 - 5.0 %   Basophils Relative 0.9 0.0 - 3.0 %   Neutro Abs 4.0 1.4 - 7.7 K/uL   Lymphs Abs 1.3 0.7 - 4.0 K/uL   Monocytes Absolute 0.2 0.1 - 1.0 K/uL   Eosinophils Absolute 0.1 0.0 - 0.7 K/uL   Basophils Absolute 0.1 0.0 - 0.1 K/uL  Psychiatric Specialty Exam: Physical Exam  Review of Systems  Weight 149 lb (67.6 kg).There is no height or weight on file to calculate BMI.  General Appearance: NA  Eye Contact:  NA  Speech:  Slow  Volume:  Decreased  Mood:  Anxious, Depressed, and Dysphoric  Affect:  NA  Thought Process:  Goal Directed  Orientation:  Full (Time, Place, and Person)  Thought Content:  Rumination  Suicidal Thoughts:  No  Homicidal Thoughts:  No  Memory:  Immediate;   Good Recent;   Good Remote;   Good  Judgement:   Intact  Insight:  Present  Psychomotor Activity:  Tremor  Concentration:  Concentration: Fair and Attention Span: Good  Recall:  Good  Fund of Knowledge:  Good  Language:  Good  Akathisia:  NA  Handed:  Right  AIMS (if indicated):     Assets:  Communication Skills Desire for Improvement Housing Transportation  ADL's:  Intact  Cognition:  WNL  Sleep:   too much      Assessment and Plan: Major depressive disorder, recurrent.  Grief.  I talked to her in detail about trying a different medication.  I also reviewed blood work results.  She has low vitamin B12, vitamin D and folate.  I suggested she should call the PCP and get recommendations from them.  I also suggested that she should consider grief counseling as she still misses her husband and sometimes she feel lonely.  She is seriously considering to move to [place] to live closer to her daughter.  She has no issue with her son or daughter-in-law but wants to live close to the daughter.  Her PCP also referred her to see neurologist to underlying any movement disorder.  I recommend to cut down the Prozac from 30 mg to take only 10 mg for 1 week and discontinue.  In the past she had tried Effexor and Wellbutrin.  Recommend to try Lexapro which she has never tried before.  We will start 5 mg Lexapro for 1 week and then 10 mg daily.  I recommended to call us back if there is any question, concern or if she feels worsening of the symptoms or having any time active suicidal thoughts.  We will follow up in 3 weeks.  I will also forward my note to her PCP.  We will provide hospice information for grief counseling.   Follow Up Instructions:    I discussed the assessment and treatment plan with the patient. The patient was provided an opportunity to ask questions and all were answered. The patient agreed with the plan and demonstrated an understanding of the instructions.   The patient was advised to call back or seek an in-person evaluation if  the symptoms worsen or if the condition fails to improve as anticipated.  I provided 30 minutes of non-face-to-face time during this encounter.   Cleotis Nipper, MD

## 2020-12-15 NOTE — Addendum Note (Signed)
Addended by: Johnella Moloney on: 12/15/2020 11:25 AM   Modules accepted: Orders

## 2021-01-04 ENCOUNTER — Encounter (HOSPITAL_COMMUNITY): Payer: Self-pay | Admitting: Psychiatry

## 2021-01-04 ENCOUNTER — Telehealth (HOSPITAL_BASED_OUTPATIENT_CLINIC_OR_DEPARTMENT_OTHER): Payer: Medicare Other | Admitting: Psychiatry

## 2021-01-04 ENCOUNTER — Other Ambulatory Visit: Payer: Self-pay

## 2021-01-04 VITALS — Wt 149.0 lb

## 2021-01-04 DIAGNOSIS — F419 Anxiety disorder, unspecified: Secondary | ICD-10-CM

## 2021-01-04 DIAGNOSIS — F331 Major depressive disorder, recurrent, moderate: Secondary | ICD-10-CM

## 2021-01-04 MED ORDER — ESCITALOPRAM OXALATE 20 MG PO TABS: 20.0000 mg | ORAL_TABLET | Freq: Every day | ORAL | 1 refills | Status: DC

## 2021-01-04 NOTE — Progress Notes (Signed)
Virtual Visit via Telephone Note  I connected with Ngun Scheideman on 01/04/21 at  9:40 AM EST by telephone and verified that I am speaking with the correct person using two identifiers.  Location: Patient: Home Provider: Home Office   I discussed the limitations, risks, security and privacy concerns of performing an evaluation and management service by telephone and the availability of in person appointments. I also discussed with the patient that there may be a patient responsible charge related to this service. The patient expressed understanding and agreed to proceed.   History of Present Illness: Patient is evaluated by phone session.  She is now taking Lexapro which was started on the last visit.  She was having a lot of dysphoria, lack of motivation to do things.  She is no longer taking Prozac.  Patient reported not a big change with Lexapro but admitted that she was able to go outside for shopping even though she did not buy anything.  She also reported her daughter notices improvement to in her mood.  She is sleeping still too much but denies any hopelessness, crying spells or any feeling of hopelessness.  During the conversation she has some laughter and she acknowledged that.  She has a plan to spend Thanksgiving with her son who lives in [place].  She has not made a plan to move to [place] as she is not sure and needs some more time.  Her appetite is okay.  She reported her energy level is good.  She denies any suicidal thoughts.  She has chronic tremors but does not notice new medicine has caused worsening of tremors.  She is in the process of seeing a neurologist.  She is taking vitamins which were low.  She has not started group therapy but in touch with her daughter regularly.    Past Psychiatric History:  H/O depression started in 1989.  Saw Dr. Viviano Simas. Took klonopin caused thinning of hair and Wellbutrin did not help.  Took Effexor for a long time but stopped on her  own.  No h/o suicidal attempt, inpatient treatment, paranoia, hallucination, aggressive behavior, mania or psychosis.     Psychiatric Specialty Exam: Physical Exam  Review of Systems  Weight 149 lb (67.6 kg).There is no height or weight on file to calculate BMI.  General Appearance: NA  Eye Contact:  NA  Speech:  Clear and Coherent and Normal Rate  Volume:  Normal  Mood:  Dysphoric  Affect:  NA  Thought Process:  Goal Directed  Orientation:  Full (Time, Place, and Person)  Thought Content:  Logical  Suicidal Thoughts:  No  Homicidal Thoughts:  No  Memory:  Immediate;   Good Recent;   Good Remote;   Good  Judgement:  Good  Insight:  Present  Psychomotor Activity:  NA  Concentration:  Concentration: Good and Attention Span: Good  Recall:  Good  Fund of Knowledge:  Good  Language:  Good  Akathisia:  No  Handed:  Right  AIMS (if indicated):     Assets:  Communication Skills Desire for Improvement Housing Resilience Social Support  ADL's:  Intact  Cognition:  WNL  Sleep:         Assessment and Plan: Major depressive disorder, recurrent.  Anxiety.    I review symptoms.  Patient is actually somewhat better than before.  I encourage to try higher dose of Lexapro to see if she can get much better response of the medication.  So far she has no  side effects.  She agreed to give a try.  Again I recommended to consider grief counseling.  We will try Lexapro 20 mg daily.  I recommend to call us back if she is any question or any concern.  Recommend consider grief counseling.  Follow up in 2 months.  Anxiety  Follow Up Instructions:    I discussed the assessment and treatment plan with the patient. The patient was provided an opportunity to ask questions and all were answered. The patient agreed with the plan and demonstrated an understanding of the instructions.   The patient was advised to call back or seek an in-person evaluation if the symptoms worsen or if the condition  fails to improve as anticipated.  I provided 20 minutes of non-face-to-face time during this encounter.   Cleotis Nipper, MD

## 2021-01-23 ENCOUNTER — Other Ambulatory Visit: Payer: Medicare Other

## 2021-01-24 ENCOUNTER — Other Ambulatory Visit (HOSPITAL_COMMUNITY): Payer: Self-pay | Admitting: Psychiatry

## 2021-01-24 DIAGNOSIS — F4321 Adjustment disorder with depressed mood: Secondary | ICD-10-CM

## 2021-01-24 DIAGNOSIS — F331 Major depressive disorder, recurrent, moderate: Secondary | ICD-10-CM

## 2021-02-23 ENCOUNTER — Other Ambulatory Visit: Payer: Self-pay | Admitting: Family Medicine

## 2021-02-23 DIAGNOSIS — K219 Gastro-esophageal reflux disease without esophagitis: Secondary | ICD-10-CM

## 2021-03-02 ENCOUNTER — Other Ambulatory Visit (HOSPITAL_COMMUNITY): Payer: Self-pay | Admitting: Psychiatry

## 2021-03-02 DIAGNOSIS — F331 Major depressive disorder, recurrent, moderate: Secondary | ICD-10-CM

## 2021-03-05 ENCOUNTER — Telehealth (HOSPITAL_BASED_OUTPATIENT_CLINIC_OR_DEPARTMENT_OTHER): Payer: Medicare Other | Admitting: Psychiatry

## 2021-03-05 ENCOUNTER — Other Ambulatory Visit: Payer: Self-pay

## 2021-03-05 ENCOUNTER — Encounter (HOSPITAL_COMMUNITY): Payer: Self-pay | Admitting: Psychiatry

## 2021-03-05 DIAGNOSIS — F331 Major depressive disorder, recurrent, moderate: Secondary | ICD-10-CM

## 2021-03-05 MED ORDER — ESCITALOPRAM OXALATE 20 MG PO TABS: 20.0000 mg | ORAL_TABLET | Freq: Every day | ORAL | 0 refills | Status: DC

## 2021-03-05 NOTE — Progress Notes (Signed)
Virtual Visit via Telephone Note  I connected with Christina Guerrero on 03/05/21 at 10:40 AM EST by telephone and verified that I am speaking with the correct person using two identifiers.  Location: Patient: Home Provider: Home Office   I discussed the limitations, risks, security and privacy concerns of performing an evaluation and management service by telephone and the availability of in person appointments. I also discussed with the patient that there may be a patient responsible charge related to this service. The patient expressed understanding and agreed to proceed.   History of Present Illness: Patient is evaluated by phone session.  She is taking Lexapro 20 mg which was increased on the last visit.  She is doing better.  She had a trip to [place] and she really enjoyed this time with the daughter but she did not like the very cold weather.  She is sleeping good.  She has no concern from Lexapro.  She has no tremor or shakes or any EPS.  She is more active and spending time in the backyard.  Her appetite is okay.  Her energy level is good.  She denies any crying spells or any feeling of hopelessness or worthlessness.  She tried to keep herself busy as she has good friends network.  She also in touch with her son and daughter on a regular basis.  She denies any panic attack.  She does not feel she needs therapy as things are going well.   Past Psychiatric History:  H/O depression started in 1989.  Saw Dr. Viviano Simas. Took klonopin caused thinning of hair and Wellbutrin did not help.  Took Effexor for a long time but stopped on her own.  No h/o suicidal attempt, inpatient treatment, paranoia, hallucination, aggressive behavior, mania or psychosis.     Psychiatric Specialty Exam: Physical Exam  Review of Systems  Weight 149 lb (67.6 kg).There is no height or weight on file to calculate BMI.  General Appearance: NA  Eye Contact:  NA  Speech:  Normal Rate  Volume:  Normal  Mood:   Euthymic  Affect:  NA  Thought Process:  Goal Directed  Orientation:  Full (Time, Place, and Person)  Thought Content:  Logical  Suicidal Thoughts:  No  Homicidal Thoughts:  No  Memory:  Immediate;   Good Recent;   Good Remote;   Good  Judgement:  Intact  Insight:  Present  Psychomotor Activity:  NA  Concentration:  Concentration: Good and Attention Span: Good  Recall:  Good  Fund of Knowledge:  Good  Language:  Good  Akathisia:  No  Handed:  Right  AIMS (if indicated):     Assets:  Communication Skills Desire for Improvement Housing Resilience  ADL's:  Intact  Cognition:  WNL  Sleep:   ok      Assessment and Plan: Major depressive disorder, recurrent.  Anxiety.  Patient doing better on Lexapro 20 mg which was increased on the last visit.  She does not feel she needed any grief counseling as things are much better.  Discussed medication side effects and benefits.  Continue Lexapro 20 mg daily.  Recommended to call us back if she is any question or any concern.  We will follow up in 3 months.  Follow Up Instructions:    I discussed the assessment and treatment plan with the patient. The patient was provided an opportunity to ask questions and all were answered. The patient agreed with the plan and demonstrated an understanding of the instructions.  The patient was advised to call back or seek an in-person evaluation if the symptoms worsen or if the condition fails to improve as anticipated.  I provided 22 minutes of non-face-to-face time during this encounter.   Kathlee Nations, MD

## 2021-03-14 ENCOUNTER — Encounter: Payer: Self-pay | Admitting: Family Medicine

## 2021-03-14 ENCOUNTER — Ambulatory Visit (INDEPENDENT_AMBULATORY_CARE_PROVIDER_SITE_OTHER): Payer: Medicare Other | Admitting: Family Medicine

## 2021-03-14 VITALS — BP 132/70 | HR 57 | Temp 98.8°F | Wt 155.0 lb

## 2021-03-14 DIAGNOSIS — E538 Deficiency of other specified B group vitamins: Secondary | ICD-10-CM

## 2021-03-14 DIAGNOSIS — F339 Major depressive disorder, recurrent, unspecified: Secondary | ICD-10-CM

## 2021-03-14 DIAGNOSIS — R32 Unspecified urinary incontinence: Secondary | ICD-10-CM | POA: Diagnosis not present

## 2021-03-14 DIAGNOSIS — K219 Gastro-esophageal reflux disease without esophagitis: Secondary | ICD-10-CM

## 2021-03-14 DIAGNOSIS — G2581 Restless legs syndrome: Secondary | ICD-10-CM

## 2021-03-14 MED ORDER — FOLIC ACID 1 MG PO TABS: 1.0000 mg | ORAL_TABLET | Freq: Every day | ORAL | 0 refills | Status: AC

## 2021-03-14 NOTE — Progress Notes (Signed)
Christina Guerrero DOB: 02-11-1941 Encounter date: 03/14/2021  This is a 81 y.o. female who presents with Chief Complaint  Patient presents with   Follow-up    58mo f/u for chronic conditions    History of present illness:  Last visit with me was 12/11/2020.  That was her annual exam.   At her last visit, we discussed depression.  She has been following with psychiatry.  She has had a difficult time since her husband's passing.  Last visit was 03/05/2021.  Currently on Lexapro 20 mg. She feels like mood is slightly better.   Urinary incontinence: Oxybutynin  GERD: prilosec 20mg .  Restless legs: controlled with requip. Has something new - like muscle spasm, but doesn't hurt. In right leg, in thigh, but sometimes in calf. Feels it at night. Does have some back pain; but doesn't feel associated with this. Usually takes a minute to get moving/get back worked out. Gets cramp multiple times through night, but not every night. Not associated with cause.   Has started walking regularly - at least half mile/day.   She has been taking the b12 regularly.   Allergies  Allergen Reactions   Other     propolene glycol   Current Meds  Medication Sig   escitalopram (LEXAPRO) 20 MG tablet Take 1 tablet (20 mg total) by mouth daily.   folic acid (FOLVITE) 1 MG tablet Take 1 tablet (1 mg total) by mouth daily.   NAPROXEN PO Take by mouth.   omeprazole (PRILOSEC) 20 MG capsule TAKE 1 CAPSULE(20 MG) BY MOUTH DAILY   oxybutynin (DITROPAN) 5 MG tablet TAKE 1 TABLET(5 MG) BY MOUTH EVERY 8 HOURS AS NEEDED FOR BLADDER SPASMS   rOPINIRole (REQUIP) 2 MG tablet Take 1 tablet (2 mg total) by mouth at bedtime.   rosuvastatin (CRESTOR) 20 MG tablet TAKE 1 TABLET BY MOUTH DAILY    Review of Systems  Constitutional:  Positive for fatigue (just hard to find motivation still). Negative for chills and fever.  Respiratory:  Negative for cough, chest tightness, shortness of breath and wheezing.   Cardiovascular:   Negative for chest pain, palpitations and leg swelling.  Musculoskeletal:        Muscle cramps  Neurological:  Tremors: left hand tremor improved. she was referred to neuro last visit, but didn't know she missed call.  Psychiatric/Behavioral:  Sleep disturbance: still sleeping too much.    Objective:  BP 132/70 (BP Location: Left Arm, Patient Position: Sitting, Cuff Size: Normal)    Pulse (!) 57    Temp 98.8 F (37.1 C) (Oral)    Wt 155 lb (70.3 kg)    SpO2 95%    BMI 25.40 kg/m   Weight: 155 lb (70.3 kg)   BP Readings from Last 3 Encounters:  03/14/21 132/70  12/11/20 122/78  10/25/20 135/70   Wt Readings from Last 3 Encounters:  03/14/21 155 lb (70.3 kg)  03/05/21 149 lb (67.6 kg)  01/04/21 149 lb (67.6 kg)    Physical Exam Constitutional:      General: She is not in acute distress.    Appearance: She is well-developed.  Cardiovascular:     Rate and Rhythm: Normal rate and regular rhythm.     Heart sounds: Normal heart sounds. No murmur heard.   No friction rub.  Pulmonary:     Effort: Pulmonary effort is normal. No respiratory distress.     Breath sounds: Normal breath sounds. No wheezing or rales.  Musculoskeletal:     Right  lower leg: No edema.     Left lower leg: No edema.  Neurological:     Mental Status: She is alert and oriented to person, place, and time.  Psychiatric:        Behavior: Behavior normal.    Assessment/Plan 1. Episode of recurrent major depressive disorder, unspecified depression episode severity (HCC) She is following with psychiatry. We discussed setting alarm to decrease amount of sleep nightly (12 hours too much). Discussed finding something to motivate getting her out of bed to help with process. Currently on lexapro.  2. Restless leg Well controlled with requip 2mg  qhs.   3. Gastroesophageal reflux disease, unspecified whether esophagitis present Controlled with omeprazole.  4. B12 deficiency She has been taking b12, butnot folic  acid. Discussed importance of both.  - Vitamin B12; Future  5. Folate deficiency - Folate; Future - folic acid (FOLVITE) 1 MG tablet; Take 1 tablet (1 mg total) by mouth daily.  Dispense: 90 tablet; Refill: 0  6. Urge incontinence: Oxybutynin helps, but is wearing off.encouraged second after noon dose. May need evening dose as well. Consider XR if needing 3 doses daily   Return in about 1 month (around 04/14/2021) for blood work in 1 months time.     04/16/2021, MD

## 2021-03-14 NOTE — Patient Instructions (Addendum)
Ok to take the oxybutynin in the morning and then again in afternoon. See if this helps with afternoon urgency. If you feel that you need a third dose at bedtime to help with evening frequency/urgency, then let me know and we can try the extended release oxybutynin.   Make sure you are taking vitamin D - I suggest 1000-2000 units daily.

## 2021-03-18 ENCOUNTER — Other Ambulatory Visit: Payer: Self-pay | Admitting: Family Medicine

## 2021-03-18 DIAGNOSIS — G2581 Restless legs syndrome: Secondary | ICD-10-CM

## 2021-04-16 ENCOUNTER — Other Ambulatory Visit (INDEPENDENT_AMBULATORY_CARE_PROVIDER_SITE_OTHER): Payer: Medicare Other

## 2021-04-16 DIAGNOSIS — E538 Deficiency of other specified B group vitamins: Secondary | ICD-10-CM | POA: Diagnosis not present

## 2021-04-16 LAB — FOLATE: Folate: 22.4 ng/mL (ref >5.9)

## 2021-04-16 LAB — VITAMIN B12: Vitamin B-12: 844 pg/mL (ref 211–911)

## 2021-05-02 IMAGING — DX DG HIP (WITH OR WITHOUT PELVIS) 2-3V*L*
3 series · 3 of 3 positions shown · non-contrast
Comparison: 07/07/2013

CLINICAL DATA: Chronic left hip pain.

EXAM:
DG HIP (WITH OR WITHOUT PELVIS) 2-3V LEFT

[pelvis ap]
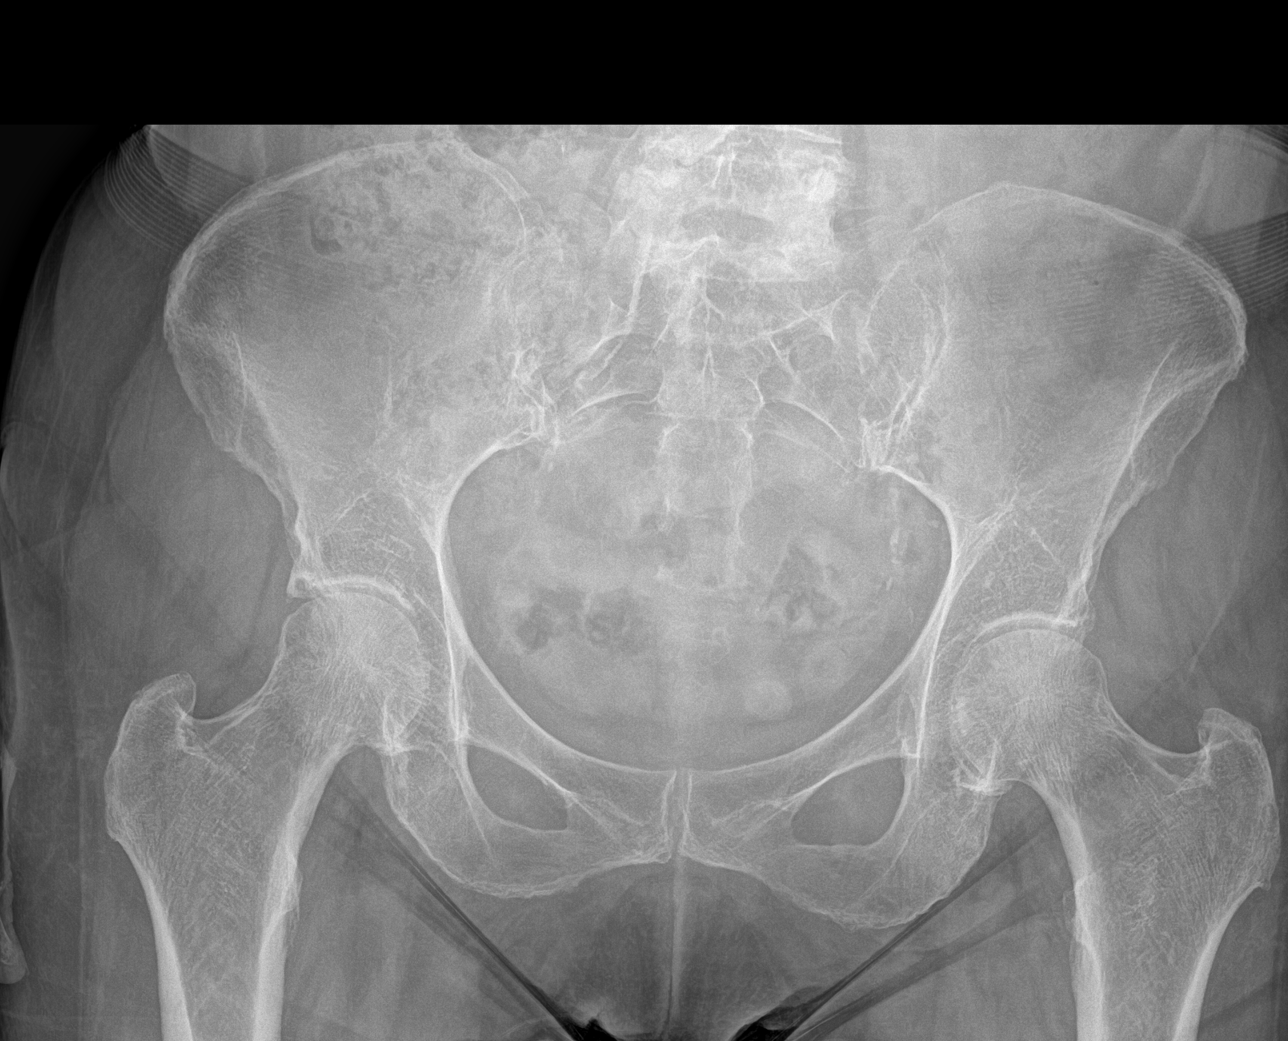

[hip ap]
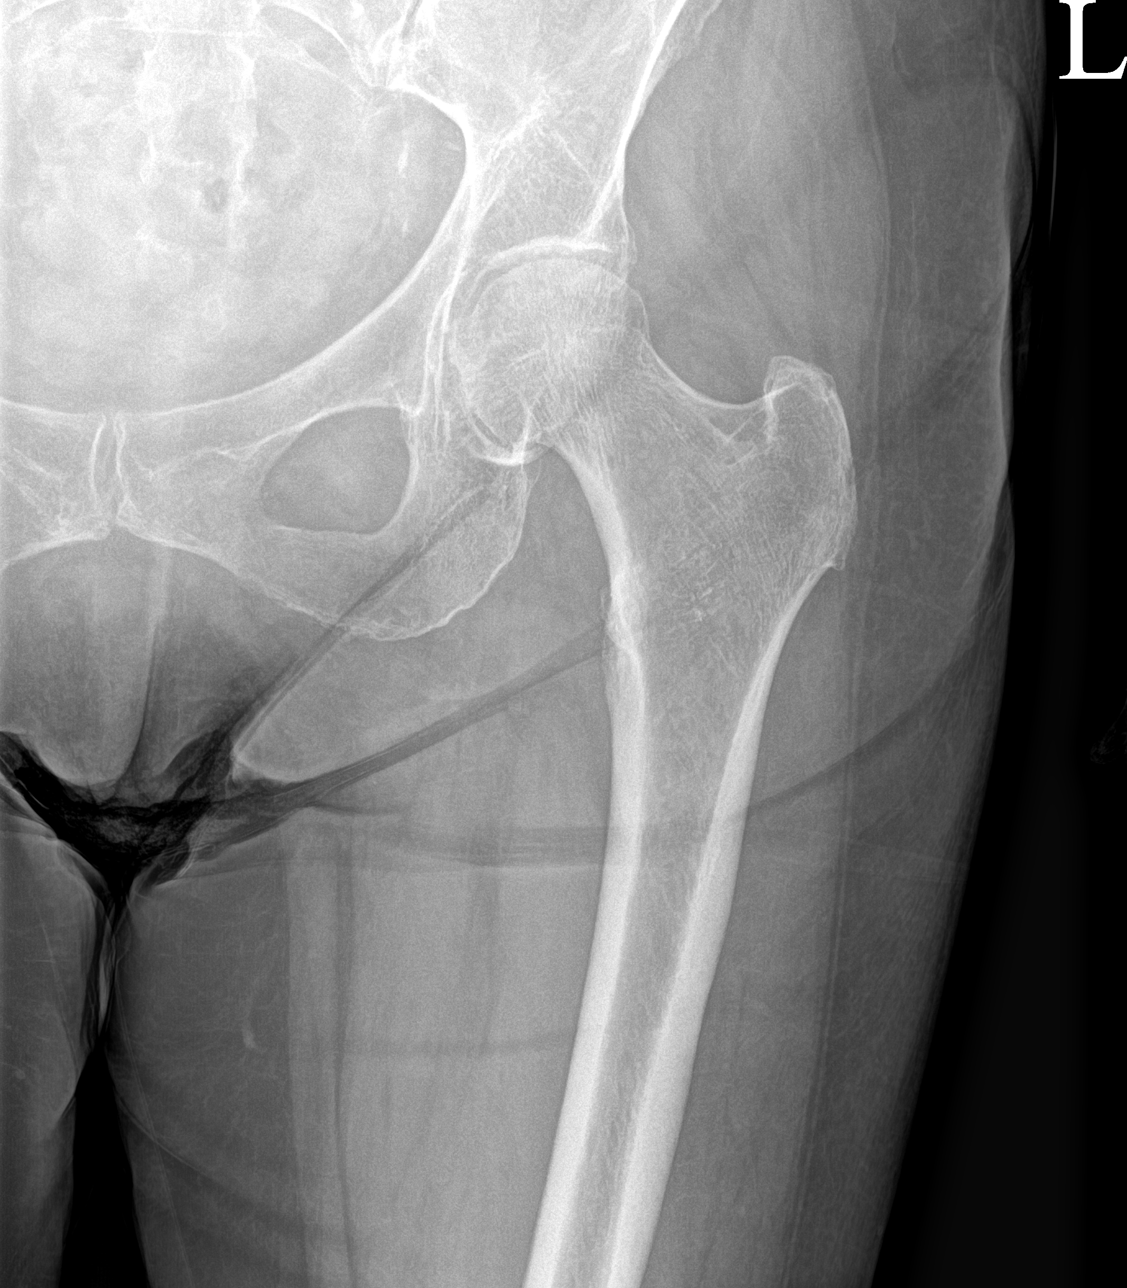

[hip frog leg]
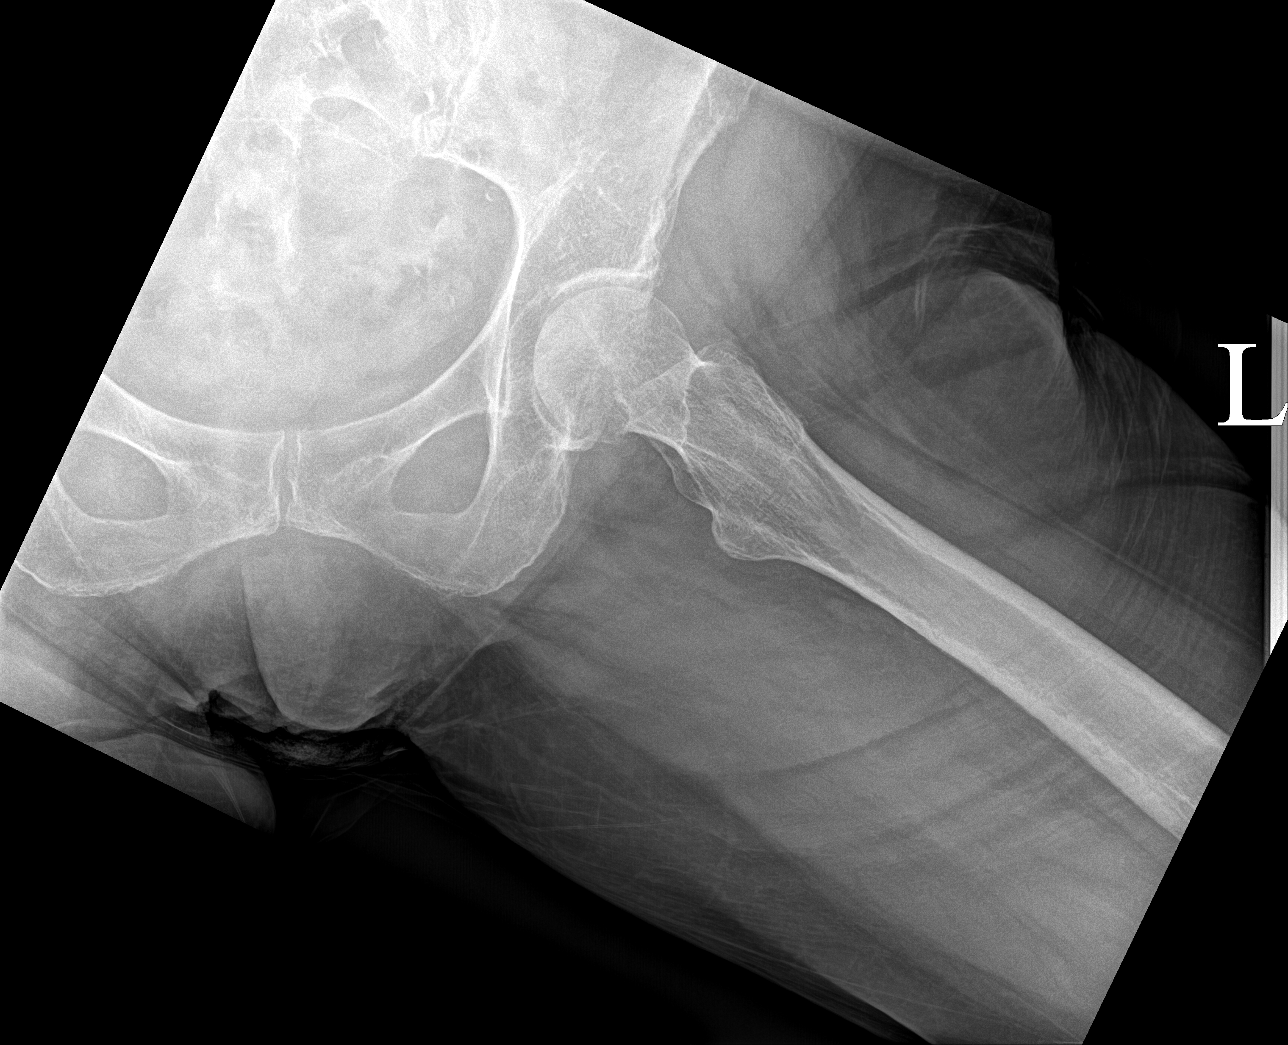

[3 of 3 positions shown; findings below may reference images not displayed]

FINDINGS: Mild to moderate bilateral hip joint degenerative changes but no
acute bony findings or plain film evidence of avascular necrosis.
The pubic symphysis and SI joints are intact. No pelvic fractures or
bone lesions.
IMPRESSION: Mild to moderate bilateral hip joint degenerative changes but no
acute bony findings.

## 2021-05-24 ENCOUNTER — Other Ambulatory Visit: Payer: Self-pay | Admitting: Family Medicine

## 2021-06-04 ENCOUNTER — Encounter (HOSPITAL_COMMUNITY): Payer: Self-pay | Admitting: Psychiatry

## 2021-06-04 ENCOUNTER — Telehealth (HOSPITAL_BASED_OUTPATIENT_CLINIC_OR_DEPARTMENT_OTHER): Payer: Medicare Other | Admitting: Psychiatry

## 2021-06-04 VITALS — Wt 150.0 lb

## 2021-06-04 DIAGNOSIS — F419 Anxiety disorder, unspecified: Secondary | ICD-10-CM

## 2021-06-04 DIAGNOSIS — F331 Major depressive disorder, recurrent, moderate: Secondary | ICD-10-CM

## 2021-06-04 MED ORDER — ESCITALOPRAM OXALATE 20 MG PO TABS: 20.0000 mg | ORAL_TABLET | Freq: Every day | ORAL | 0 refills | Status: DC

## 2021-06-04 MED ORDER — ARIPIPRAZOLE 2 MG PO TABS: 2.0000 mg | ORAL_TABLET | Freq: Every day | ORAL | 0 refills | Status: DC

## 2021-06-04 NOTE — Progress Notes (Signed)
Virtual Visit via Telephone Note ? ?I connected with Christina Guerrero on 06/04/21 at  2:40 PM EDT by telephone and verified that I am speaking with the correct person using two identifiers. ? ?Location: ?Patient: Home ?Provider: Home office ?  ?I discussed the limitations, risks, security and privacy concerns of performing an evaluation and management service by telephone and the availability of in person appointments. I also discussed with the patient that there may be a patient responsible charge related to this service. The patient expressed understanding and agreed to proceed. ? ? ?History of Present Illness: ?Patient is evaluated by phone session.  She is taking all her medication as prescribed.  She is taking Lexapro which was increased a few months ago and she noticed improvement in her mood but is still there are days when she has no energy or motivation to do things.  She is sleeping too much.  She denies any crying spells or any feeling of hopelessness but there are days when she has no desire to get up and do things.  She enjoyed the family company and today she is happy because having a dinner with her son.  She has no tremors or shakes or any EPS.  She is taking Requip for her restless leg.  Recently she had blood work and her B12 level improved.  Patient denies any panic attack. ?  ?Past Psychiatric History:  ?H/O depression started in 1989.  Saw Dr. Wendall Mola. Took klonopin caused thinning of hair and Wellbutrin did not help.  Took Effexor for a long time but stopped on her own.  No h/o suicidal attempt, inpatient treatment, paranoia, hallucination, aggressive behavior, mania or psychosis.    ? ?Psychiatric Specialty Exam: ?Physical Exam  ?Review of Systems  ?Weight 150 lb (68 kg).There is no height or weight on file to calculate BMI.  ?General Appearance: NA  ?Eye Contact:  NA  ?Speech:  Normal Rate  ?Volume:  Normal  ?Mood:  Euthymic  ?Affect:  NA  ?Thought Process:  Goal Directed   ?Orientation:  Full (Time, Place, and Person)  ?Thought Content:  WDL  ?Suicidal Thoughts:  No  ?Homicidal Thoughts:  No  ?Memory:  Immediate;   Good ?Recent;   Good ?Remote;   Good  ?Judgement:  Intact  ?Insight:  Good  ?Psychomotor Activity:  NA  ?Concentration:  Concentration: Good and Attention Span: Good  ?Recall:  Good  ?Fund of Knowledge:  Good  ?Language:  Good  ?Akathisia:  No  ?Handed:  Right  ?AIMS (if indicated):     ?Assets:  Communication Skills ?Desire for Improvement ?Housing ?Resilience ?Social Support ?Transportation  ?ADL's:  Intact  ?Cognition:  WNL  ?Sleep:   too much  ? ? ? ? ?Assessment and Plan: ?Major depressive disorder, recurrent.  Anxiety. ? ?I discussed to add low-dose Abilify to help her depressive symptoms.  She agreed with the plan.  We will continue Lexapro 20 mg daily and add Abilify 2 mg to help the residual symptoms.  We discussed medication side effects and benefits.  I recommend to call us back if she has any question concern or worsening of the symptoms.  We will provide Abilify 2 mg for 30 days however if she feels improvement then she can call us to add more refills.  We will provide Lexapro 20 mg 90-day supply.  Follow-up in 3 months. ? ?Follow Up Instructions: ? ?  ?I discussed the assessment and treatment plan with the patient. The patient was  provided an opportunity to ask questions and all were answered. The patient agreed with the plan and demonstrated an understanding of the instructions. ?  ?The patient was advised to call back or seek an in-person evaluation if the symptoms worsen or if the condition fails to improve as anticipated. ? ?Collaboration of Care: Primary Care Provider AEB notes are available in epic to review ? ?Patient/Guardian was advised Release of Information must be obtained prior to any record release in order to collaborate their care with an outside provider. Patient/Guardian was advised if they have not already done so to contact the  registration department to sign all necessary forms in order for [place] to release information regarding their care.  ? ?Consent: Patient/Guardian gives verbal consent for treatment and assignment of benefits for services provided during this visit. Patient/Guardian expressed understanding and agreed to proceed.   ? ?I provided 14 minutes of non-face-to-face time during this encounter. ? ? ?Cleotis Nipper, MD  ?

## 2021-07-03 ENCOUNTER — Other Ambulatory Visit (HOSPITAL_COMMUNITY): Payer: Self-pay | Admitting: Psychiatry

## 2021-07-03 DIAGNOSIS — F331 Major depressive disorder, recurrent, moderate: Secondary | ICD-10-CM

## 2021-07-03 DIAGNOSIS — F419 Anxiety disorder, unspecified: Secondary | ICD-10-CM

## 2021-07-11 ENCOUNTER — Other Ambulatory Visit (HOSPITAL_COMMUNITY): Payer: Self-pay | Admitting: *Deleted

## 2021-07-11 DIAGNOSIS — F419 Anxiety disorder, unspecified: Secondary | ICD-10-CM

## 2021-07-11 DIAGNOSIS — F331 Major depressive disorder, recurrent, moderate: Secondary | ICD-10-CM

## 2021-07-11 MED ORDER — ARIPIPRAZOLE 2 MG PO TABS: 2.0000 mg | ORAL_TABLET | Freq: Every day | ORAL | 1 refills | Status: DC

## 2021-07-25 ENCOUNTER — Other Ambulatory Visit: Payer: Self-pay | Admitting: Family Medicine

## 2021-08-22 ENCOUNTER — Other Ambulatory Visit: Payer: Self-pay | Admitting: *Deleted

## 2021-08-22 DIAGNOSIS — K219 Gastro-esophageal reflux disease without esophagitis: Secondary | ICD-10-CM

## 2021-08-22 MED ORDER — OMEPRAZOLE 20 MG PO CPDR: DELAYED_RELEASE_CAPSULE | ORAL | 1 refills | Status: DC

## 2021-09-03 ENCOUNTER — Encounter (HOSPITAL_COMMUNITY): Payer: Self-pay | Admitting: Psychiatry

## 2021-09-03 ENCOUNTER — Telehealth (HOSPITAL_BASED_OUTPATIENT_CLINIC_OR_DEPARTMENT_OTHER): Payer: Medicare Other | Admitting: Psychiatry

## 2021-09-03 DIAGNOSIS — F419 Anxiety disorder, unspecified: Secondary | ICD-10-CM

## 2021-09-03 DIAGNOSIS — G2581 Restless legs syndrome: Secondary | ICD-10-CM

## 2021-09-03 DIAGNOSIS — F331 Major depressive disorder, recurrent, moderate: Secondary | ICD-10-CM | POA: Diagnosis not present

## 2021-09-03 MED ORDER — ARIPIPRAZOLE 2 MG PO TABS: 2.0000 mg | ORAL_TABLET | Freq: Every day | ORAL | 0 refills | Status: DC

## 2021-09-03 MED ORDER — ESCITALOPRAM OXALATE 20 MG PO TABS: 20.0000 mg | ORAL_TABLET | Freq: Every day | ORAL | 0 refills | Status: DC

## 2021-09-03 MED ORDER — ROPINIROLE HCL 2 MG PO TABS
ORAL_TABLET | ORAL | 0 refills | Status: DC
Start: 2021-09-03 — End: 2021-11-27

## 2021-09-03 NOTE — Progress Notes (Signed)
Virtual Visit via Telephone Note  I connected with Christina Guerrero on 09/03/21 at  2:20 PM EDT by telephone and verified that I am speaking with the correct person using two identifiers.  Location: Patient: Home Provider: Home Office   I discussed the limitations, risks, security and privacy concerns of performing an evaluation and management service by telephone and the availability of in person appointments. I also discussed with the patient that there may be a patient responsible charge related to this service. The patient expressed understanding and agreed to proceed.   History of Present Illness: Patient is evaluated by phone session.  She is taking all her medication.  We started low-dose Abilify and that helped her a lot.  She has more energy and motivation to do things.  She is also very happy because now she had decided to move to [place] closer to her daughter.  She is not sure when it will happen but thinking about her end of August.  She has to put her house on the market very soon.  She still sleeps too much but her mood is better.  She denies any crying spells or any feeling of hopelessness or worthlessness.  She denies any panic attacks.  Her appetite is improved.  She denies any auditory or visual hallucination.  She denies any tremors, shakes or any EPS.  She is sad because her PCP is left and she is not sure who will be new PCP but now she is not focused as much because moving to [place] to find a new doctor there.  She like to have a refill of Requip which helps her restless leg.      Past Psychiatric History:  H/O depression started in 1989.  Saw Dr. Wendall Mola. Took klonopin caused thinning of hair and Wellbutrin did not help.  Took Effexor for a long time but stopped on her own.  No h/o suicidal attempt, inpatient treatment, paranoia, hallucination, aggressive behavior, mania or psychosis.     Psychiatric Specialty Exam: Physical Exam  Review of Systems  There were no  vitals taken for this visit.There is no height or weight on file to calculate BMI.  General Appearance: NA  Eye Contact:  NA  Speech:  Clear and Coherent and Normal Rate  Volume:  Normal  Mood:  Euthymic  Affect:  NA  Thought Process:  Goal Directed  Orientation:  Full (Time, Place, and Person)  Thought Content:  WDL  Suicidal Thoughts:  No  Homicidal Thoughts:  No  Memory:  Immediate;   Good Recent;   Good Remote;   Good  Judgement:  Good  Insight:  Good  Psychomotor Activity:  NA  Concentration:  Concentration: Good and Attention Span: Good  Recall:  Good  Fund of Knowledge:  Good  Language:  Good  Akathisia:  No  Handed:  Right  AIMS (if indicated):     Assets:  Communication Skills Desire for Improvement Housing Resilience Transportation  ADL's:  Intact  Cognition:  WNL  Sleep:   too much      Assessment and Plan: Major depressive disorder, recurrent.  Anxiety.  Patient doing better with a low-dose Abilify.  Continue Abilify 2 mg daily and Lexapro 20 mg daily.  She is tolerating her medication without any side effects.  She is moving to [place] hopefully end of August.  I recommend to keep appointment in 3 months unless she find a new provider in [place] and then she will call us and we  will forward the notes to her new provider.  Patient agreed with the plan.  Recommended to call us back if she has any question or any concern.  She is also need a new PCP in [place].  Follow-up in 3 months  Follow Up Instructions:    I discussed the assessment and treatment plan with the patient. The patient was provided an opportunity to ask questions and all were answered. The patient agreed with the plan and demonstrated an understanding of the instructions.   The patient was advised to call back or seek an in-person evaluation if the symptoms worsen or if the condition fails to improve as anticipated.  Collaboration of Care: Primary Care Provider AEB patient is asked to  schedule appointment with PCP. Patient/Guardian was advised Release of Information must be obtained prior to any record release in order to collaborate their care with an outside provider. Patient/Guardian was advised if they have not already done so to contact the registration department to sign all necessary forms in order for [place] to release information regarding their care.   Consent: Patient/Guardian gives verbal consent for treatment and assignment of benefits for services provided during this visit. Patient/Guardian expressed understanding and agreed to proceed.    I provided 16 minutes of non-face-to-face time during this encounter.   Cleotis Nipper, MD

## 2021-10-18 ENCOUNTER — Telehealth: Payer: Self-pay | Admitting: Family Medicine

## 2021-10-18 NOTE — Telephone Encounter (Signed)
Left message for patient to call back and schedule Medicare Annual Wellness Visit (AWV) either virtually or in office. Left  my jabber number [phone]   Last AWV  10/25/20 ; please schedule at anytime with [facility] Nurse Health Advisor 1 or 2    

## 2021-11-01 ENCOUNTER — Other Ambulatory Visit: Payer: Self-pay | Admitting: Family Medicine

## 2021-11-01 ENCOUNTER — Other Ambulatory Visit: Payer: Self-pay | Admitting: Family

## 2021-11-01 NOTE — Telephone Encounter (Signed)
I will fill but she needs her annual visit scheduled for October. Please have her schedule an appointment. Thanks!

## 2021-11-07 ENCOUNTER — Telehealth: Payer: Self-pay | Admitting: Family Medicine

## 2021-11-07 NOTE — Telephone Encounter (Signed)
Left message for patient to call back and schedule Medicare Annual Wellness Visit (AWV) either virtually or in office. Left  my jabber number [phone]   Last AWV  10/25/20 ; please schedule at anytime with [facility] Nurse Health Advisor 1 or 2    

## 2021-11-22 ENCOUNTER — Telehealth (HOSPITAL_COMMUNITY): Payer: Self-pay | Admitting: Psychiatry

## 2021-11-27 ENCOUNTER — Ambulatory Visit (INDEPENDENT_AMBULATORY_CARE_PROVIDER_SITE_OTHER): Payer: Medicare Other | Admitting: Family Medicine

## 2021-11-27 ENCOUNTER — Encounter: Payer: Self-pay | Admitting: Family Medicine

## 2021-11-27 VITALS — BP 136/70 | HR 50 | Temp 98.4°F | Ht 65.5 in | Wt 156.0 lb

## 2021-11-27 DIAGNOSIS — G2581 Restless legs syndrome: Secondary | ICD-10-CM

## 2021-11-27 DIAGNOSIS — E782 Mixed hyperlipidemia: Secondary | ICD-10-CM

## 2021-11-27 DIAGNOSIS — E559 Vitamin D deficiency, unspecified: Secondary | ICD-10-CM | POA: Diagnosis not present

## 2021-11-27 DIAGNOSIS — K219 Gastro-esophageal reflux disease without esophagitis: Secondary | ICD-10-CM

## 2021-11-27 DIAGNOSIS — F339 Major depressive disorder, recurrent, unspecified: Secondary | ICD-10-CM

## 2021-11-27 DIAGNOSIS — E538 Deficiency of other specified B group vitamins: Secondary | ICD-10-CM

## 2021-11-27 DIAGNOSIS — F419 Anxiety disorder, unspecified: Secondary | ICD-10-CM | POA: Diagnosis not present

## 2021-11-27 DIAGNOSIS — E785 Hyperlipidemia, unspecified: Secondary | ICD-10-CM

## 2021-11-27 DIAGNOSIS — F331 Major depressive disorder, recurrent, moderate: Secondary | ICD-10-CM | POA: Diagnosis not present

## 2021-11-27 DIAGNOSIS — R32 Unspecified urinary incontinence: Secondary | ICD-10-CM | POA: Insufficient documentation

## 2021-11-27 DIAGNOSIS — Z23 Encounter for immunization: Secondary | ICD-10-CM | POA: Diagnosis not present

## 2021-11-27 LAB — LIPID PANEL
Cholesterol: 158 mg/dL (ref 0–200)
HDL: 54.7 mg/dL (ref >39.00)
LDL Cholesterol: 85 mg/dL (ref 0–99)
NonHDL: 103.27
Total CHOL/HDL Ratio: 3
Triglycerides: 93 mg/dL (ref 0.0–149.0)
VLDL: 18.6 mg/dL (ref 0.0–40.0)

## 2021-11-27 LAB — COMPREHENSIVE METABOLIC PANEL
ALT: 9 U/L (ref 0–35)
AST: 15 U/L (ref 0–37)
Albumin: 4.3 g/dL (ref 3.5–5.2)
Alkaline Phosphatase: 68 U/L (ref 39–117)
BUN: 20 mg/dL (ref 6–23)
CO2: 29 mEq/L (ref 19–32)
Calcium: 9.4 mg/dL (ref 8.4–10.5)
Chloride: 105 mEq/L (ref 96–112)
Creatinine, Ser: 0.88 mg/dL (ref 0.40–1.20)
GFR: 62.66 mL/min (ref >60.00)
Glucose, Bld: 84 mg/dL (ref 70–99)
Potassium: 4.2 mEq/L (ref 3.5–5.1)
Sodium: 142 mEq/L (ref 135–145)
Total Bilirubin: 1 mg/dL (ref 0.2–1.2)
Total Protein: 6.9 g/dL (ref 6.0–8.3)

## 2021-11-27 LAB — B12 AND FOLATE PANEL
Folate: >23.9 ng/mL (ref >5.9)
Vitamin B-12: 529 pg/mL (ref 211–911)

## 2021-11-27 LAB — VITAMIN D 25 HYDROXY (VIT D DEFICIENCY, FRACTURES): VITD: 35.84 ng/mL (ref 30.00–100.00)

## 2021-11-27 MED ORDER — OMEPRAZOLE 20 MG PO CPDR: DELAYED_RELEASE_CAPSULE | ORAL | 1 refills | Status: AC

## 2021-11-27 MED ORDER — ARIPIPRAZOLE 2 MG PO TABS: 2.0000 mg | ORAL_TABLET | Freq: Every day | ORAL | 1 refills | Status: DC

## 2021-11-27 MED ORDER — ROPINIROLE HCL 2 MG PO TABS
ORAL_TABLET | ORAL | 0 refills | Status: DC
Start: 2021-11-27 — End: 2022-01-07

## 2021-11-27 MED ORDER — ROSUVASTATIN CALCIUM 20 MG PO TABS: 20.0000 mg | ORAL_TABLET | Freq: Every day | ORAL | 1 refills | Status: DC

## 2021-11-27 MED ORDER — OXYBUTYNIN CHLORIDE 5 MG PO TABS: 5.0000 mg | ORAL_TABLET | Freq: Every day | ORAL | 1 refills | Status: DC

## 2021-11-27 MED ORDER — ESCITALOPRAM OXALATE 20 MG PO TABS: 20.0000 mg | ORAL_TABLET | Freq: Every day | ORAL | 1 refills | Status: DC

## 2021-11-27 NOTE — Assessment & Plan Note (Signed)
Stable sx, continue requip 2 mg at bedtime

## 2021-11-27 NOTE — Assessment & Plan Note (Signed)
I have refilled her medications today, wellbutrin 150 mg and sertraline 100 mg daily, she is moving to [place] today and is having some adjustment stress, I reassured patient and counselled her on coping strategies.

## 2021-11-27 NOTE — Progress Notes (Signed)
Established Patient Office Visit  Subjective   Patient ID: Christina Guerrero, female    DOB: 02/09/1941  Age: 81 y.o. MRN: [id number]  Chief Complaint  Patient presents with   Establish Care    Patient is here for transition of care visit. Patient reports she is moving to [place]. States her daughter lives there, moving to be closer. States that she will be leaving this afternoon and needs refills of her medication.  Depression/anxiety-- pt reports increase in anxiety since she is moving to a new state. We discussed coping skills and ways to control the additional anxiety. She reports that usually her lexapro 20 mg daily and abilify 2 mg daily work well for her symptoms.  HLD-- Needs new lipid panel today and refills of crestor. She denies any muscle cramps, reports compliance with her medication  Restless leg-- on requip 2 mg at night, reports good control of her restless legs at night, no side effects reported.  Urinary incontinence-- on oxybutinin 5 mg daily, states this works well for her, denies side effects, needs refills  GERD-- pt has both B12 and folate deficiency as well as vitamin D deficiency seen on labs last year. She reports she continues to take the supplements daily, we discussed that the omeprazole 20 mg daily could be contributing to her deficiencies, pt state sshe needs to continue the PPI to control her acid reflux symptoms.    Current Outpatient Medications  Medication Instructions   ARIPiprazole (ABILIFY) 2 mg, Oral, Daily   escitalopram (LEXAPRO) 20 mg, Oral, Daily   folic acid (FOLVITE) 1 mg, Oral, Daily   NAPROXEN PO Oral   omeprazole (PRILOSEC) 20 MG capsule TAKE 1 CAPSULE(20 MG) BY MOUTH DAILY   oxybutynin (DITROPAN) 5 mg, Oral, Daily   rOPINIRole (REQUIP) 2 MG tablet TAKE 1 TABLET(2 MG) BY MOUTH AT BEDTIME   rosuvastatin (CRESTOR) 20 mg, Oral, Daily    Patient Active Problem List   Diagnosis Date Noted   HLD (hyperlipidemia) 11/27/2021   Urinary  incontinence 11/27/2021   Episode of recurrent major depressive disorder (HCC) 06/24/2016   GERD (gastroesophageal reflux disease) 05/03/2013   Restless leg syndrome 05/03/2013      Review of Systems  All other systems reviewed and are negative.     Objective:     BP 136/70 (BP Location: Left Arm, Patient Position: Sitting, Cuff Size: Normal)   Pulse (!) 50   Temp 98.4 F (36.9 C) (Oral)   Ht 5' 5.5" (1.664 m)   Wt 156 lb (70.8 kg)   SpO2 98%   BMI 25.56 kg/m    Physical Exam Vitals reviewed.  Constitutional:      Appearance: Normal appearance. She is well-groomed and normal weight.  HENT:     Head: Normocephalic and atraumatic.  Eyes:     Conjunctiva/sclera: Conjunctivae normal.  Neck:     Thyroid: No thyromegaly.  Cardiovascular:     Rate and Rhythm: Normal rate and regular rhythm.     Pulses: Normal pulses.     Heart sounds: S1 normal and S2 normal.  Pulmonary:     Effort: Pulmonary effort is normal.     Breath sounds: Normal breath sounds and air entry.  Abdominal:     General: Bowel sounds are normal.  Musculoskeletal:        General: Normal range of motion.     Cervical back: Normal range of motion and neck supple.     Right lower leg: No edema.  Left lower leg: No edema.  Skin:    General: Skin is warm and dry.  Neurological:     Mental Status: She is alert and oriented to person, place, and time. Mental status is at baseline.     Gait: Gait is intact.  Psychiatric:        Mood and Affect: Mood and affect normal.        Speech: Speech normal.        Behavior: Behavior normal.        Judgment: Judgment normal.       The 10-year ASCVD risk score (Arnett DK, et al., 2019) is: 27%    Assessment & Plan:   Problem List Items Addressed This Visit       Digestive   GERD (gastroesophageal reflux disease)    On omeprazole 20 mg daily, will continue this. I advised patient that she will need to continue B12 and folate supplements for her  deficiencies as long as she is on the PPI since this puts her at risk of vitamin Deficiencies.      Relevant Medications   omeprazole (PRILOSEC) 20 MG capsule     Other   Restless leg syndrome    Stable sx, continue requip 2 mg at bedtime      Episode of recurrent major depressive disorder ([place]) - Primary    I have refilled her medications today, wellbutrin 150 mg and sertraline 100 mg daily, she is moving to [place] today and is having some adjustment stress, I reassured patient and counselled her on coping strategies.       Relevant Medications   escitalopram (LEXAPRO) 20 MG tablet   Urinary incontinence    On oxybutinin 5 mg daily, sx well controlled, will contiinue this medication, rx sent.      Relevant Medications   oxybutynin (DITROPAN) 5 MG tablet   HLD (hyperlipidemia) (Chronic)    She is due for a new lipid panel today, I have ordered this and refilled crestor 20 mg daily.       Relevant Medications   rosuvastatin (CRESTOR) 20 MG tablet   Other Relevant Orders   Lipid Panel (Completed)   Other Visit Diagnoses     Major depressive disorder, recurrent episode, moderate (HCC)       Relevant Medications   ARIPiprazole (ABILIFY) 2 MG tablet   escitalopram (LEXAPRO) 20 MG tablet   Other Relevant Orders   CMP (Completed)   Anxiety       Relevant Medications   ARIPiprazole (ABILIFY) 2 MG tablet   escitalopram (LEXAPRO) 20 MG tablet   RLS (restless legs syndrome)       Relevant Medications   rOPINIRole (REQUIP) 2 MG tablet   B12 deficiency       Relevant Orders   B12 and Folate Panel (Completed)   Folate deficiency       Relevant Orders   B12 and Folate Panel (Completed)   Vitamin D deficiency       Relevant Orders   Vitamin D, 25-hydroxy (Completed)   Need for immunization against influenza       Relevant Orders   Flu Vaccine QUAD High Dose(Fluad) (Completed)      Patient needs new labs today including vitamin levels for her b12, folate and vitamin D  deficiencies. She will take these labs to her new PCP in [place] when she is able to find one.  Return for patient is moving to [place].Farrel Conners, MD

## 2021-11-27 NOTE — Assessment & Plan Note (Signed)
She is due for a new lipid panel today, I have ordered this and refilled crestor 20 mg daily.

## 2021-11-27 NOTE — Assessment & Plan Note (Signed)
On omeprazole 20 mg daily, will continue this. I advised patient that she will need to continue B12 and folate supplements for her deficiencies as long as she is on the PPI since this puts her at risk of vitamin Deficiencies.

## 2021-11-27 NOTE — Patient Instructions (Signed)
My Chart Username is [id number] -- click forgot password to set a new password.

## 2021-11-27 NOTE — Assessment & Plan Note (Signed)
On oxybutinin 5 mg daily, sx well controlled, will contiinue this medication, rx sent.

## 2021-12-04 ENCOUNTER — Telehealth (HOSPITAL_COMMUNITY): Payer: Medicare Other | Admitting: Psychiatry

## 2021-12-08 ENCOUNTER — Other Ambulatory Visit: Payer: Self-pay | Admitting: Family Medicine

## 2021-12-08 DIAGNOSIS — R32 Unspecified urinary incontinence: Secondary | ICD-10-CM

## 2021-12-10 ENCOUNTER — Other Ambulatory Visit: Payer: Self-pay | Admitting: Family Medicine

## 2021-12-10 DIAGNOSIS — R32 Unspecified urinary incontinence: Secondary | ICD-10-CM

## 2022-01-03 DIAGNOSIS — Z23 Encounter for immunization: Secondary | ICD-10-CM | POA: Diagnosis not present

## 2022-01-03 DIAGNOSIS — Z961 Presence of intraocular lens: Secondary | ICD-10-CM | POA: Diagnosis not present

## 2022-01-03 DIAGNOSIS — H26493 Other secondary cataract, bilateral: Secondary | ICD-10-CM | POA: Diagnosis not present

## 2022-01-07 ENCOUNTER — Telehealth (HOSPITAL_COMMUNITY): Payer: Self-pay

## 2022-01-07 DIAGNOSIS — G2581 Restless legs syndrome: Secondary | ICD-10-CM

## 2022-01-07 DIAGNOSIS — E785 Hyperlipidemia, unspecified: Secondary | ICD-10-CM

## 2022-01-07 DIAGNOSIS — F331 Major depressive disorder, recurrent, moderate: Secondary | ICD-10-CM

## 2022-01-07 DIAGNOSIS — F419 Anxiety disorder, unspecified: Secondary | ICD-10-CM

## 2022-01-07 MED ORDER — ESCITALOPRAM OXALATE 20 MG PO TABS: 20.0000 mg | ORAL_TABLET | Freq: Every day | ORAL | 0 refills | Status: AC

## 2022-01-07 MED ORDER — ARIPIPRAZOLE 2 MG PO TABS: 2.0000 mg | ORAL_TABLET | Freq: Every day | ORAL | 0 refills | Status: AC

## 2022-01-07 MED ORDER — ROPINIROLE HCL 2 MG PO TABS: ORAL_TABLET | ORAL | 0 refills | Status: AC

## 2022-01-07 NOTE — Telephone Encounter (Signed)
Sent one-time prescription of Lexapro, Abilify and Requip to the requested pharmacy in [place].  In the future she need to contact with a local provider for refills.

## 2022-01-07 NOTE — Telephone Encounter (Signed)
Received a fax from [facility] in [place] for a refill for Ropinirole 2 mg called patient she stated that she moved to [place] the first week of October and she discussed this with you before she left, she also stated that she is out of medication and can not sleep without it. Please advise.

## 2022-01-08 NOTE — Telephone Encounter (Signed)
Patient voiced understanding.

## 2022-01-11 DIAGNOSIS — H26493 Other secondary cataract, bilateral: Secondary | ICD-10-CM | POA: Diagnosis not present

## 2022-06-04 ENCOUNTER — Telehealth: Payer: Self-pay | Admitting: Family Medicine

## 2022-06-04 NOTE — Telephone Encounter (Signed)
Called patient to schedule Medicare Annual Wellness Visit (AWV). Left message for patient to call back and schedule Medicare Annual Wellness Visit (AWV).  Last date of AWV: 10/25/20  Please schedule an appointment at any time with [facility] or Kriste Basque .  If any questions, please contact me at [phone].  Thank you ,  Rudell Cobb AWV direct phone # [phone]

## 2022-12-15 ENCOUNTER — Other Ambulatory Visit: Payer: Self-pay | Admitting: Family Medicine

## 2022-12-15 DIAGNOSIS — R32 Unspecified urinary incontinence: Secondary | ICD-10-CM

## 2022-12-30 ENCOUNTER — Other Ambulatory Visit: Payer: Self-pay | Admitting: Family Medicine

## 2022-12-30 DIAGNOSIS — E785 Hyperlipidemia, unspecified: Secondary | ICD-10-CM

## 2023-01-31 ENCOUNTER — Other Ambulatory Visit: Payer: Self-pay | Admitting: Family Medicine

## 2023-01-31 DIAGNOSIS — E785 Hyperlipidemia, unspecified: Secondary | ICD-10-CM

## 2023-01-31 NOTE — Telephone Encounter (Signed)
It looks like this patient moved to [place]. In any case she has not been seen in 1 year-- please refuse the rx

## 2023-02-04 NOTE — Telephone Encounter (Signed)
Rx denial sent.

## 2023-06-24 ENCOUNTER — Other Ambulatory Visit (HOSPITAL_BASED_OUTPATIENT_CLINIC_OR_DEPARTMENT_OTHER): Payer: Self-pay
